# Patient Record
Sex: Female | Born: 1970
Health system: Southern US, Community
[De-identification: ages and names within clinical notes are randomized; demographics above are authoritative.]

## PROBLEM LIST (undated history)

## (undated) DIAGNOSIS — O88219 Thromboembolism in pregnancy, unspecified trimester: Secondary | ICD-10-CM

## (undated) DIAGNOSIS — R112 Nausea with vomiting, unspecified: Secondary | ICD-10-CM

## (undated) DIAGNOSIS — J9819 Other pulmonary collapse: Secondary | ICD-10-CM

## (undated) DIAGNOSIS — Z9889 Other specified postprocedural states: Secondary | ICD-10-CM

## (undated) DIAGNOSIS — I82409 Acute embolism and thrombosis of unspecified deep veins of unspecified lower extremity: Secondary | ICD-10-CM

## (undated) HISTORY — DX: Acute embolism and thrombosis of unspecified deep veins of unspecified lower extremity: I82.409

## (undated) HISTORY — DX: Thromboembolism in pregnancy, unspecified trimester: O88.219

## (undated) HISTORY — PX: RIB RESECTION: SHX5077

---

## 2001-08-14 ENCOUNTER — Encounter: Admission: RE | Admit: 2001-08-14 | Discharge: 2001-09-19 | Payer: Self-pay

## 2001-08-29 ENCOUNTER — Other Ambulatory Visit: Admission: RE | Admit: 2001-08-29 | Discharge: 2001-08-29 | Payer: Self-pay | Admitting: Unknown Physician Specialty

## 2002-01-30 ENCOUNTER — Emergency Department (HOSPITAL_COMMUNITY): Admission: EM | Admit: 2002-01-30 | Discharge: 2002-01-31 | Payer: Self-pay | Admitting: Emergency Medicine

## 2002-12-03 ENCOUNTER — Encounter: Payer: Self-pay | Admitting: Emergency Medicine

## 2002-12-03 ENCOUNTER — Emergency Department (HOSPITAL_COMMUNITY): Admission: EM | Admit: 2002-12-03 | Discharge: 2002-12-03 | Payer: Self-pay | Admitting: Emergency Medicine

## 2004-01-07 ENCOUNTER — Emergency Department (HOSPITAL_COMMUNITY): Admission: EM | Admit: 2004-01-07 | Discharge: 2004-01-07 | Payer: Self-pay | Admitting: Emergency Medicine

## 2004-05-01 ENCOUNTER — Emergency Department (HOSPITAL_COMMUNITY): Admission: EM | Admit: 2004-05-01 | Discharge: 2004-05-01 | Payer: Self-pay | Admitting: Emergency Medicine

## 2004-05-13 ENCOUNTER — Ambulatory Visit (HOSPITAL_COMMUNITY): Admission: RE | Admit: 2004-05-13 | Discharge: 2004-05-13 | Payer: Self-pay | Admitting: Internal Medicine

## 2004-06-16 ENCOUNTER — Ambulatory Visit (HOSPITAL_COMMUNITY): Admission: RE | Admit: 2004-06-16 | Discharge: 2004-06-16 | Payer: Self-pay | Admitting: Chiropractic Medicine

## 2011-03-09 ENCOUNTER — Emergency Department (HOSPITAL_COMMUNITY)
Admission: EM | Admit: 2011-03-09 | Discharge: 2011-03-09 | Disposition: A | Discharge status: Home / Self Care | Payer: Self-pay | Attending: Emergency Medicine | Admitting: Emergency Medicine

## 2011-03-09 DIAGNOSIS — J029 Acute pharyngitis, unspecified: Secondary | ICD-10-CM | POA: Insufficient documentation

## 2011-03-09 DIAGNOSIS — R509 Fever, unspecified: Secondary | ICD-10-CM | POA: Insufficient documentation

## 2011-03-09 LAB — RAPID STREP SCREEN (MED CTR MEBANE ONLY): Streptococcus, Group A Screen (Direct): NEGATIVE

## 2011-12-24 ENCOUNTER — Encounter (HOSPITAL_COMMUNITY): Payer: Self-pay

## 2011-12-24 ENCOUNTER — Emergency Department (HOSPITAL_COMMUNITY)
Admission: EM | Admit: 2011-12-24 | Discharge: 2011-12-24 | Disposition: A | Discharge status: Home / Self Care | Payer: Self-pay | Source: Home / Self Care

## 2011-12-24 ENCOUNTER — Emergency Department (INDEPENDENT_AMBULATORY_CARE_PROVIDER_SITE_OTHER): Payer: Self-pay

## 2011-12-24 DIAGNOSIS — S139XXA Sprain of joints and ligaments of unspecified parts of neck, initial encounter: Secondary | ICD-10-CM

## 2011-12-24 DIAGNOSIS — S161XXA Strain of muscle, fascia and tendon at neck level, initial encounter: Secondary | ICD-10-CM

## 2011-12-24 MED ORDER — PREDNISONE 10 MG PO TABS: ORAL_TABLET | ORAL | Status: DC

## 2011-12-24 MED ORDER — CYCLOBENZAPRINE HCL 10 MG PO TABS
10.0000 mg | ORAL_TABLET | Freq: Two times a day (BID) | ORAL | Status: AC | PRN
Start: 2011-12-24 — End: 2012-01-03

## 2011-12-24 MED ORDER — MELOXICAM 15 MG PO TABS: 15.0000 mg | ORAL_TABLET | Freq: Every day | ORAL | Status: DC

## 2011-12-24 MED ORDER — ACETAMINOPHEN 500 MG PO TABS: 500.0000 mg | ORAL_TABLET | Freq: Four times a day (QID) | ORAL | Status: AC | PRN
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
## 2011-12-24 NOTE — Discharge Instructions (Signed)
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
## 2012-02-22 ENCOUNTER — Emergency Department (HOSPITAL_COMMUNITY)
Admission: EM | Admit: 2012-02-22 | Discharge: 2012-02-22 | Disposition: A | Discharge status: Home / Self Care | Payer: Self-pay | Attending: Emergency Medicine | Admitting: Emergency Medicine

## 2012-02-22 ENCOUNTER — Encounter (HOSPITAL_COMMUNITY): Payer: Self-pay | Admitting: *Deleted

## 2012-02-22 DIAGNOSIS — N390 Urinary tract infection, site not specified: Secondary | ICD-10-CM | POA: Insufficient documentation

## 2012-02-22 LAB — URINALYSIS, ROUTINE W REFLEX MICROSCOPIC
Bilirubin Urine: NEGATIVE
Glucose, UA: NEGATIVE mg/dL
Ketones, ur: NEGATIVE mg/dL
Nitrite: NEGATIVE
Protein, ur: NEGATIVE mg/dL
Specific Gravity, Urine: 1.031 — ABNORMAL HIGH (ref 1.005–1.030)
Urobilinogen, UA: 1 mg/dL (ref 0.0–1.0)
pH: 6 (ref 5.0–8.0)

## 2012-02-22 LAB — URINE MICROSCOPIC-ADD ON

## 2012-02-22 LAB — WET PREP, GENITAL
Trich, Wet Prep: NONE SEEN
Yeast Wet Prep HPF POC: NONE SEEN

## 2012-02-22 MED ORDER — CEPHALEXIN 500 MG PO CAPS: 500.0000 mg | ORAL_CAPSULE | Freq: Four times a day (QID) | ORAL | Status: AC

## 2012-02-22 MED ORDER — CEFTRIAXONE SODIUM 1 G IJ SOLR
1.0000 g | Freq: Once | INTRAMUSCULAR | Status: AC
  Administered 2012-02-22: 1 g via INTRAMUSCULAR
  Filled 2012-02-22: qty 10

## 2012-02-22 MED ORDER — IBUPROFEN 800 MG PO TABS
800.0000 mg | ORAL_TABLET | Freq: Once | ORAL | Status: AC
  Administered 2012-02-22: 800 mg via ORAL
  Filled 2012-02-22: qty 1

## 2012-02-22 NOTE — ED Notes (Signed)
Per Pt:  Pt st's she's having urinary frequency and vaginal itching, also st's she's had some yellow discharge.  St's this has been going on for about 1 week.

## 2012-02-22 NOTE — ED Provider Notes (Signed)
History     CSN: [id number]  Arrival date & time 02/22/12  2021   First MD Initiated Contact with Patient 02/22/12 2103      No chief complaint on file.   (Consider location/radiation/quality/duration/timing/severity/associated sxs/prior treatment) HPI Comments: Patient with a significant past medical history presents emergency department with chief complaint of vaginal itching, dysuria, and urinary frequency.  Patient states her last menstrual period is due but denies being sexually active.  The history is provided by the patient.    History reviewed. No pertinent past medical history.  Past Surgical History  Procedure Date  . Rib resection     No family history on file.  History  Substance Use Topics  . Smoking status: Never Smoker   . Smokeless tobacco: Not on file  . Alcohol Use: Yes    OB History    Grav Para Term Preterm Abortions TAB SAB Ect Mult Living                  Review of Systems  Constitutional: Negative for fever, chills and appetite change.  HENT: Negative for congestion.   Eyes: Negative for visual disturbance.  Respiratory: Negative for shortness of breath.   Cardiovascular: Negative for chest pain and leg swelling.  Gastrointestinal: Negative for nausea, vomiting and abdominal pain.  Genitourinary: Positive for dysuria, urgency and frequency. Negative for vaginal bleeding, vaginal discharge, vaginal pain and dyspareunia.  Neurological: Negative for dizziness, syncope, weakness, light-headedness, numbness and headaches.  Psychiatric/Behavioral: Negative for confusion.  All other systems reviewed and are negative.    Allergies  Review of patient's allergies indicates no known allergies.  Home Medications  No current outpatient prescriptions on file.  There were no vitals taken for this visit.  Physical Exam  Nursing note and vitals reviewed. Constitutional: She is oriented to person, place, and time. She appears well-developed and  well-nourished. No distress.  HENT:  Head: Normocephalic and atraumatic.  Eyes: Conjunctivae and EOM are normal.  Neck: Normal range of motion.  Pulmonary/Chest: Effort normal.  Genitourinary:       Pelvic exam: normal external genitalia, vulva, vagina, cervix, uterus and adnexa.   Musculoskeletal: Normal range of motion.  Neurological: She is alert and oriented to person, place, and time.  Skin: Skin is warm and dry. No rash noted. She is not diaphoretic.  Psychiatric: She has a normal mood and affect. Her behavior is normal.    ED Course  Procedures (including critical care time)  Labs Reviewed  URINALYSIS, ROUTINE W REFLEX MICROSCOPIC - Abnormal; Notable for the following:    APPearance CLOUDY (*)     Specific Gravity, Urine 1.031 (*)     Hgb urine dipstick LARGE (*)     Leukocytes, UA MODERATE (*)     All other components within normal limits  WET PREP, GENITAL - Abnormal; Notable for the following:    Clue Cells Wet Prep HPF POC RARE (*)     WBC, Wet Prep HPF POC TOO NUMEROUS TO COUNT (*)     All other components within normal limits  URINE MICROSCOPIC-ADD ON - Abnormal; Notable for the following:    Squamous Epithelial / LPF FEW (*)     Bacteria, UA FEW (*)     All other components within normal limits  GC/CHLAMYDIA PROBE AMP, GENITAL   No results found.   No diagnosis found.    MDM  UTI  Pt has been diagnosed with a UTI. Pt is afebrile, no CVA tenderness, normotensive,  and denies N/V. Pt to be dc home with antibiotics and instructions to follow up with PCP if symptoms persist.         Jaci Carrel, PA-C 02/22/12 2307

## 2012-02-22 NOTE — ED Notes (Signed)
Pelvic setup in room 

## 2012-02-23 LAB — GC/CHLAMYDIA PROBE AMP, GENITAL
Chlamydia, DNA Probe: NEGATIVE
GC Probe Amp, Genital: NEGATIVE

## 2012-02-23 NOTE — ED Provider Notes (Signed)
Medical screening examination/treatment/procedure(s) were performed by non-physician practitioner and as supervising physician I was immediately available for consultation/collaboration.  Derwood Kaplan, MD 02/23/12 [phone]

## 2012-07-20 ENCOUNTER — Emergency Department (HOSPITAL_COMMUNITY): Payer: Self-pay

## 2012-07-20 ENCOUNTER — Emergency Department (HOSPITAL_COMMUNITY)
Admission: EM | Admit: 2012-07-20 | Discharge: 2012-07-20 | Disposition: A | Discharge status: Home / Self Care | Payer: Self-pay | Attending: Emergency Medicine | Admitting: Emergency Medicine

## 2012-07-20 ENCOUNTER — Encounter (HOSPITAL_COMMUNITY): Payer: Self-pay | Admitting: Emergency Medicine

## 2012-07-20 DIAGNOSIS — J189 Pneumonia, unspecified organism: Secondary | ICD-10-CM | POA: Insufficient documentation

## 2012-07-20 DIAGNOSIS — R079 Chest pain, unspecified: Secondary | ICD-10-CM | POA: Insufficient documentation

## 2012-07-20 LAB — CBC
MCV: 66.5 fL — ABNORMAL LOW (ref 78.0–100.0)
Platelets: 307 10*3/uL (ref 150–400)
RBC: 5.07 MIL/uL (ref 3.87–5.11)
RDW: 17.8 % — ABNORMAL HIGH (ref 11.5–15.5)
WBC: 7.4 10*3/uL (ref 4.0–10.5)

## 2012-07-20 LAB — BASIC METABOLIC PANEL WITH GFR
BUN: 11 mg/dL (ref 6–23)
CO2: 24 meq/L (ref 19–32)
Calcium: 8.9 mg/dL (ref 8.4–10.5)
Chloride: 104 meq/L (ref 96–112)
Creatinine, Ser: 0.87 mg/dL (ref 0.50–1.10)
GFR calc Af Amer: >90 mL/min (ref >90)
GFR calc non Af Amer: 82 mL/min — ABNORMAL LOW (ref >90)
Glucose, Bld: 85 mg/dL (ref 70–99)
Potassium: 3.8 meq/L (ref 3.5–5.1)
Sodium: 136 meq/L (ref 135–145)

## 2012-07-20 MED ORDER — SODIUM CHLORIDE 0.9 % IV BOLUS (SEPSIS)
1000.0000 mL | Freq: Once | INTRAVENOUS | Status: AC
  Administered 2012-07-20: 1000 mL via INTRAVENOUS

## 2012-07-20 MED ORDER — ALBUTEROL SULFATE (5 MG/ML) 0.5% IN NEBU
5.0000 mg | INHALATION_SOLUTION | Freq: Once | RESPIRATORY_TRACT | Status: AC
  Administered 2012-07-20: 5 mg via RESPIRATORY_TRACT
  Filled 2012-07-20: qty 1

## 2012-07-20 MED ORDER — ALBUTEROL SULFATE HFA 108 (90 BASE) MCG/ACT IN AERS
2.0000 | INHALATION_SPRAY | Freq: Once | RESPIRATORY_TRACT | Status: AC
  Administered 2012-07-20: 2 via RESPIRATORY_TRACT
  Filled 2012-07-20: qty 6.7

## 2012-07-20 MED ORDER — DEXTROSE 5 % IV SOLN
500.0000 mg | Freq: Once | INTRAVENOUS | Status: AC
  Administered 2012-07-20: 500 mg via INTRAVENOUS
  Filled 2012-07-20: qty 500

## 2012-07-20 MED ORDER — AZITHROMYCIN 250 MG PO TABS: ORAL_TABLET | ORAL | Status: DC

## 2012-07-20 MED ORDER — ONDANSETRON HCL 4 MG/2ML IJ SOLN
4.0000 mg | Freq: Once | INTRAMUSCULAR | Status: AC
  Administered 2012-07-20: 4 mg via INTRAVENOUS
  Filled 2012-07-20: qty 2

## 2012-07-20 MED ORDER — MORPHINE SULFATE 4 MG/ML IJ SOLN
6.0000 mg | Freq: Once | INTRAMUSCULAR | Status: AC
  Administered 2012-07-20: 6 mg via INTRAVENOUS
  Filled 2012-07-20: qty 2

## 2012-07-20 MED ORDER — PREDNISONE 20 MG PO TABS: ORAL_TABLET | ORAL | Status: DC

## 2012-07-20 MED ORDER — DEXTROSE 5 % IV SOLN
1.0000 g | Freq: Once | INTRAVENOUS | Status: AC
  Administered 2012-07-20: 1 g via INTRAVENOUS
  Filled 2012-07-20: qty 10

## 2012-07-20 MED ORDER — OXYCODONE-ACETAMINOPHEN 5-325 MG PO TABS: ORAL_TABLET | ORAL | Status: DC

## 2012-07-20 MED ORDER — IOHEXOL 350 MG/ML SOLN
100.0000 mL | Freq: Once | INTRAVENOUS | Status: AC | PRN
  Administered 2012-07-20: 100 mL via INTRAVENOUS

## 2012-07-20 NOTE — ED Provider Notes (Signed)
History     CSN: [id number]  Arrival date & time 07/20/12  1606   First MD Initiated Contact with Patient 07/20/12 1706      Chief Complaint  Patient presents with  . Shortness of Breath    (Consider location/radiation/quality/duration/timing/severity/associated sxs/prior treatment) HPI  Donna [name] is a 42 y.o. female complaining of shortness of breath and right anterior chest pain onset yesterday morning. Patient denies cough, fever, abdominal pain, nausea vomiting, change in bowel or bladder habits. Patient denies taking any exogenous estrogen, recent immobilization. Pain is rated at 7/10 described as sharp and exacerbated by deep breathing and movement.   History reviewed. No pertinent past medical history.  Past Surgical History  Procedure Date  . Rib resection     History reviewed. No pertinent family history.  History  Substance Use Topics  . Smoking status: Never Smoker   . Smokeless tobacco: Not on file  . Alcohol Use: Yes    OB History    Grav Para Term Preterm Abortions TAB SAB Ect Mult Living                  Review of Systems  Constitutional: Negative for fever.  Respiratory: Positive for shortness of breath.   Cardiovascular: Positive for chest pain.  Gastrointestinal: Negative for nausea, vomiting, abdominal pain and diarrhea.  All other systems reviewed and are negative.    Allergies  Review of patient's allergies indicates no known allergies.  Home Medications   Current Outpatient Rx  Name  Route  Sig  Dispense  Refill  . IBUPROFEN 200 MG PO TABS   Oral   Take 200 mg by mouth every 6 (six) hours as needed. Pain         . PSEUDOEPHEDRINE-ACETAMINOPHEN 30-500 MG PO TABS   Oral   Take 1 tablet by mouth every 4 (four) hours as needed. Congestion           BP 132/74  Pulse 89  Temp 98.4 F (36.9 C) (Oral)  Resp 20  Ht 5\' 10"  (1.778 m)  Wt 266 lb 6.4 oz (120.838 kg)  BMI 38.22 kg/m2  SpO2 100%  LMP 06/24/2012  Physical  Exam  Nursing note and vitals reviewed. Constitutional: She is oriented to person, place, and time. She appears well-developed and well-nourished. No distress.       Obese   HENT:  Head: Normocephalic.  Eyes: Conjunctivae normal and EOM are normal.  Neck: Normal range of motion.  Cardiovascular:       Tachy to the 120's  Pulmonary/Chest: Effort normal and breath sounds normal. No stridor. No respiratory distress. She has no wheezes. She has no rales. She exhibits no tenderness.  Abdominal: Soft. There is no tenderness.  Musculoskeletal: Normal range of motion.  Neurological: She is alert and oriented to person, place, and time.  Psychiatric: She has a normal mood and affect.    ED Course  Procedures (including critical care time)  Labs Reviewed  BASIC METABOLIC PANEL - Abnormal; Notable for the following:    GFR calc non Af Amer 82 (*)     All other components within normal limits  CBC - Abnormal; Notable for the following:    Hemoglobin 10.5 (*)     HCT 33.7 (*)     MCV 66.5 (*)     MCH 20.7 (*)     RDW 17.8 (*)     All other components within normal limits  D-DIMER, QUANTITATIVE - Abnormal; Notable for  the following:    D-Dimer, Quant 1.39 (*)     All other components within normal limits   Dg Chest 2 View (if Patient Has Fever And/or Copd)  07/20/2012  *RADIOLOGY REPORT*  Clinical Data: Left-sided chest pain.  Shortness of breath.  Prior history of left first rib resection and spontaneous pneumothorax approximate 10 years ago.  CHEST - 2 VIEW  Comparison: Two-view chest x-ray 01/07/2004.  Findings: Cardiomediastinal silhouette unremarkable, unchanged. Streaky opacities in the lower lobes and right middle lobe.  Lungs otherwise clear.  Surgically absent first rib.  Visualized bony thorax otherwise intact.  IMPRESSION: Streaky atelectasis versus bronchopneumonia involving the lower lobes and right middle lobe.   Original Report Authenticated By: Hulan Saas, M.D.    Ct  Angio Chest Pe W/cm &/or Wo Cm  07/20/2012  *RADIOLOGY REPORT*  Clinical Data: Short of breath with right chest pain.  CT ANGIOGRAPHY CHEST  Technique:  Multidetector CT imaging of the chest using the standard protocol during bolus administration of intravenous contrast. Multiplanar reconstructed images including MIPs were obtained and reviewed to evaluate the vascular anatomy.  Contrast: OMNIPAQUE IOHEXOL 350 MG/ML SOLN  Comparison: Chest x-ray 08/09/2012  Findings: Right lower lobe infiltrate, likely pneumonia.  Small right pleural effusion.  The left lung is clear.  Negative for pulmonary embolism.  There is gas in the main pulmonary artery from recent IV start. The aorta is not well opacified to evaluate for dissection.  No aneurysm is present.  Heart size is normal and there is no pericardial effusion.  IMPRESSION: Right lower lobe infiltrate and effusion consistent with pneumonia.  Negative for pulmonary embolism.   Original Report Authenticated By: Janeece Riggers, M.D.      1. Community acquired pneumonia       MDM  Patient with acute onset of chest pain and shortness of breath. She denies fever, cough or other signs of infection. D-dimer is ordered to rule out PE  Chest x-ray shows streaky atelectasis versus bronchopneumonia. D-dimer is elevated. CTA is ordered.  His CTA shows no pulmonary embolism I will treat her for pneumonia with IV Rocephin and azithromycin.    Pt verbalized understanding and agrees with care plan. Outpatient follow-up and return precautions given.    New Prescriptions   AZITHROMYCIN (ZITHROMAX Z-PAK) 250 MG TABLET    2 po day one, then 1 daily x 4 days   OXYCODONE-ACETAMINOPHEN (PERCOCET/ROXICET) 5-325 MG PER TABLET    1 to 2 tabs PO q6hrs  PRN for pain   PREDNISONE (DELTASONE) 20 MG TABLET    2 tabs po daily x 4 days           Wynetta Emery, PA-C 07/22/12 2140

## 2012-07-20 NOTE — ED Notes (Signed)
Patient c/o shortness of breath since yesterday morning.  Patient c/o right sided rib cage and shortness of breath.  Patient denies fevers or cough.

## 2012-07-20 NOTE — ED Notes (Signed)
[id number]> Expected date:<BR> Expected time:<BR> Means of arrival:<BR> Comments:<BR> EMS - [place] pt just discharged

## 2012-07-23 NOTE — ED Provider Notes (Signed)
Medical screening examination/treatment/procedure(s) were performed by non-physician practitioner and as supervising physician I was immediately available for consultation/collaboration.    Celene Kras, MD 07/23/12 [phone]

## 2012-12-04 ENCOUNTER — Encounter (HOSPITAL_COMMUNITY): Payer: Self-pay | Admitting: *Deleted

## 2012-12-04 ENCOUNTER — Emergency Department (HOSPITAL_COMMUNITY): Payer: Self-pay

## 2012-12-04 ENCOUNTER — Inpatient Hospital Stay (HOSPITAL_COMMUNITY)
Admission: EM | Admit: 2012-12-04 | Discharge: 2012-12-11 | DRG: 176 | Disposition: A | Discharge status: Home / Self Care | Payer: MEDICAID | Attending: Internal Medicine | Admitting: Internal Medicine

## 2012-12-04 DIAGNOSIS — R894 Abnormal immunological findings in specimens from other organs, systems and tissues: Secondary | ICD-10-CM | POA: Diagnosis present

## 2012-12-04 DIAGNOSIS — I82401 Acute embolism and thrombosis of unspecified deep veins of right lower extremity: Secondary | ICD-10-CM

## 2012-12-04 DIAGNOSIS — D649 Anemia, unspecified: Secondary | ICD-10-CM | POA: Diagnosis present

## 2012-12-04 DIAGNOSIS — D509 Iron deficiency anemia, unspecified: Secondary | ICD-10-CM | POA: Diagnosis present

## 2012-12-04 DIAGNOSIS — S93401A Sprain of unspecified ligament of right ankle, initial encounter: Secondary | ICD-10-CM

## 2012-12-04 DIAGNOSIS — M249 Joint derangement, unspecified: Secondary | ICD-10-CM | POA: Diagnosis present

## 2012-12-04 DIAGNOSIS — I82409 Acute embolism and thrombosis of unspecified deep veins of unspecified lower extremity: Secondary | ICD-10-CM | POA: Diagnosis present

## 2012-12-04 DIAGNOSIS — Z86711 Personal history of pulmonary embolism: Secondary | ICD-10-CM | POA: Diagnosis present

## 2012-12-04 DIAGNOSIS — S93401D Sprain of unspecified ligament of right ankle, subsequent encounter: Secondary | ICD-10-CM

## 2012-12-04 DIAGNOSIS — M24176 Other articular cartilage disorders, unspecified foot: Secondary | ICD-10-CM | POA: Diagnosis present

## 2012-12-04 DIAGNOSIS — I2699 Other pulmonary embolism without acute cor pulmonale: Principal | ICD-10-CM | POA: Diagnosis present

## 2012-12-04 HISTORY — DX: Other pulmonary collapse: J98.19

## 2012-12-04 LAB — POCT I-STAT TROPONIN I: Troponin i, poc: 0 ng/mL (ref 0.00–0.08)

## 2012-12-04 NOTE — ED Notes (Signed)
Pt c/o shortness of breath x 2 days; worse yesterday; pain with breathing; feels like can't breathe when she is laying down; denies cough; pain to left axilla area when breathing; no fever; pt fell Saturday night hurting right ankle; swelling; pain with ambulation

## 2012-12-04 NOTE — ED Provider Notes (Signed)
History     CSN: [id number]  Arrival date & time 12/04/12  2050   First MD Initiated Contact with Patient 12/04/12 2336      Chief Complaint  Patient presents with  . Shortness of Breath    (Consider location/radiation/quality/duration/timing/severity/associated sxs/prior treatment) HPI Comments: Patient comes to the ER for evaluation of left-sided chest pain and shortness of breath. Symptoms began 2 days ago. Patient reports that she has a sharp pain in the left ribs that hurts when she moves, bends, twists as well as takes a deep breath. This is causing her to feel short of breath. She says it feels like when she had pneumonia in the past. She has not had any fever and there is minimal cough.  She does report a fall 4 nights ago. She injured her right ankle when she fell. She reports swelling and pain with walking. She does not think there was any other injury, including the chest.  Patient is a 42 y.o. female presenting with shortness of breath.  Shortness of Breath Associated symptoms: chest pain     Past Medical History  Diagnosis Date  . Lung collapse     Past Surgical History  Procedure Laterality Date  . Rib resection      No family history on file.  History  Substance Use Topics  . Smoking status: Never Smoker   . Smokeless tobacco: Not on file  . Alcohol Use: Yes    OB History   Grav Para Term Preterm Abortions TAB SAB Ect Mult Living                  Review of Systems  Respiratory: Positive for shortness of breath.   Cardiovascular: Positive for chest pain.  Musculoskeletal: Positive for arthralgias.  All other systems reviewed and are negative.    Allergies  Review of patient's allergies indicates no known allergies.  Home Medications   Current Outpatient Rx  Name  Route  Sig  Dispense  Refill  . azithromycin (ZITHROMAX Z-PAK) 250 MG tablet      2 po day one, then 1 daily x 4 days   5 tablet   0   . ibuprofen (ADVIL,MOTRIN) 200 MG  tablet   Oral   Take 200 mg by mouth every 6 (six) hours as needed. Pain         . oxyCODONE-acetaminophen (PERCOCET/ROXICET) 5-325 MG per tablet      1 to 2 tabs PO q6hrs  PRN for pain   15 tablet   0   . predniSONE (DELTASONE) 20 MG tablet      2 tabs po daily x 4 days   8 tablet   0   . pseudoephedrine-acetaminophen (TYLENOL SINUS) 30-500 MG TABS   Oral   Take 1 tablet by mouth every 4 (four) hours as needed. Congestion           BP 111/62  Pulse 107  Temp(Src) 99.2 F (37.3 C)  Resp 24  SpO2 100%  LMP 11/14/2012  Physical Exam  Constitutional: She is oriented to person, place, and time. She appears well-developed and well-nourished. No distress.  HENT:  Head: Normocephalic and atraumatic.  Right Ear: Hearing normal.  Left Ear: Hearing normal.  Nose: Nose normal.  Mouth/Throat: Oropharynx is clear and moist and mucous membranes are normal.  Eyes: Conjunctivae and EOM are normal. Pupils are equal, round, and reactive to light.  Neck: Normal range of motion. Neck supple.  Cardiovascular: Regular rhythm, S1  normal and S2 normal.  Exam reveals no gallop and no friction rub.   No murmur heard. Pulmonary/Chest: Effort normal and breath sounds normal. No respiratory distress. She exhibits tenderness. She exhibits no crepitus.    Abdominal: Soft. Normal appearance and bowel sounds are normal. There is no hepatosplenomegaly. There is no tenderness. There is no rebound, no guarding, no tenderness at McBurney's point and negative Murphy's sign. No hernia.  Musculoskeletal: Normal range of motion.       Right ankle: She exhibits swelling. She exhibits no deformity. Tenderness. Lateral malleolus tenderness found.  Neurological: She is alert and oriented to person, place, and time. She has normal strength. No cranial nerve deficit or sensory deficit. Coordination normal. GCS eye subscore is 4. GCS verbal subscore is 5. GCS motor subscore is 6.  Skin: Skin is warm, dry and  intact. No rash noted. No cyanosis.  Psychiatric: She has a normal mood and affect. Her speech is normal and behavior is normal. Thought content normal.    ED Course  Procedures (including critical care time)  Labs Reviewed  POCT I-STAT TROPONIN I   Dg Chest 2 View  12/04/2012   *RADIOLOGY REPORT*  Clinical Data: Left-sided chest pain and shortness of breath.  CHEST - 2 VIEW  Comparison: 07/20/2012.  Findings: The cardiac silhouette, mediastinal and hilar contours are normal and stable.  Mild bronchitic lung changes and left greater than right basilar atelectasis.  No focal airspace consolidation or pleural effusion.  IMPRESSION: Bronchitic type lung changes and bibasilar atelectasis, left greater than right.   Original Report Authenticated By: Rudie Meyer, M.D.   Dg Ankle Complete Right  12/04/2012   *RADIOLOGY REPORT*  Clinical Data: Injured right ankle.  RIGHT ANKLE - COMPLETE 3+ VIEW  Comparison: None  Findings: The ankle mortise is maintained.  No acute ankle fracture.  No osteochondral lesion.  No joint effusion.  The mid and hind foot bony structures are intact.  IMPRESSION: No acute bony findings.   Original Report Authenticated By: Rudie Meyer, M.D.   Ct Angio Chest Pe W/cm &/or Wo Cm  12/05/2012   *RADIOLOGY REPORT*  Clinical Data: Chest pain and shortness of breath.  CT ANGIOGRAPHY CHEST  Technique:  Multidetector CT imaging of the chest using the standard protocol during bolus administration of intravenous contrast. Multiplanar reconstructed images including MIPs were obtained and reviewed to evaluate the vascular anatomy.  Contrast: OMNIPAQUE IOHEXOL 350 MG/ML SOLN  Comparison: CT chest 07/20/2012.  Findings: The chest wall is unremarkable and stable.  No breast masses, supraclavicular or axillary adenopathy.  Small scattered benign appearing fatty lymph nodes are noted.  The bony thorax is intact.  The heart is normal in size.  No pericardial effusion.  No mediastinal or hilar  lymphadenopathy.  Small scattered lymph nodes are noted.  The esophagus is grossly normal. A small hiatal hernia is noted.  The aorta is normal in caliber. No dissection.  The pulmonary arterial tree is fairly well opacified.  There are bilateral pulmonary emboli.  Examination of the lung parenchyma demonstrates patchy subsegmental bibasilar atelectasis.  No worrisome mass lesions or bronchiectasis.  The upper abdomen is unremarkable.  IMPRESSION:  1.  Small bilateral pulmonary emboli. 2.  Normal thoracic aorta. 3.  Patchy bibasilar subsegmental atelectasis.   Original Report Authenticated By: Rudie Meyer, M.D.     Diagnosis: 1. Bilateral PE 2. Ankle sprain    MDM  Patient comes to the ER for evaluation of 2 separate problems. Patient reports  that she has been experiencing shortness of breath for the last 2 days. She reports the pain is on the left side under the breast and hurts more when she takes a deep breath. He feels worse when she lies down. She has not had any cough. Patient denies fever. Workup is consistent with bilateral pulmonary emboli causing the symptoms.  Patient also complaining of right ankle injury from a fall. She fell 4 nights ago. Patient twisted her right ankle when she fell. She still has some swelling outside portion of the ankle. X-ray was negative.  Patient be admitted to the hospital for initial anticoagulation treatment for bilateral PE.        Gilda Crease, MD 12/05/12 [phone]

## 2012-12-05 ENCOUNTER — Encounter (HOSPITAL_COMMUNITY): Payer: Self-pay

## 2012-12-05 ENCOUNTER — Emergency Department (HOSPITAL_COMMUNITY): Payer: Self-pay

## 2012-12-05 DIAGNOSIS — Z86711 Personal history of pulmonary embolism: Secondary | ICD-10-CM | POA: Diagnosis present

## 2012-12-05 DIAGNOSIS — I2699 Other pulmonary embolism without acute cor pulmonale: Secondary | ICD-10-CM

## 2012-12-05 DIAGNOSIS — S93401A Sprain of unspecified ligament of right ankle, initial encounter: Secondary | ICD-10-CM

## 2012-12-05 DIAGNOSIS — S93409A Sprain of unspecified ligament of unspecified ankle, initial encounter: Secondary | ICD-10-CM

## 2012-12-05 DIAGNOSIS — D649 Anemia, unspecified: Secondary | ICD-10-CM | POA: Diagnosis present

## 2012-12-05 LAB — POCT I-STAT, CHEM 8
BUN: 16 mg/dL (ref 6–23)
Calcium, Ion: 1.15 mmol/L (ref 1.12–1.23)
HCT: 32 % — ABNORMAL LOW (ref 36.0–46.0)
Hemoglobin: 10.9 g/dL — ABNORMAL LOW (ref 12.0–15.0)
Sodium: 136 mEq/L (ref 135–145)
TCO2: 24 mmol/L (ref 0–100)

## 2012-12-05 LAB — PROTIME-INR
INR: 1.07 (ref 0.00–1.49)
Prothrombin Time: 13.8 seconds (ref 11.6–15.2)

## 2012-12-05 LAB — COMPREHENSIVE METABOLIC PANEL
AST: 8 U/L (ref 0–37)
Albumin: 2.4 g/dL — ABNORMAL LOW (ref 3.5–5.2)
BUN: 10 mg/dL (ref 6–23)
Calcium: 8.2 mg/dL — ABNORMAL LOW (ref 8.4–10.5)
Creatinine, Ser: 0.87 mg/dL (ref 0.50–1.10)
GFR calc non Af Amer: 82 mL/min — ABNORMAL LOW (ref >90)

## 2012-12-05 LAB — RETICULOCYTES: Retic Ct Pct: 1.7 % (ref 0.4–3.1)

## 2012-12-05 LAB — HEPARIN LEVEL (UNFRACTIONATED)
Heparin Unfractionated: 0.2 IU/mL — ABNORMAL LOW (ref 0.30–0.70)
Heparin Unfractionated: 0.26 IU/mL — ABNORMAL LOW (ref 0.30–0.70)

## 2012-12-05 LAB — ANTITHROMBIN III: AntiThromb III Func: 77 % (ref 75–120)

## 2012-12-05 LAB — HOMOCYSTEINE: Homocysteine: 5.1 umol/L (ref 4.0–15.4)

## 2012-12-05 LAB — CBC
Hemoglobin: 8.3 g/dL — ABNORMAL LOW (ref 12.0–15.0)
MCH: 19.3 pg — ABNORMAL LOW (ref 26.0–34.0)
MCV: 63.5 fL — ABNORMAL LOW (ref 78.0–100.0)
RBC: 4.3 MIL/uL (ref 3.87–5.11)
WBC: 8.8 10*3/uL (ref 4.0–10.5)

## 2012-12-05 LAB — POCT I-STAT TROPONIN I: Troponin i, poc: 0.01 ng/mL (ref 0.00–0.08)

## 2012-12-05 LAB — APTT: aPTT: 32 seconds (ref 24–37)

## 2012-12-05 LAB — PRO B NATRIURETIC PEPTIDE: Pro B Natriuretic peptide (BNP): 38.4 pg/mL (ref 0–125)

## 2012-12-05 LAB — FERRITIN: Ferritin: 10 ng/mL (ref 10–291)

## 2012-12-05 MED ORDER — SODIUM CHLORIDE 0.9 % IV SOLN
INTRAVENOUS | Status: DC
  Administered 2012-12-05: 07:00:00 via INTRAVENOUS

## 2012-12-05 MED ORDER — COUMADIN BOOK
Freq: Once | Status: AC
  Administered 2012-12-05: 16:00:00
  Filled 2012-12-05 (×2): qty 1

## 2012-12-05 MED ORDER — HEPARIN (PORCINE) IN NACL 100-0.45 UNIT/ML-% IJ SOLN
1400.0000 [IU]/h | INTRAMUSCULAR | Status: DC
  Administered 2012-12-05: 1400 [IU]/h via INTRAVENOUS
  Filled 2012-12-05 (×2): qty 250

## 2012-12-05 MED ORDER — HEPARIN BOLUS VIA INFUSION
5000.0000 [IU] | Freq: Once | INTRAVENOUS | Status: AC
  Administered 2012-12-05: 5000 [IU] via INTRAVENOUS
  Filled 2012-12-05: qty 5000

## 2012-12-05 MED ORDER — HYDROCODONE-ACETAMINOPHEN 5-325 MG PO TABS
1.0000 | ORAL_TABLET | ORAL | Status: DC | PRN
  Administered 2012-12-05: 1 via ORAL
  Administered 2012-12-05 – 2012-12-06 (×4): 2 via ORAL
  Filled 2012-12-05 (×4): qty 2
  Filled 2012-12-05: qty 1
  Filled 2012-12-05: qty 2

## 2012-12-05 MED ORDER — SODIUM CHLORIDE 0.9 % IV SOLN
INTRAVENOUS | Status: DC
  Administered 2012-12-05: 05:00:00 via INTRAVENOUS

## 2012-12-05 MED ORDER — HEPARIN (PORCINE) IN NACL 100-0.45 UNIT/ML-% IJ SOLN
1650.0000 [IU]/h | INTRAMUSCULAR | Status: AC
  Administered 2012-12-05 (×2): 1650 [IU]/h via INTRAVENOUS
  Filled 2012-12-05 (×2): qty 250

## 2012-12-05 MED ORDER — WARFARIN VIDEO
Freq: Once | Status: AC
  Administered 2012-12-05: 1

## 2012-12-05 MED ORDER — SODIUM CHLORIDE 0.9 % IJ SOLN
3.0000 mL | Freq: Two times a day (BID) | INTRAMUSCULAR | Status: DC
  Administered 2012-12-05 – 2012-12-11 (×12): 3 mL via INTRAVENOUS

## 2012-12-05 MED ORDER — ACETAMINOPHEN 325 MG PO TABS: 650.0000 mg | ORAL_TABLET | Freq: Four times a day (QID) | ORAL | Status: DC | PRN

## 2012-12-05 MED ORDER — ALBUTEROL SULFATE (5 MG/ML) 0.5% IN NEBU: 2.5000 mg | INHALATION_SOLUTION | RESPIRATORY_TRACT | Status: DC | PRN

## 2012-12-05 MED ORDER — HEPARIN (PORCINE) IN NACL 100-0.45 UNIT/ML-% IJ SOLN
1900.0000 [IU]/h | INTRAMUSCULAR | Status: AC
  Administered 2012-12-06: 1900 [IU]/h via INTRAVENOUS
  Filled 2012-12-05: qty 250

## 2012-12-05 MED ORDER — ONDANSETRON HCL 4 MG PO TABS: 4.0000 mg | ORAL_TABLET | Freq: Four times a day (QID) | ORAL | Status: DC | PRN

## 2012-12-05 MED ORDER — ACETAMINOPHEN 650 MG RE SUPP: 650.0000 mg | Freq: Four times a day (QID) | RECTAL | Status: DC | PRN

## 2012-12-05 MED ORDER — ONDANSETRON HCL 4 MG/2ML IJ SOLN
4.0000 mg | Freq: Four times a day (QID) | INTRAMUSCULAR | Status: DC | PRN
  Filled 2012-12-05: qty 2

## 2012-12-05 MED ORDER — WARFARIN - PHARMACIST DOSING INPATIENT: Freq: Every day | Status: DC

## 2012-12-05 MED ORDER — HEPARIN BOLUS VIA INFUSION
1500.0000 [IU] | Freq: Once | INTRAVENOUS | Status: AC
  Administered 2012-12-05: 1500 [IU] via INTRAVENOUS
  Filled 2012-12-05: qty 1500

## 2012-12-05 MED ORDER — WARFARIN SODIUM 10 MG PO TABS
10.0000 mg | ORAL_TABLET | Freq: Once | ORAL | Status: AC
  Administered 2012-12-05: 10 mg via ORAL
  Filled 2012-12-05: qty 1

## 2012-12-05 MED ORDER — KETOROLAC TROMETHAMINE 30 MG/ML IJ SOLN
30.0000 mg | Freq: Once | INTRAMUSCULAR | Status: AC
  Administered 2012-12-05: 30 mg via INTRAVENOUS
  Filled 2012-12-05: qty 1

## 2012-12-05 MED ORDER — IOHEXOL 350 MG/ML SOLN
100.0000 mL | Freq: Once | INTRAVENOUS | Status: AC | PRN
  Administered 2012-12-05: 100 mL via INTRAVENOUS

## 2012-12-05 NOTE — Progress Notes (Signed)
Doppler results texted to Dr. Brien Few.

## 2012-12-05 NOTE — Progress Notes (Signed)
  Echocardiogram 2D Echocardiogram has been performed.  Donna [name] 12/05/2012, 2:24 PM

## 2012-12-05 NOTE — H&P (Signed)
[facility] History and Physical  Donna [name] WUJ:[id number] DOB: 24-Jul-1970 DOA: 12/04/2012  Referring physician: ER physician PCP: No primary provider on file.   Chief Complaint: shortness of breath  HPI:  42 year old female with no significant past medical history who presented to [facility] ED with sudden onset chest pain, left sided and radiating to back, 10/10 in intensity, started few days prior to this admission. Patient reported pain was aggravated with breathing and exertion. No fevers or chills. She does have intermittent cough. No palpitations. No abdominal pain, no nausea or vomiting. No lightheadedness or loss of consciousness. In ED, vital are stable and O2 saturation is 95%. CXR showed bronchitic changes and CT angio chest revealed small bilateral pulmonary emboli. Patient started on heparin in ED.  Assessment and Plan:  Principal Problem:   Pulmonary embolism - unclear etiology - started heparin and coumadin per pharmacy - hypercoagulable work up may be done later not at this time due to acute pulmonary emboli  Manson Passey [facility] [phone]  Review of Systems:  Constitutional: Negative for fever, chills and malaise/fatigue. Negative for diaphoresis.  HENT: Negative for hearing loss, ear pain, nosebleeds, congestion, sore throat, neck pain, tinnitus and ear discharge.   Eyes: Negative for blurred vision, double vision, photophobia, pain, discharge and redness.  Respiratory: positive for cough, shortness of breath, no wheezing and stridor.   Cardiovascular: positive for chest pain, no palpitations, orthopnea, claudication and leg swelling.  Gastrointestinal: Negative for nausea, vomiting and abdominal pain. Negative for heartburn, constipation, blood in stool and melena.  Genitourinary: Negative for dysuria, urgency, frequency, hematuria and flank pain.  Musculoskeletal: Negative for myalgias, back pain, joint pain and falls.  Skin: Negative for itching and rash.   Neurological: Negative for dizziness and weakness. Negative for tingling, tremors, sensory change, speech change, focal weakness, loss of consciousness and headaches.  Endo/Heme/Allergies: Negative for environmental allergies and polydipsia. Does not bruise/bleed easily.  Psychiatric/Behavioral: Negative for suicidal ideas. The patient is not nervous/anxious.      Past Medical History  Diagnosis Date  . Lung collapse    Past Surgical History  Procedure Laterality Date  . Rib resection     Social History:  reports that she has never smoked. She does not have any smokeless tobacco history on file. She reports that  drinks alcohol. She reports that she does not use illicit drugs.  No Known Allergies  Family History: Family medical history significant for HTN, HLD   Prior to Admission medications   Medication Sig Start Date End Date Taking? Authorizing Provider  ibuprofen (ADVIL,MOTRIN) 200 MG tablet Take 200-400 mg by mouth every 6 (six) hours as needed for pain or headache. Pain   Yes Historical Provider, MD  naproxen sodium (ANAPROX) 220 MG tablet Take 220 mg by mouth 2 (two) times daily as needed (pain).   Yes Historical Provider, MD  simethicone (MYLICON) 80 MG chewable tablet Chew 80 mg by mouth every 6 (six) hours as needed for flatulence.   Yes Historical Provider, MD   Physical Exam: Filed Vitals:   12/04/12 2103 12/04/12 2106 12/05/12 0252  BP:  111/62 112/69  Pulse: 107  82  Temp: 99.2 F (37.3 C)  99 F (37.2 C)  TempSrc:   Oral  Resp: 24  18  SpO2: 100%  95%    Physical Exam  Constitutional: Appears well-developed and well-nourished. No distress.  HENT: Normocephalic. External right and left ear normal. Oropharynx is clear and moist.  Eyes: Conjunctivae and  EOM are normal. PERRLA, no scleral icterus.  Neck: Normal ROM. Neck supple. No JVD. No tracheal deviation. No thyromegaly.  CVS: RRR, S1/S2 +, no murmurs, no gallops, no carotid bruit.  Pulmonary: Effort and  breath sounds normal, no stridor, rhonchi, wheezes, rales.  Abdominal: Soft. BS +,  no distension, tenderness, rebound or guarding.  Musculoskeletal: Normal range of motion. No edema and no tenderness.  Lymphadenopathy: No lymphadenopathy noted, cervical, inguinal. Neuro: Alert. Normal reflexes, muscle tone coordination. No cranial nerve deficit. Skin: Skin is warm and dry. No rash noted. Not diaphoretic. No erythema. No pallor.  Psychiatric: Normal mood and affect. Behavior, judgment, thought content normal.   Labs on Admission:  Basic Metabolic Panel:  Recent Labs Lab 12/05/12 0047  NA 136  K 5.0  CL 106  GLUCOSE 90  BUN 16  CREATININE 0.90   Liver Function Tests: No results found for this basename: AST, ALT, ALKPHOS, BILITOT, PROT, ALBUMIN,  in the last 168 hours No results found for this basename: LIPASE, AMYLASE,  in the last 168 hours No results found for this basename: AMMONIA,  in the last 168 hours CBC:  Recent Labs Lab 12/05/12 0047  HGB 10.9*  HCT 32.0*   Cardiac Enzymes: No results found for this basename: CKTOTAL, CKMB, CKMBINDEX, TROPONINI,  in the last 168 hours BNP: No components found with this basename: POCBNP,  CBG: No results found for this basename: GLUCAP,  in the last 168 hours  Radiological Exams on Admission: Dg Chest 2 View  12/04/2012   *RADIOLOGY REPORT*  Clinical Data: Left-sided chest pain and shortness of breath.  CHEST - 2 VIEW  Comparison: 07/20/2012.  Findings: The cardiac silhouette, mediastinal and hilar contours are normal and stable.  Mild bronchitic lung changes and left greater than right basilar atelectasis.  No focal airspace consolidation or pleural effusion.  IMPRESSION: Bronchitic type lung changes and bibasilar atelectasis, left greater than right.   Original Report Authenticated By: Rudie Meyer, M.D.   Dg Ankle Complete Right  12/04/2012   *RADIOLOGY REPORT*  Clinical Data: Injured right ankle.  RIGHT ANKLE - COMPLETE 3+  VIEW  Comparison: None  Findings: The ankle mortise is maintained.  No acute ankle fracture.  No osteochondral lesion.  No joint effusion.  The mid and hind foot bony structures are intact.  IMPRESSION: No acute bony findings.   Original Report Authenticated By: Rudie Meyer, M.D.   Ct Angio Chest Pe W/cm &/or Wo Cm  12/05/2012   *RADIOLOGY REPORT*  Clinical Data: Chest pain and shortness of breath.  CT ANGIOGRAPHY CHEST  Technique:  Multidetector CT imaging of the chest using the standard protocol during bolus administration of intravenous contrast. Multiplanar reconstructed images including MIPs were obtained and reviewed to evaluate the vascular anatomy.  Contrast: OMNIPAQUE IOHEXOL 350 MG/ML SOLN  Comparison: CT chest 07/20/2012.  Findings: The chest wall is unremarkable and stable.  No breast masses, supraclavicular or axillary adenopathy.  Small scattered benign appearing fatty lymph nodes are noted.  The bony thorax is intact.  The heart is normal in size.  No pericardial effusion.  No mediastinal or hilar lymphadenopathy.  Small scattered lymph nodes are noted.  The esophagus is grossly normal. A small hiatal hernia is noted.  The aorta is normal in caliber. No dissection.  The pulmonary arterial tree is fairly well opacified.  There are bilateral pulmonary emboli.  Examination of the lung parenchyma demonstrates patchy subsegmental bibasilar atelectasis.  No worrisome mass lesions or bronchiectasis.  The upper abdomen is unremarkable.  IMPRESSION:  1.  Small bilateral pulmonary emboli. 2.  Normal thoracic aorta. 3.  Patchy bibasilar subsegmental atelectasis.   Original Report Authenticated By: Rudie Meyer, M.D.    EKG: Normal sinus rhythm, no ST/T wave changes  Code Status: Full Family Communication: Pt at bedside Disposition Plan: Admit for further evaluation  Manson Passey, MD  [facility] Pager [phone]  If 7PM-7AM, please contact night-coverage [email or web address] Password [facility] 12/05/2012,  4:36 AM

## 2012-12-05 NOTE — Progress Notes (Signed)
[facility] PROGRESS NOTE  Donna [name] AOZ:[id number] DOB: [birth year] DOA: 12/04/2012 PCP: No primary provider on file.  Assessment/Plan: Principal Problem:   Pulmonary embolism    1. Pulmonary embolism: Patient presented with sudden onset pleuritic-type chest pain, and CTA confirmed small bilateral pulmonary emboli. Patient is not on contraceptive medication, and has no other obvious predispositions, although she works from home, and does tend to be quite sedentary. In addition, she sprained her right ankle on 11/30/12, and this may have diminished her mobility further. Managing with ivi Heparin infusion and concomitant Coumadin. Will send off hypercoagulable panel, to evaluate for hereditary etiologies. LE venous doppler has been ordered. As stable, will transition to [facility] ([facility]Rh) Lovenox from 12/06/12.  2. Right Ankle sprain: CXR revealed to bony injuries. Will managing with analgesics. May need ankle support.  3. Anemia: HB was 10.3 on admission, and has dropped to 8.3 today. Patient has no clinical evidence of overt bleed, and I suspect iv fluid infusion may have caused some hemodilution. Will check anemia panel, stool guaiac, and follow CBC. IV fluids have been discontinued.   Code Status: Full Code Family Communication:  Disposition Plan: To be determined.   Brief narrative: 42 year old female with no significant past medical history who presented to [facility] ED with sudden onset chest pain, left sided and radiating to back, 10/10 in intensity, started few days prior to this admission. Patient reported pain was aggravated with breathing and exertion. No fevers or chills. She does have intermittent cough. No palpitations. No abdominal pain, no nausea or vomiting. No lightheadedness or loss of consciousness. In ED, vital were stable and O2 saturation was 95%. CXR showed bronchitic changes and CT angio chest revealed small bilateral pulmonary emboli. Patient started on heparin in  ED.   Consultants:  N/A.   Procedures:  CXR.  Right ankle X-Ray.  CTA.   Antibiotics:  N/A.   HPI/Subjective: Chest pain is less. No SOB. Right ankle pain is under control.   Objective: Vital signs in last 24 hours: Temp:  [97.9 F (36.6 C)-99.2 F (37.3 C)] 97.9 F (36.6 C) (05/22 0548) Pulse Rate:  [82-107] 91 (05/22 0548) Resp:  [18-24] 18 (05/22 0548) BP: (103-112)/(62-69) 112/69 mmHg (05/22 0548) SpO2:  [95 %-100 %] 100 % (05/22 0548) Weight:  [119.8 kg (264 lb 1.8 oz)] 119.8 kg (264 lb 1.8 oz) (05/22 0534) Weight change:  Last BM Date: 12/04/12  Intake/Output from previous day:       Physical Exam: General: Comfortable, alert, communicative, fully oriented, not short of breath at rest.  HEENT:  Moderate clinical pallor, no jaundice, no conjunctival injection or discharge. NECK:  Supple, JVP not seen, no carotid bruits, no palpable lymphadenopathy, no palpable goiter. CHEST:  Clinically clear to auscultation, no wheezes, no crackles. HEART:  Sounds 1 and 2 heard, normal, regular, no murmurs. ABDOMEN:  Moderately obese, soft, non-tender, no palpable organomegaly, no palpable masses, normal bowel sounds. GENITALIA:  Not examined. LOWER EXTREMITIES:  No pitting edema, palpable peripheral pulses. MUSCULOSKELETAL SYSTEM:  Unremarkable. CENTRAL NERVOUS SYSTEM:  No focal neurologic deficit on gross examination.  Lab Results:  Recent Labs  12/05/12 0047 12/05/12 0640  WBC  --  8.8  HGB 10.9* 8.3*  HCT 32.0* 27.3*  PLT  --  240    Recent Labs  12/05/12 0047 12/05/12 0640  NA 136 132*  K 5.0 3.6  CL 106 102  CO2  --  22  GLUCOSE 90 93  BUN 16 10  CREATININE 0.90 0.87  CALCIUM  --  8.2*   No results found for this or any previous visit (from the past 240 hour(s)).   Studies/Results: Dg Chest 2 View  12/04/2012   *RADIOLOGY REPORT*  Clinical Data: Left-sided chest pain and shortness of breath.  CHEST - 2 VIEW  Comparison: 07/20/2012.   Findings: The cardiac silhouette, mediastinal and hilar contours are normal and stable.  Mild bronchitic lung changes and left greater than right basilar atelectasis.  No focal airspace consolidation or pleural effusion.  IMPRESSION: Bronchitic type lung changes and bibasilar atelectasis, left greater than right.   Original Report Authenticated By: Rudie Meyer, M.D.   Dg Ankle Complete Right  12/04/2012   *RADIOLOGY REPORT*  Clinical Data: Injured right ankle.  RIGHT ANKLE - COMPLETE 3+ VIEW  Comparison: None  Findings: The ankle mortise is maintained.  No acute ankle fracture.  No osteochondral lesion.  No joint effusion.  The mid and hind foot bony structures are intact.  IMPRESSION: No acute bony findings.   Original Report Authenticated By: Rudie Meyer, M.D.   Ct Angio Chest Pe W/cm &/or Wo Cm  12/05/2012   *RADIOLOGY REPORT*  Clinical Data: Chest pain and shortness of breath.  CT ANGIOGRAPHY CHEST  Technique:  Multidetector CT imaging of the chest using the standard protocol during bolus administration of intravenous contrast. Multiplanar reconstructed images including MIPs were obtained and reviewed to evaluate the vascular anatomy.  Contrast: OMNIPAQUE IOHEXOL 350 MG/ML SOLN  Comparison: CT chest 07/20/2012.  Findings: The chest wall is unremarkable and stable.  No breast masses, supraclavicular or axillary adenopathy.  Small scattered benign appearing fatty lymph nodes are noted.  The bony thorax is intact.  The heart is normal in size.  No pericardial effusion.  No mediastinal or hilar lymphadenopathy.  Small scattered lymph nodes are noted.  The esophagus is grossly normal. A small hiatal hernia is noted.  The aorta is normal in caliber. No dissection.  The pulmonary arterial tree is fairly well opacified.  There are bilateral pulmonary emboli.  Examination of the lung parenchyma demonstrates patchy subsegmental bibasilar atelectasis.  No worrisome mass lesions or bronchiectasis.  The upper  abdomen is unremarkable.  IMPRESSION:  1.  Small bilateral pulmonary emboli. 2.  Normal thoracic aorta. 3.  Patchy bibasilar subsegmental atelectasis.   Original Report Authenticated By: Rudie Meyer, M.D.    Medications: Scheduled Meds: . sodium chloride   Intravenous STAT  . sodium chloride  3 mL Intravenous Q12H  . Warfarin - Pharmacist Dosing Inpatient   Does not apply q1800   Continuous Infusions: . sodium chloride 50 mL/hr at 12/05/12 0640  . heparin 1,400 Units/hr (12/05/12 0640)   PRN Meds:.acetaminophen, acetaminophen, albuterol, HYDROcodone-acetaminophen, ondansetron (ZOFRAN) IV, ondansetron    LOS: 1 day   Caitlan Chauca,CHRISTOPHER  [facility] Pager [phone]. If 8PM-8AM, please contact night-coverage at [email or web address], password [facility] 12/05/2012, 7:18 AM  LOS: 1 day

## 2012-12-05 NOTE — Progress Notes (Signed)
VASCULAR LAB PRELIMINARY  PRELIMINARY  PRELIMINARY  PRELIMINARY  Bilateral lower extremity venous duplex  completed.    Preliminary report:  Right:  DVT noted in the PTV and peroneal veins.  No evidence of superficial thrombosis.  No Baker's cyst.  Left:  No evidence of DVT, superficial thrombosis, or Baker's cyst.  Eldor Conaway, RVT 12/05/2012, 11:04 AM

## 2012-12-05 NOTE — Progress Notes (Signed)
  Pharmacy Anticoag Note - Brief IV Heparin for B/L PE, RLE DVT  42 yo F on IV heparin (see full anticoag note from previous pharmacist today). Heparin level is subtherapeutic (0.26). Spoke with RN - no bleeding events or problems/inturruptions with the heparin infusion. Will increase heparin from 1650 units/hr to 1900 units/hr(19 ml/hr) and recheck a heparin level in 6 hours. Plan to change to SQ Lovenox tomorrow morning noted.  Darrol Angel, PharmD Pager: [phone] 12/05/2012 9:54 PM

## 2012-12-05 NOTE — Progress Notes (Signed)
ANTICOAGULATION CONSULT NOTE - Follow Up Consult  Pharmacy Consult for Warfarin/Heparin, transition to Lovenox 5/23 AM Indication: Bilateral PE, RLE DVT  No Known Allergies  Patient Measurements: Height: 5\' 10"  (177.8 cm) Weight: 264 lb 1.8 oz (119.8 kg) IBW/kg (Calculated) : 68.5 Heparin Dosing Weight: 84 kg  Vital Signs: Temp: 98 F (36.7 C) (05/22 1417) Temp src: Oral (05/22 1417) BP: 126/73 mmHg (05/22 1417) Pulse Rate: 85 (05/22 1417)  Labs:  Recent Labs  12/05/12 0047 12/05/12 0510 12/05/12 0640 12/05/12 1303  HGB 10.9*  --  8.3*  --   HCT 32.0*  --  27.3*  --   PLT  --   --  240  --   APTT  --  32  --   --   LABPROT  --  13.8  --   --   INR  --  1.07  --   --   HEPARINUNFRC  --   --   --  0.20*  CREATININE 0.90  --  0.87  --     Estimated Creatinine Clearance: 119.6 ml/min (by C-G formula based on Cr of 0.87).   Assessment: 18 yof presented 5/21 with c/o SOB, chest pain. CT angio with bilateral PE. Coumadin and IV heparin ordered. Coumadin score = 9. Also found with new RLE DVT 5/22.   Today is D#1 Coumadin/IV heparin overlap. Heparin currently running at 1400 units/hr and heparin level low at 0.2.  Patient received a dose of Coumadin 10mg  x 1 this AM.     Dr. Brien Few ordered for Lovenox to start 5/23. Pharmacy clarified with MD and he wants Lovenox alone, not bridging to Coumadin.   Goal of Therapy:  Heparin level 0.3-0.7 units/ml Monitor platelets by anticoagulation protocol: Yes   Plan:   IV heparin 1500 units x 1 as bolus then increase IV heparin to 1650 units/hr  Heparin level in 6 hours   Pharmacy will f/u with changing to Lovenox alone tomorrow morning per MD order  D/c daily  PT/INR since pt will no longer be on Coumadin  Geoffry Paradise, PharmD, BCPS Pager: [phone] 3:01 PM Pharmacy #: [id number]

## 2012-12-05 NOTE — Progress Notes (Signed)
ANTICOAGULATION CONSULT NOTE - Initial Consult  Pharmacy Consult for IV Heparin/Warfarin Indication: pulmonary embolus  No Known Allergies  Patient Measurements: Height: 5\' 10"  (177.8 cm) Weight: 264 lb 1.8 oz (119.8 kg) IBW/kg (Calculated) : 68.5 Heparin Dosing Weight: 84 kg  Vital Signs: Temp: 98.8 F (37.1 C) (05/22 0456) Temp src: Oral (05/22 0456) BP: 103/66 mmHg (05/22 0456) Pulse Rate: 94 (05/22 0456)  Labs:  Recent Labs  12/05/12 0047 12/05/12 0510  HGB 10.9*  --   HCT 32.0*  --   APTT  --  32  LABPROT  --  13.8  INR  --  1.07  CREATININE 0.90  --     Estimated Creatinine Clearance: 115.6 ml/min (by C-G formula based on Cr of 0.9).   Medical History: Past Medical History  Diagnosis Date  . Lung collapse     Medications:  Scheduled:  . sodium chloride   Intravenous STAT  . heparin  5,000 Units Intravenous Once  . sodium chloride  3 mL Intravenous Q12H  . warfarin  10 mg Oral Once  . Warfarin - Pharmacist Dosing Inpatient   Does not apply q1800   Infusions:  . sodium chloride    . heparin      Assessment:   Goal of Therapy:  Heparin level 0.3-0.7 units/ml Monitor platelets by anticoagulation protocol: Yes   Plan:   Baseline coags stat  Heparin 5000 units bolus IV x1  Start drip @ 1400 units/hr  Daily HL/CBC  Check 1st HL in 6 hours  Warfarin 10mg  x1 this am. (9 pts)  Daily PT/INR  Education  Susanne Greenhouse R 12/05/2012,5:49 AM

## 2012-12-05 NOTE — Care Management Note (Addendum)
    Page 1 of 2   12/11/2012     4:21:41 PM   CARE MANAGEMENT NOTE 12/11/2012  Patient:  Donna [name], Donna [name]   Account Number:  [id number]  Date Initiated:  12/05/2012  Documentation initiated by:  [facility]  Subjective/Objective Assessment:   ADMITTED W/SOB.PE.     Action/Plan:   FROM HOME ALONE.NO PCP.   Anticipated DC Date:  12/11/2012   Anticipated DC Plan:  HOME/SELF CARE  In-house referral  PCP / [facility]      DC Planning Services  CM consult  MATCH Program      Choice offered to / List presented to:             Status of service:  Completed, signed off Medicare Important Message given?   (If response is "NO", the following Medicare IM given date fields will be blank) Date Medicare IM given:   Date Additional Medicare IM given:    Discharge Disposition:  HOME/SELF CARE  Per UR Regulation:  Reviewed for med. necessity/level of care/duration of stay  If discussed at Long Length of Stay Meetings, dates discussed:    Comments:  12/11/12 Donna Vrooman RN,BSN NCM [phone] MATCH PROGRAM USED FOR LOVENOX-OVER RIDE,COUMADIN.SCRIPTS FAXED TO [facility] W/CONFIRMATION.PCP APPT SET FOR ADULT CLINIC IN F/U SECTION.  12/10/12 Donna Vignola RN,BSN NCM [phone] INR-1.32.LOVENOX/COUMADIN.IF HOME W/LOVENOX,CAN ARRANGE MATCH PROGRAM-PATIENT AWARE OF POLICY AS STATED BELOW.PATIENT HAS RECEIVED CALL FROM ADULT CLINIC-THEY WILL MAKE APPT FOR F/U INR,& PCP.  12/07/12 Donna Goodbar RN,BSN NCM WEEKEND [phone] SPOKE TO PATIENT ABOUT D/C ?XARELTO/LOVENOX.MD NOTED TRANSITIONED TO LOVENOX.MATCH PROGRAM CAN BE USED FOR HER MEDS.WILL NEED HARD PRESCRIPTION.PATIENT STATES SHE HAS THE $3 CO PAY FOR EACH SCRIPT IF MATCH PROGRAM USED,ALSO INFORMED HER OF 1X USE POLICY WITH 12 MONTHS/NO REFILLS/NO NARCOTICS.SHE VOICED UNDERSTANDING.SHE HAS NOT RECEIVED A CALL BACK FROM ADULT CLINIC SINCE YESTERDAY.PAP APPT ALREADY ON F/U SECTION OF D/C,ADULT CLINIC WILL FOLLOW HER FOR HER PT/INR  ALSO.  12/06/12 Donna Florendo RN,BSN NCM [phone] PER MD PATIENT MAY D/C ON XARELTO,IF QUALIFIES FOR MATCH PROGRAM WILL NEED SCRIPT FOR ENTIRE AMOUNT.PATIENT INFORMED OF MATCH PROGRAM POLICY:1X USE/12 MONTHS/$3 CO PAY(SAYS SHE CAN AFFORD)/NO NARCOTICS/SELECT OTPT PHARMACIES.PER NSG LOVENOX INSTRUCTION DONE,& PATIENT ABLE TO SELF INJECT.WILL NEED SCRIPT.CHECKED W/[facility] WILL HAVE XARELTO FOR MATCH PROGRAM AVAILABLE.TC ADULT CLINIC THEY CAN CHECK PT/INR.[facility]COU[facility] NOT ABLE TO UNLESS PATIENT HAS BEEN SEEN BY ONE OF THEIR DOCTORS IN THE HOSPITAL.MD UPDATED. HEPARIN GTT.PROVIDED W/PCP LISTING,ENCOURAGED TO MAKE APPT WHILE IN HOSPITAL,PROVIDED W/COMMUNITY RESOURCES-[facility] WEBSITE,HEALTH INSURANCE WEBSITE,& $4 [facility] MED LIST.

## 2012-12-06 DIAGNOSIS — I82409 Acute embolism and thrombosis of unspecified deep veins of unspecified lower extremity: Secondary | ICD-10-CM

## 2012-12-06 HISTORY — DX: Acute embolism and thrombosis of unspecified deep veins of unspecified lower extremity: I82.409

## 2012-12-06 LAB — BETA-2-GLYCOPROTEIN I ABS, IGG/M/A
Beta-2 Glyco I IgG: 0 G Units (ref <20)
Beta-2-Glycoprotein I IgA: 2 A Units (ref <20)
Beta-2-Glycoprotein I IgM: 7 M Units (ref <20)

## 2012-12-06 LAB — PROTEIN S, TOTAL: Protein S Ag, Total: 93 % (ref 60–150)

## 2012-12-06 LAB — PROTEIN C ACTIVITY: Protein C Activity: 89 % (ref 75–133)

## 2012-12-06 LAB — BASIC METABOLIC PANEL
BUN: 10 mg/dL (ref 6–23)
CO2: 25 mEq/L (ref 19–32)
Calcium: 8.5 mg/dL (ref 8.4–10.5)
GFR calc non Af Amer: 81 mL/min — ABNORMAL LOW (ref >90)
Glucose, Bld: 89 mg/dL (ref 70–99)
Sodium: 135 mEq/L (ref 135–145)

## 2012-12-06 LAB — LUPUS ANTICOAGULANT PANEL
DRVVT: 38.5 secs (ref <42.9)
Lupus Anticoagulant: DETECTED — AB
PTT Lupus Anticoagulant: 89.1 secs — ABNORMAL HIGH (ref 28.0–43.0)

## 2012-12-06 LAB — FACTOR 5 LEIDEN

## 2012-12-06 LAB — CBC
HCT: 28.3 % — ABNORMAL LOW (ref 36.0–46.0)
Hemoglobin: 8.8 g/dL — ABNORMAL LOW (ref 12.0–15.0)
MCH: 19.8 pg — ABNORMAL LOW (ref 26.0–34.0)
MCHC: 31.1 g/dL (ref 30.0–36.0)
RBC: 4.44 MIL/uL (ref 3.87–5.11)

## 2012-12-06 LAB — IRON AND TIBC
Iron: <10 ug/dL — ABNORMAL LOW (ref 42–135)
UIBC: 330 ug/dL (ref 125–400)

## 2012-12-06 LAB — PROTEIN S ACTIVITY: Protein S Activity: 108 % (ref 69–129)

## 2012-12-06 MED ORDER — OXYCODONE HCL 5 MG PO TABS
5.0000 mg | ORAL_TABLET | Freq: Three times a day (TID) | ORAL | Status: DC
  Administered 2012-12-06 – 2012-12-11 (×16): 5 mg via ORAL
  Filled 2012-12-06 (×16): qty 1

## 2012-12-06 MED ORDER — ENOXAPARIN SODIUM 120 MG/0.8ML ~~LOC~~ SOLN
120.0000 mg | Freq: Two times a day (BID) | SUBCUTANEOUS | Status: DC
  Administered 2012-12-06 – 2012-12-11 (×11): 120 mg via SUBCUTANEOUS
  Filled 2012-12-06 (×12): qty 0.8

## 2012-12-06 MED ORDER — HYDROCODONE-ACETAMINOPHEN 5-325 MG PO TABS
1.0000 | ORAL_TABLET | ORAL | Status: DC | PRN
  Administered 2012-12-07 – 2012-12-11 (×5): 1 via ORAL
  Filled 2012-12-06 (×6): qty 1

## 2012-12-06 MED ORDER — FERROUS SULFATE 325 (65 FE) MG PO TABS
325.0000 mg | ORAL_TABLET | Freq: Three times a day (TID) | ORAL | Status: DC
  Administered 2012-12-06 – 2012-12-11 (×16): 325 mg via ORAL
  Filled 2012-12-06 (×19): qty 1

## 2012-12-06 NOTE — Progress Notes (Signed)
[facility] PROGRESS NOTE  Donna [name] ION:[id number] DOB: Oct 07, 1970 DOA: 12/04/2012 PCP: No primary provider on file.  Assessment/Plan: Principal Problem:   Pulmonary embolism Active Problems:   Anemia   Right ankle sprain    1. Pulmonary embolism/DVT: Patient presented with sudden onset pleuritic-type chest pain, and CTA confirmed small bilateral pulmonary emboli. Patient is not on contraceptive medication, and has no other obvious predispositions, although she works from home, and does tend to be quite sedentary. In addition, she sprained her right ankle on 11/30/12, and this may have diminished her mobility further. LE venous doppler on 12/05/12, revealed DVT in the right PTV and peroneal veins. Managed initially with ivi Heparin infusion and concomitant Coumadin. As of 12/06/12, has been transitioned to [facility] Lovenox. Hypercoagulable panel report is pending.  2. Right Ankle sprain: CXR revealed to bony injuries. Managing with analgesics and ankle support. For PT/OT.  3. Anemia: HB was 10.3 on admission, and has dropped to 8.3 on 12/05/12, possible due to hemodilution from iv fluids, which were discontinued on that date. Today, HB is 8.8. Anemia is microcytic, and anemia panel revealed iron deficiency. On detailed questioning, patient admits to increasingly heavy menses, which appears to be the likely culprit. Have commenced iron supplements.   Code Status: Full Code Family Communication:  Disposition Plan: To be determined.   Brief narrative: 42 year old female with no significant past medical history who presented to [facility] ED with sudden onset chest pain, left sided and radiating to back, 10/10 in intensity, started few days prior to this admission. Patient reported pain was aggravated with breathing and exertion. No fevers or chills. She does have intermittent cough. No palpitations. No abdominal pain, no nausea or vomiting. No lightheadedness or loss of consciousness. In ED, vital  were stable and O2 saturation was 95%. CXR showed bronchitic changes and CT angio chest revealed small bilateral pulmonary emboli. Patient started on heparin in ED.   Consultants:  N/A.   Procedures:  CXR.  Right ankle X-Ray.  CTA.   Antibiotics:  N/A.   HPI/Subjective: No new issues.    Objective: Vital signs in last 24 hours: Temp:  [98 F (36.7 C)] 98 F (36.7 C) (05/23 0559) Pulse Rate:  [80-85] 83 (05/23 0559) Resp:  [16-18] 16 (05/23 0559) BP: (112-126)/(57-73) 119/67 mmHg (05/23 0559) SpO2:  [100 %] 100 % (05/23 0559) Weight:  [120.2 kg (264 lb 15.9 oz)] 120.2 kg (264 lb 15.9 oz) (05/23 0559) Weight change: 0.4 kg (14.1 oz) Last BM Date: 12/04/12  Intake/Output from previous day: 05/22 0701 - 05/23 0700 In: 1199.8 [P.O.:390; I.V.:809.8] Out: 800 [Urine:800] Total I/O In: -  Out: 400 [Urine:400]   Physical Exam: General: Comfortable, alert, communicative, fully oriented, not short of breath at rest.  HEENT:  Moderate clinical pallor, no jaundice, no conjunctival injection or discharge. NECK:  Supple, JVP not seen, no carotid bruits, no palpable lymphadenopathy, no palpable goiter. CHEST:  Clinically clear to auscultation, no wheezes, no crackles. HEART:  Sounds 1 and 2 heard, normal, regular, no murmurs. ABDOMEN:  Moderately obese, soft, non-tender, no palpable organomegaly, no palpable masses, normal bowel sounds. GENITALIA:  Not examined. LOWER EXTREMITIES:  No pitting edema, palpable peripheral pulses. MUSCULOSKELETAL SYSTEM:  Has right ankle support.  CENTRAL NERVOUS SYSTEM:  No focal neurologic deficit on gross examination.  Lab Results:  Recent Labs  12/05/12 0640 12/06/12 0525  WBC 8.8 7.7  HGB 8.3* 8.8*  HCT 27.3* 28.3*  PLT 240 288  Recent Labs  12/05/12 0640 12/06/12 0525  NA 132* 135  K 3.6 3.9  CL 102 104  CO2 22 25  GLUCOSE 93 89  BUN 10 10  CREATININE 0.87 0.88  CALCIUM 8.2* 8.5   No results found for this or any  previous visit (from the past 240 hour(s)).   Studies/Results: Dg Chest 2 View  12/04/2012   *RADIOLOGY REPORT*  Clinical Data: Left-sided chest pain and shortness of breath.  CHEST - 2 VIEW  Comparison: 07/20/2012.  Findings: The cardiac silhouette, mediastinal and hilar contours are normal and stable.  Mild bronchitic lung changes and left greater than right basilar atelectasis.  No focal airspace consolidation or pleural effusion.  IMPRESSION: Bronchitic type lung changes and bibasilar atelectasis, left greater than right.   Original Report Authenticated By: Rudie Meyer, M.D.   Dg Ankle Complete Right  12/04/2012   *RADIOLOGY REPORT*  Clinical Data: Injured right ankle.  RIGHT ANKLE - COMPLETE 3+ VIEW  Comparison: None  Findings: The ankle mortise is maintained.  No acute ankle fracture.  No osteochondral lesion.  No joint effusion.  The mid and hind foot bony structures are intact.  IMPRESSION: No acute bony findings.   Original Report Authenticated By: Rudie Meyer, M.D.   Ct Angio Chest Pe W/cm &/or Wo Cm  12/05/2012   *RADIOLOGY REPORT*  Clinical Data: Chest pain and shortness of breath.  CT ANGIOGRAPHY CHEST  Technique:  Multidetector CT imaging of the chest using the standard protocol during bolus administration of intravenous contrast. Multiplanar reconstructed images including MIPs were obtained and reviewed to evaluate the vascular anatomy.  Contrast: OMNIPAQUE IOHEXOL 350 MG/ML SOLN  Comparison: CT chest 07/20/2012.  Findings: The chest wall is unremarkable and stable.  No breast masses, supraclavicular or axillary adenopathy.  Small scattered benign appearing fatty lymph nodes are noted.  The bony thorax is intact.  The heart is normal in size.  No pericardial effusion.  No mediastinal or hilar lymphadenopathy.  Small scattered lymph nodes are noted.  The esophagus is grossly normal. A small hiatal hernia is noted.  The aorta is normal in caliber. No dissection.  The pulmonary arterial  tree is fairly well opacified.  There are bilateral pulmonary emboli.  Examination of the lung parenchyma demonstrates patchy subsegmental bibasilar atelectasis.  No worrisome mass lesions or bronchiectasis.  The upper abdomen is unremarkable.  IMPRESSION:  1.  Small bilateral pulmonary emboli. 2.  Normal thoracic aorta. 3.  Patchy bibasilar subsegmental atelectasis.   Original Report Authenticated By: Rudie Meyer, M.D.    Medications: Scheduled Meds: . enoxaparin (LOVENOX) injection  120 mg Subcutaneous Q12H  . sodium chloride  3 mL Intravenous Q12H   Continuous Infusions:   PRN Meds:.acetaminophen, acetaminophen, albuterol, HYDROcodone-acetaminophen, ondansetron (ZOFRAN) IV, ondansetron    LOS: 2 days   Cowan Pilar,CHRISTOPHER  [facility] Pager [phone]. If 8PM-8AM, please contact night-coverage at [email or web address], password [facility] 12/06/2012, 10:07 AM  LOS: 2 days

## 2012-12-06 NOTE — Progress Notes (Addendum)
ANTICOAGULATION CONSULT NOTE - Initial Consult  Pharmacy Consult for Lovenox Indication: new bilateral PE and RLE DVT  No Known Allergies  Patient Measurements: Height: 5\' 10"  (177.8 cm) Weight: 264 lb 15.9 oz (120.2 kg) IBW/kg (Calculated) : 68.5  Vital Signs: Temp: 98 F (36.7 C) (05/23 0559) Temp src: Oral (05/23 0559) BP: 119/67 mmHg (05/23 0559) Pulse Rate: 83 (05/23 0559)  Labs:  Recent Labs  12/05/12 0047 12/05/12 0510 12/05/12 0640 12/05/12 1303 12/05/12 2057 12/06/12 0525  HGB 10.9*  --  8.3*  --   --  8.8*  HCT 32.0*  --  27.3*  --   --  28.3*  PLT  --   --  240  --   --  288  APTT  --  32  --   --   --   --   LABPROT  --  13.8  --   --   --   --   INR  --  1.07  --   --   --   --   HEPARINUNFRC  --   --   --  0.20* 0.26* 0.46  CREATININE 0.90  --  0.87  --   --  0.88    Estimated Creatinine Clearance: 118.5 ml/min (by C-G formula based on Cr of 0.88).   Medical History: Past Medical History  Diagnosis Date  . Lung collapse     Assessment: 41 yof presented 5/21 with c/o SOB, chest pain. CT angio with new bilateral PE. Coumadin and IV heparin ordered. LE dopplers on 5/22 also positive for RLE DVT.  MD consulted pharmacy to start Lovenox.  Pharmacy clarified with Dr. Brien Few, and the intention is patient will be on Lovenox alone, not bridging to Coumadin  Heparin level therapeutic this AM. INR not check, assumed low given only 1 dose of Coumadin 5/22 AM. Scr wnl for CrCl > 100 ml/min, Hgb 8.8 (low but stable), plts wnl - no bleeding documented. Pt weighs 120 kg  Goal of Therapy:  Anti-Xa level 0.6-1.2 units/ml 4hrs after LMWH dose given Monitor platelets by anticoagulation protocol: Yes   Plan:   At 9:00 AM today, stop IV heparin gtt.    At 10:00 AM today, start Lovenox 120 mg sq q12h  RN Okey Regal) notified of plans  CBC q72 hours while on Lovenox  D/C all heparin levels and related labs   Geoffry Paradise, PharmD, BCPS Pager: [phone] 8:23  AM Pharmacy #: [phone]

## 2012-12-06 NOTE — Progress Notes (Signed)
Pharmacy Anticoagulation Note-Brief IV Heparin for B/L PE, RLE DVT  Assessement:  HL= 0.46 after increasing heparin to 1900 unit/hr  No problems reported from RN.  Plan:  Continue heparin drip @ 1900 units/hr  Will not reorder additional HL for today- switching to Lovenox today per MD.  Lorenza Evangelist 12/06/2012 6:33 AM

## 2012-12-07 LAB — CBC
Hemoglobin: 8.3 g/dL — ABNORMAL LOW (ref 12.0–15.0)
MCH: 18.9 pg — ABNORMAL LOW (ref 26.0–34.0)
MCHC: 29.6 g/dL — ABNORMAL LOW (ref 30.0–36.0)
Platelets: 284 10*3/uL (ref 150–400)
RDW: 17.8 % — ABNORMAL HIGH (ref 11.5–15.5)

## 2012-12-07 LAB — BASIC METABOLIC PANEL
Calcium: 8.6 mg/dL (ref 8.4–10.5)
GFR calc non Af Amer: 66 mL/min — ABNORMAL LOW (ref >90)
Glucose, Bld: 91 mg/dL (ref 70–99)
Sodium: 135 mEq/L (ref 135–145)

## 2012-12-07 LAB — GLUCOSE, CAPILLARY: Glucose-Capillary: 85 mg/dL (ref 70–99)

## 2012-12-07 MED ORDER — WARFARIN - PHARMACIST DOSING INPATIENT
Freq: Every day | Status: DC
  Administered 2012-12-07: 18:00:00

## 2012-12-07 MED ORDER — PATIENT'S GUIDE TO USING COUMADIN BOOK
Freq: Once | Status: AC
  Administered 2012-12-07: 15:00:00
  Filled 2012-12-07: qty 1

## 2012-12-07 MED ORDER — WARFARIN VIDEO
Freq: Once | Status: AC
  Administered 2012-12-07: 15:00:00

## 2012-12-07 MED ORDER — WARFARIN SODIUM 10 MG PO TABS
10.0000 mg | ORAL_TABLET | Freq: Once | ORAL | Status: AC
  Administered 2012-12-07: 10 mg via ORAL
  Filled 2012-12-07: qty 1

## 2012-12-07 NOTE — Evaluation (Signed)
Occupational Therapy Evaluation Patient Details Name: Donna [name] MRN: [id number] DOB: 11/21/1970 Today's Date: 12/07/2012 Time: 4098-1191 OT Time Calculation (min): 20 min  OT Assessment / Plan / Recommendation Clinical Impression  This 42 year old female was admitted for R DVT and small PE.  She recently sprained R foot and is wearing a brace. Pt lives alone but can have assistance prn from family and friends.  She will not need any further OT at this time.      OT Assessment  Patient does not need any further OT services    Follow Up Recommendations  No OT follow up    Barriers to Discharge      Equipment Recommendations   (recommended pt get clamp on grab bar & someone at home when she showers for safety )    Recommendations for Other Services    Frequency       Precautions / Restrictions Precautions Precautions: Fall Required Braces or Orthoses: Other Brace/Splint Other Brace/Splint: has ankle brace to be worn when OOB Restrictions Weight Bearing Restrictions: No Other Position/Activity Restrictions: WBAT   Pertinent Vitals/Pain     ADL  Toilet Transfer: Min guard Toilet Transfer Method: Sit to Barista:  (bed to recliner) Transfers/Ambulation Related to ADLs: min guard ambulating with RW.  Pt very unsteady without this.   ADL Comments: Pt is able to perform adls with set up for UB and min guard for LB. She has a standard commode at home and was able to get up using the door handle. She doesn't feel she needs anything on top. She will have help prn.  Pt has been able to step into tub.  Discussed bar for safety and having someone with her, if she doesn't want to get a chair    OT Diagnosis:    OT Problem List:   OT Treatment Interventions:     OT Goals    Visit Information  Last OT Received On: 12/07/12 Assistance Needed: +1    Subjective Data      Prior Functioning     Home Living Lives With: Alone; family and friends can  help as needed Type of Home: House Home Access: Stairs to enter Secretary/administrator of Steps: 3 Entrance Stairs-Rails: Left Home Layout: Two level (doesn't need to go upstairs) Bathroom Shower/Tub: Health visitor: Standard Home Adaptive Equipment: None Prior Function Level of Independence: Independent Able to Take Stairs?: Yes Driving: No Communication Communication: No difficulties         Vision/Perception     Cognition  Cognition Arousal/Alertness: Awake/alert Behavior During Therapy: WFL for tasks assessed/performed Overall Cognitive Status: Within Functional Limits for tasks assessed    Extremity/Trunk Assessment Right Lower Extremity Assessment RLE ROM/Strength/Tone: Deficits RLE ROM/Strength/Tone Deficits: Pt with some mild strength issues, more so with DF and inversion/eversion, however WFL per tasks assessed and used ankle brace for increased support.  RLE Sensation: WFL - Light Touch Left Lower Extremity Assessment LLE ROM/Strength/Tone: WFL for tasks assessed LLE Sensation: WFL - Light Touch     Mobility Bed Mobility Bed Mobility: Supine to Sit Supine to Sit: 6: Modified independent (Device/Increase time) Details for Bed Mobility Assistance: increased time Transfers Sit to Stand: 4: Min guard;From elevated surface;From bed Stand to Sit: 4: Min guard;With upper extremity assist;With armrests;To chair/3-in-1 Details for Transfer Assistance: min guard for safety     Exercise    Balance     End of Session OT - End of Session Activity  Tolerance: Patient tolerated treatment well Patient left: in chair;with call bell/phone within reach;with family/visitor present  GO     Maelynn Moroney 12/07/2012, 12:50 PM Marica Otter, OTR/L [phone] 12/07/2012

## 2012-12-07 NOTE — Progress Notes (Signed)
[facility] PROGRESS NOTE  Donna [name] MWU:[id number] DOB: 02/10/1971 DOA: 12/04/2012 PCP: No primary provider on file.  Assessment/Plan: Principal Problem:   Pulmonary embolism Active Problems:   Anemia   Right ankle sprain   DVT (deep venous thrombosis)    1. Pulmonary embolism/DVT: Patient presented with sudden onset pleuritic-type chest pain, and CTA confirmed small bilateral pulmonary emboli. Patient is not on contraceptive medication, and has no other obvious predispositions, although she works from home, and does tend to be quite sedentary. In addition, she sprained her right ankle on 11/30/12, and this may have diminished her mobility further. LE venous doppler on 12/05/12, revealed DVT in the right PTV and peroneal veins. Managed initially with ivi Heparin infusion. As of 12/06/12, has been transitioned to [facility] Lovenox/Concomittant. Hypercoagulable panel report is pending.  2. Right Ankle sprain: CXR revealed to bony injuries. Managing with analgesics, ankle support and PT/OT.  3. Anemia: HB was 10.3 on admission, and dropped to 8.3 on 12/05/12, possibly due to hemodilution from iv fluids, which were discontinued on that date. On 12/06/12, HB was 8.8. Anemia is microcytic, and anemia panel revealed iron deficiency. On detailed questioning, patient admits to increasingly heavy menses, which appears to be the likely culprit. Now on iron supplements. Have discussed with Dr Scheryl Darter, OB/GYN at [facility] hospital today, and he has kindly agreed to have patient follow up with him at [facility], on discharge.   Code Status: Full Code Family Communication:  Disposition Plan: To be determined.   Brief narrative: 42 year old female with no significant past medical history who presented to [facility] ED with sudden onset chest pain, left sided and radiating to back, 10/10 in intensity, started few days prior to this admission. Patient reported pain was aggravated with  breathing and exertion. No fevers or chills. She does have intermittent cough. No palpitations. No abdominal pain, no nausea or vomiting. No lightheadedness or loss of consciousness. In ED, vital were stable and O2 saturation was 95%. CXR showed bronchitic changes and CT angio chest revealed small bilateral pulmonary emboli. Patient started on heparin in ED.   Consultants:  N/A.   Procedures:  CXR.  Right ankle X-Ray.  CTA.   Antibiotics:  N/A.   HPI/Subjective: Comfortable. Right ankle pain is much less.    Objective: Vital signs in last 24 hours: Temp:  [97.9 F (36.6 C)-98.3 F (36.8 C)] 98.2 F (36.8 C) (05/24 0556) Pulse Rate:  [78-85] 78 (05/24 0556) Resp:  [16-18] 16 (05/24 0556) BP: (105-117)/(56-71) 117/64 mmHg (05/24 0556) SpO2:  [100 %] 100 % (05/24 0556) Weight:  [119.1 kg (262 lb 9.1 oz)] 119.1 kg (262 lb 9.1 oz) (05/24 0542) Weight change: -1.1 kg (-2 lb 6.8 oz) Last BM Date: 12/04/12  Intake/Output from previous day: 05/23 0701 - 05/24 0700 In: 540.5 [P.O.:500; I.V.:40.5] Out: 700 [Urine:700] Total I/O In: -  Out: 200 [Urine:200]   Physical Exam: General: Comfortable, alert, communicative, fully oriented, not short of breath at rest.  HEENT:  Moderate clinical pallor, no jaundice, no conjunctival injection or discharge. NECK:  Supple, JVP not seen, no carotid bruits, no palpable lymphadenopathy, no palpable goiter. CHEST:  Clinically clear to auscultation, no wheezes, no crackles. HEART:  Sounds 1 and 2 heard, normal, regular, no murmurs. ABDOMEN:  Moderately obese, soft, non-tender, no palpable organomegaly, no palpable masses, normal bowel sounds. GENITALIA:  Not examined. LOWER EXTREMITIES:  No pitting edema, palpable peripheral pulses. MUSCULOSKELETAL SYSTEM:  Has right ankle support.  CENTRAL NERVOUS SYSTEM:  No focal neurologic deficit on gross examination.  Lab Results:  Recent Labs  12/06/12 0525 12/07/12 0520  WBC 7.7 8.2  HGB  8.8* 8.3*  HCT 28.3* 28.0*  PLT 288 284    Recent Labs  12/06/12 0525 12/07/12 0520  NA 135 135  K 3.9 4.0  CL 104 103  CO2 25 24  GLUCOSE 89 91  BUN 10 15  CREATININE 0.88 1.04  CALCIUM 8.5 8.6   No results found for this or any previous visit (from the past 240 hour(s)).   Studies/Results: No results found.  Medications: Scheduled Meds: . enoxaparin (LOVENOX) injection  120 mg Subcutaneous Q12H  . ferrous sulfate  325 mg Oral TID WC  . oxyCODONE  5 mg Oral TID  . sodium chloride  3 mL Intravenous Q12H   Continuous Infusions:   PRN Meds:.acetaminophen, acetaminophen, albuterol, HYDROcodone-acetaminophen, ondansetron (ZOFRAN) IV, ondansetron    LOS: 3 days   Donna [name],CHRISTOPHER  [facility] Pager [phone]. If 8PM-8AM, please contact night-coverage at [email or web address], password [facility] 12/07/2012, 12:21 PM  LOS: 3 days

## 2012-12-07 NOTE — Progress Notes (Signed)
ANTICOAGULATION CONSULT NOTE - Follow up Consult  Pharmacy Consult for Lovenox and Coumadin Indication: new bilateral PE and RLE DVT  No Known Allergies  Patient Measurements: Height: 5\' 10"  (177.8 cm) Weight: 262 lb 9.1 oz (119.1 kg) IBW/kg (Calculated) : 68.5  Vital Signs: Temp: 98.2 F (36.8 C) (05/24 0556) Temp src: Oral (05/24 0556) BP: 117/64 mmHg (05/24 0556) Pulse Rate: 78 (05/24 0556)  Labs:  Recent Labs  12/05/12 0510 12/05/12 0640 12/05/12 1303 12/05/12 2057 12/06/12 0525 12/07/12 0520  HGB  --  8.3*  --   --  8.8* 8.3*  HCT  --  27.3*  --   --  28.3* 28.0*  PLT  --  240  --   --  288 284  APTT 32  --   --   --   --   --   LABPROT 13.8  --   --   --   --   --   INR 1.07  --   --   --   --   --   HEPARINUNFRC  --   --  0.20* 0.26* 0.46  --   CREATININE  --  0.87  --   --  0.88 1.04    Estimated Creatinine Clearance: 99.7 ml/min (by C-G formula based on Cr of 1.04).   Medical History: Past Medical History  Diagnosis Date  . Lung collapse     Assessment: 41 yof presented 5/21 with c/o SOB, chest pain. CT angio with new bilateral PE and dopplers positive for RLE DVT. Coumadin and IV heparin originally ordered then switched to Lovenox alone and considering Xarelto but on 5/24 resumed Coumadin.   INR did not rise after Coumadin 10mg  x 1 given on 5/22. No Coumadin was given on 5/23. CHEST guidelines recommend 5 day overlap of Coumadin and heparin/Lovenox. Should technically call today overlap day #1.  CBC is stable. No bleeding reported/documented.  Goal of Therapy:  INR 2-3 Anti-Xa level 0.6-1.2 units/ml 4hrs after LMWH dose given Monitor platelets by anticoagulation protocol: Yes   Plan:   Cont Lovenox 120 mg sq q12h.  Give Coumadin 10 mg x 1 today.  Give Coumadin booklet and have patient view the video.  INR daily.  CBC q72 hours while on Lovenox.  Charolotte Eke, PharmD, pager [phone]. 12/07/2012,12:30 PM.

## 2012-12-07 NOTE — Evaluation (Addendum)
Physical Therapy Evaluation Patient Details Name: Donna [name] MRN: [id number] DOB: [birth year] Today's Date: 12/07/2012 Time: 4098-1191 PT Time Calculation (min): 30 min  PT Assessment / Plan / Recommendation Clinical Impression  Pt presents with acute R LE DVT and small B PE with recent R ankle sprain (5/17).  Tolerated OOB and ambulation in hallway very well with RW at min/guard assist for safety.  Note that she has ankle brace to wear when OOB walking.  Does well with knowing how to don/doff brace.  Educated pt on ankle exercises to perform as well as to ice and elevate when not walking.  Pt will benefit from skilled PT in acute venue to continue with balance deficits and gait training, however feel she will not require any further follow up at home, as she will have intermittent assist.      PT Assessment  Patient needs continued PT services    Follow Up Recommendations  No PT follow up;Supervision - Intermittent    Does the patient have the potential to tolerate intense rehabilitation      Barriers to Discharge None      Equipment Recommendations  Rolling walker with 5" wheels (would benefit from wider RW (not bari))    Recommendations for Other Services     Frequency Min 3X/week    Precautions / Restrictions Precautions Precautions: Fall Required Braces or Orthoses: Other Brace/Splint Other Brace/Splint: has ankle brace to be worn when OOB Restrictions Weight Bearing Restrictions: No Other Position/Activity Restrictions: WBAT   Pertinent Vitals/Pain 3-4/10, ice packs applied, elevated RLE      Mobility  Bed Mobility Bed Mobility: Supine to Sit Supine to Sit: 6: Modified independent (Device/Increase time) Details for Bed Mobility Assistance: increased time Transfers Transfers: Sit to Stand;Stand to Sit Sit to Stand: 4: Min guard;From elevated surface;From bed Stand to Sit: 4: Min guard;With upper extremity assist;With armrests;To chair/3-in-1 Details for  Transfer Assistance: Min/guard for safety with cues for hand placement/safety.  Ambulation/Gait Ambulation/Gait Assistance: 4: Min assist;4: Min Government social research officer (Feet): 300 Feet Assistive device: None;Rolling walker Ambulation/Gait Assistance Details: Had pt initially use no AD to ambulate from bed to door, however noted that she is very unsteady/unbalanced and reaches for objects to steady.  Provided pt with RW with marked improvement in balance and stability.  Gait Pattern: Step-to pattern;Step-through pattern;Decreased stride length;Antalgic Stairs: No Wheelchair Mobility Wheelchair Mobility: No    Exercises Other Exercises Other Exercises: resisted ankle DF, PF, inversion and eversion x 10 reps Other Exercises: ankle alphabet   PT Diagnosis: Difficulty walking;Acute pain;Generalized weakness  PT Problem List: Decreased range of motion;Decreased balance;Decreased mobility;Decreased knowledge of use of DME;Pain PT Treatment Interventions: DME instruction;Gait training;Functional mobility training;Therapeutic activities;Therapeutic exercise;Balance training;Patient/family education   PT Goals Acute Rehab PT Goals PT Goal Formulation: With patient Time For Goal Achievement: 12/14/12 Potential to Achieve Goals: Good Pt will Ambulate: >150 feet;with modified independence;with least restrictive assistive device PT Goal: Ambulate - Progress: Goal set today Pt will Go Up / Down Stairs: 3-5 stairs;with supervision;with rail(s) PT Goal: Up/Down Stairs - Progress: Goal set today Pt will Perform Home Exercise Program: Independently PT Goal: Perform Home Exercise Program - Progress: Goal set today  Visit Information  Last PT Received On: 12/07/12 Assistance Needed: +1    Subjective Data  Subjective: I want to get up Patient Stated Goal: to return home.    Prior Functioning  Home Living Lives With: Alone Type of Home: House Home Access: Stairs to enter [employer]  of Steps: 3 Entrance Stairs-Rails: Left Home Layout: Two level (doesn't need to go upstairs) Bathroom Shower/Tub: Health visitor: Standard Home Adaptive Equipment: None Prior Function Level of Independence: Independent Able to Take Stairs?: Yes Driving: No Communication Communication: No difficulties    Cognition  Cognition Arousal/Alertness: Awake/alert Behavior During Therapy: WFL for tasks assessed/performed Overall Cognitive Status: Within Functional Limits for tasks assessed    Extremity/Trunk Assessment Right Lower Extremity Assessment RLE ROM/Strength/Tone: Deficits RLE ROM/Strength/Tone Deficits: Pt with some mild strength issues, more so with DF and inversion/eversion, however WFL per tasks assessed and used ankle brace for increased support.  RLE Sensation: WFL - Light Touch Left Lower Extremity Assessment LLE ROM/Strength/Tone: WFL for tasks assessed LLE Sensation: WFL - Light Touch   Balance    End of Session PT - End of Session Activity Tolerance: Patient tolerated treatment well Patient left: in chair;with call bell/phone within reach;with family/visitor present  GP     Vista Deck 12/07/2012, 10:54 AM

## 2012-12-08 DIAGNOSIS — Z5189 Encounter for other specified aftercare: Secondary | ICD-10-CM

## 2012-12-08 LAB — CBC
HCT: 29.1 % — ABNORMAL LOW (ref 36.0–46.0)
Hemoglobin: 8.8 g/dL — ABNORMAL LOW (ref 12.0–15.0)
MCHC: 30.2 g/dL (ref 30.0–36.0)

## 2012-12-08 LAB — PROTIME-INR: INR: 1.07 (ref 0.00–1.49)

## 2012-12-08 MED ORDER — WARFARIN SODIUM 10 MG PO TABS
10.0000 mg | ORAL_TABLET | Freq: Once | ORAL | Status: AC
  Administered 2012-12-08: 10 mg via ORAL
  Filled 2012-12-08: qty 1

## 2012-12-08 NOTE — Progress Notes (Signed)
ANTICOAGULATION CONSULT NOTE - Follow up Consult  Pharmacy Consult for Lovenox and Coumadin Indication: new bilateral PE and RLE DVT  No Known Allergies  Patient Measurements: Height: 5\' 10"  (177.8 cm) Weight: 261 lb 11 oz (118.7 kg) IBW/kg (Calculated) : 68.5  Labs:  Recent Labs  12/05/12 1303 12/05/12 2057  12/06/12 0525 12/07/12 0520 12/07/12 1240 12/08/12 0550  HGB  --   --   < > 8.8* 8.3*  --  8.8*  HCT  --   --   --  28.3* 28.0*  --  29.1*  PLT  --   --   --  288 284  --  341  LABPROT  --   --   --   --   --  13.5 13.8  INR  --   --   --   --   --  1.04 1.07  HEPARINUNFRC 0.20* 0.26*  --  0.46  --   --   --   CREATININE  --   --   --  0.88 1.04  --   --   < > = values in this interval not displayed.  Estimated Creatinine Clearance: 99.6 ml/min (by C-G formula based on Cr of 1.04).   Anticoag or Interacting Meds: Lovenox 120mg  SQ q12h Warfarin - 10mg , none, 10mg   Assessment: 42 yo F presented 5/21 with c/o SOB, chest pain. CT angio with new bilateral PE and dopplers positive for RLE DVT. Coumadin and IV heparin originally ordered then switched to Lovenox alone and considering Xarelto but on 5/24 resumed Coumadin.   Today is Day #2 of 5 day minimum overlap with warfarin/Lovenox  INR today is subtherapeutic as expected  CBC is stable. No bleeding reported/documented in chart notes.  Goal of Therapy:  INR 2-3 Anti-Xa level 0.6-1.2 units/ml 4hrs after LMWH dose given Monitor platelets by anticoagulation protocol: Yes   Plan:   Cont Lovenox 120 mg sq q12h through 5/28 AND until INR > 2 for 2 consecutive days.  Repeat Coumadin 10 mg x 1 today.  INR daily.  CBC q72 hours while on Lovenox.  Warfarin education prior to discharge  Darrol Angel, PharmD Pager: [phone] 12/08/2012 8:40 AM

## 2012-12-08 NOTE — Progress Notes (Signed)
[facility] PROGRESS NOTE  Donna [name] VHQ:[id number] DOB: [birth year] DOA: 12/04/2012 PCP: No primary provider on file.  Assessment/Plan: Principal Problem:   Pulmonary embolism Active Problems:   Anemia   Right ankle sprain   DVT (deep venous thrombosis)    1. Pulmonary embolism/DVT: Patient presented with sudden onset pleuritic-type chest pain, and CTA confirmed small bilateral pulmonary emboli. Patient is not on contraceptive medication, and has no other obvious predispositions, although she works from home, and does tend to be quite sedentary. In addition, she sprained her right ankle on 11/30/12, and this may have diminished her mobility further. LE venous doppler on 12/05/12, revealed DVT in the right PTV and peroneal veins. Managed initially with ivi Heparin infusion. As of 12/06/12, has been transitioned to [facility] Lovenox/Concomittant [place]. Hypercoagulable panel report revealed positive Lupus anticoagulant, but was otherwise, unremarkable. Of note, patient was offered a choice of anticoagulation with Xarelto, vs Coumadin, after pro and cons were discussed in detail. She opted for Coumadin. Following INR. Asymptomatic.  2. Right Ankle sprain: CXR revealed to bony injuries. Managing with analgesics, ankle support and PT/OT. Improving steadily.  3. Anemia: HB was 10.3 on admission, and dropped to 8.3 on 12/05/12, possibly due to hemodilution from iv fluids, which were discontinued on that date. On 12/06/12, HB was 8.8. Anemia is microcytic, and anemia panel revealed iron deficiency. On detailed questioning, patient admits to increasingly heavy menses, which appears to be the likely culprit. Now on iron supplements. Have discussed with Dr Scheryl Darter, OB/GYN at [facility] hospital today, and he has kindly agreed to have patient follow up with him at [facility], [facility], on discharge.   Code Status: Full Code Family Communication:  Disposition Plan: To be determined.   Brief  narrative: 42 year old female with no significant past medical history who presented to [facility] ED with sudden onset chest pain, left sided and radiating to back, 10/10 in intensity, started few days prior to this admission. Patient reported pain was aggravated with breathing and exertion. No fevers or chills. She does have intermittent cough. No palpitations. No abdominal pain, no nausea or vomiting. No lightheadedness or loss of consciousness. In ED, vital were stable and O2 saturation was 95%. CXR showed bronchitic changes and CT angio chest revealed small bilateral pulmonary emboli. Patient started on heparin in ED.   Consultants:  N/A.   Procedures:  CXR.  Right ankle X-Ray.  CTA.   Antibiotics:  N/A.   HPI/Subjective: No new issues.    Objective: Vital signs in last 24 hours: Temp:  [98.4 F (36.9 C)] 98.4 F (36.9 C) (05/25 0500) Pulse Rate:  [87-89] 87 (05/25 0500) Resp:  [16] 16 (05/25 0500) BP: (98-116)/(53-74) 116/74 mmHg (05/25 0500) SpO2:  [100 %] 100 % (05/25 0500) Weight:  [118.7 kg (261 lb 11 oz)] 118.7 kg (261 lb 11 oz) (05/25 0500) Weight change: -0.4 kg (-14.1 oz) Last BM Date: 12/04/12  Intake/Output from previous day: 05/24 0701 - 05/25 0700 In: -  Out: 800 [Urine:800] Total I/O In: 240 [P.O.:240] Out: -    Physical Exam: General: Comfortable, alert, communicative, fully oriented, not short of breath at rest.  HEENT:  Moderate clinical pallor, no jaundice, no conjunctival injection or discharge. NECK:  Supple, JVP not seen, no carotid bruits, no palpable lymphadenopathy, no palpable goiter. CHEST:  Clinically clear to auscultation, no wheezes, no crackles. HEART:  Sounds 1 and 2 heard, normal, regular, no murmurs. ABDOMEN:  Moderately obese, soft, non-tender, no palpable  organomegaly, no palpable masses, normal bowel sounds. GENITALIA:  Not examined. LOWER EXTREMITIES:  No pitting edema, palpable peripheral pulses. MUSCULOSKELETAL SYSTEM:  Has  right ankle support.  CENTRAL NERVOUS SYSTEM:  No focal neurologic deficit on gross examination.  Lab Results:  Recent Labs  12/07/12 0520 12/08/12 0550  WBC 8.2 7.6  HGB 8.3* 8.8*  HCT 28.0* 29.1*  PLT 284 341    Recent Labs  12/06/12 0525 12/07/12 0520  NA 135 135  K 3.9 4.0  CL 104 103  CO2 25 24  GLUCOSE 89 91  BUN 10 15  CREATININE 0.88 1.04  CALCIUM 8.5 8.6   No results found for this or any previous visit (from the past 240 hour(s)).   Studies/Results: No results found.  Medications: Scheduled Meds: . enoxaparin (LOVENOX) injection  120 mg Subcutaneous Q12H  . ferrous sulfate  325 mg Oral TID WC  . oxyCODONE  5 mg Oral TID  . sodium chloride  3 mL Intravenous Q12H  . warfarin  10 mg Oral ONCE-1800  . Warfarin - Pharmacist Dosing Inpatient   Does not apply q1800   Continuous Infusions:   PRN Meds:.acetaminophen, acetaminophen, albuterol, HYDROcodone-acetaminophen, ondansetron (ZOFRAN) IV, ondansetron    LOS: 4 days   Renise Gillies,CHRISTOPHER  [facility] Pager [phone]. If 8PM-8AM, please contact night-coverage at [email or web address], password [email or web address] 12/08/2012, 12:55 PM  LOS: 4 days

## 2012-12-09 LAB — CBC
HCT: 30.7 % — ABNORMAL LOW (ref 36.0–46.0)
MCV: 64.5 fL — ABNORMAL LOW (ref 78.0–100.0)
RBC: 4.76 MIL/uL (ref 3.87–5.11)
RDW: 17.9 % — ABNORMAL HIGH (ref 11.5–15.5)
WBC: 8.4 10*3/uL (ref 4.0–10.5)

## 2012-12-09 LAB — GLUCOSE, CAPILLARY: Glucose-Capillary: 96 mg/dL (ref 70–99)

## 2012-12-09 MED ORDER — WARFARIN SODIUM 7.5 MG PO TABS
15.0000 mg | ORAL_TABLET | Freq: Once | ORAL | Status: AC
  Administered 2012-12-09: 15 mg via ORAL
  Filled 2012-12-09: qty 2

## 2012-12-09 NOTE — Progress Notes (Signed)
ANTICOAGULATION CONSULT NOTE - Follow up Consult  Pharmacy Consult for Lovenox and Coumadin Indication: new bilateral PE and RLE DVT  No Known Allergies  Patient Measurements: Height: 5\' 10"  (177.8 cm) Weight: 262 lb 4.8 oz (118.978 kg) IBW/kg (Calculated) : 68.5  Labs:  Recent Labs  12/07/12 0520 12/07/12 1240 12/08/12 0550 12/09/12 0500  HGB 8.3*  --  8.8* 8.9*  HCT 28.0*  --  29.1* 30.7*  PLT 284  --  341 296  LABPROT  --  13.5 13.8 13.8  INR  --  1.04 1.07 1.07  CREATININE 1.04  --   --   --     Estimated Creatinine Clearance: 99.7 ml/min (by C-G formula based on Cr of 1.04).   Anticoag or Interacting Meds: Lovenox 120mg  SQ q12h Warfarin - 10mg , none, 10mg , 10mg   Assessment: 42 yo F presented 5/21 with c/o SOB, chest pain. CT angio with new bilateral PE and dopplers positive for RLE DVT. Coumadin and IV heparin originally ordered then switched to Lovenox alone and considering Xarelto but on 5/24 resumed Coumadin. Hypercoagulable panel report revealed positive Lupus anticoagulant.  Coumadin score = 9   Today is Day #3 of 5 day minimum overlap with warfarin/Lovenox  INR today is subtherapeutic despite high Coumadin doses. Hgb 8.9 (low but stable), no bleeding reported/documented.    Goal of Therapy:  INR 2-3 Anti-Xa level 0.6-1.2 units/ml 4hrs after LMWH dose given Monitor platelets by anticoagulation protocol: Yes   Plan:   Cont Lovenox 120 mg sq q12h through 5/28 AND until INR > 2 for 2 consecutive days.  Coumadin 52m po x 1 tonight as boosted dose  INR daily.  CBC q72 hours while on Lovenox.  Warfarin education prior to discharge  Geoffry Paradise, PharmD, BCPS Pager: [phone] 8:26 AM Pharmacy #: [phone]

## 2012-12-09 NOTE — Progress Notes (Signed)
Physical Therapy Treatment Patient Details Name: Donna [name] MRN: [id number] DOB: 1971-04-22 Today's Date: 12/09/2012 Time: 1478-2956 PT Time Calculation (min): 10 min  PT Assessment / Plan / Recommendation Comments on Treatment Session  Pt ambulating at independent level and has been compliant with ankle exercises.  No longer requires RW or therapy services.  PT to sign off at this time.     Follow Up Recommendations  No PT follow up     Does the patient have the potential to tolerate intense rehabilitation     Barriers to Discharge        Equipment Recommendations  None recommended by PT    Recommendations for Other Services    Frequency Min 3X/week   Plan Discharge plan remains appropriate    Precautions / Restrictions Precautions Precautions: None Restrictions Weight Bearing Restrictions: No Other Position/Activity Restrictions: WBAT   Pertinent Vitals/Pain     Mobility  Bed Mobility Bed Mobility: Supine to Sit Supine to Sit: 7: Independent Transfers Transfers: Sit to Stand;Stand to Sit Sit to Stand: 7: Independent Stand to Sit: 7: Independent Ambulation/Gait Ambulation/Gait Assistance: 7: Independent Ambulation Distance (Feet): 300 Feet Assistive device: None Stairs: Yes Stairs Assistance: 6: Modified independent (Device/Increase time) Stair Management Technique: One rail Left;Alternating pattern;Forwards Number of Stairs: 10    Exercises     PT Diagnosis:    PT Problem List:   PT Treatment Interventions:     PT Goals Acute Rehab PT Goals PT Goal Formulation: With patient Time For Goal Achievement: 12/14/12 Potential to Achieve Goals: Good Pt will Ambulate: >150 feet;with modified independence;with least restrictive assistive device PT Goal: Ambulate - Progress: Met Pt will Go Up / Down Stairs: 3-5 stairs;with supervision;with rail(s) PT Goal: Up/Down Stairs - Progress: Met Pt will Perform Home Exercise Program: Independently PT Goal:  Perform Home Exercise Program - Progress: Met  Visit Information  Last PT Received On: 12/09/12 Assistance Needed: +1    Subjective Data  Subjective: I'm doing so much better.  Patient Stated Goal: to return home.    Cognition  Cognition Arousal/Alertness: Awake/alert Behavior During Therapy: WFL for tasks assessed/performed Overall Cognitive Status: Within Functional Limits for tasks assessed    Balance     End of Session     GP     Vista Deck 12/09/2012, 3:14 PM

## 2012-12-09 NOTE — Progress Notes (Signed)
[facility] PROGRESS NOTE  Donna [name] ZOX:[id number] DOB: [birth year] DOA: 12/04/2012 PCP: No primary provider on file.  Assessment/Plan: Principal Problem:   Pulmonary embolism Active Problems:   Anemia   Right ankle sprain   DVT (deep venous thrombosis)    1. Pulmonary embolism/DVT: Patient presented with sudden onset pleuritic-type chest pain, and CTA confirmed small bilateral pulmonary emboli. Patient is not on contraceptive medication, and has no other obvious predispositions, although she works from home, and does tend to be quite sedentary. In addition, she sprained her right ankle on 11/30/12, and this may have diminished her mobility further. LE venous doppler on 12/05/12, revealed DVT in the right PTV and peroneal veins. Managed initially with ivi Heparin infusion. As of 12/06/12, has been transitioned to [facility] Lovenox/Concomittant Coumadin. Hypercoagulable panel report revealed positive Lupus anticoagulant, but was otherwise, unremarkable. Of note, patient was offered a choice of anticoagulation with Xarelto, vs Coumadin, after pro and cons were discussed in detail. She opted for Coumadin. Following INR. Asymptomatic.  2. Right Ankle sprain: CXR revealed to bony injuries. Managed with analgesics, ankle support briefly and PT/OT. Improved. Now ambulating without difficulty.  3. Anemia: HB was 10.3 on admission, and dropped to 8.3 on 12/05/12, possibly due to hemodilution from iv fluids, which were discontinued on that date. On 12/06/12, HB was 8.8. Anemia is microcytic, and anemia panel revealed iron deficiency. On detailed questioning, patient admits to increasingly heavy menses, which appears to be the likely culprit. Now on iron supplements. Have discussed with Dr Scheryl Darter, OB/GYN at [facility] hospital today, and he has kindly agreed to have patient follow up with him at [facility], on discharge.   Code Status: Full Code Family Communication:  Disposition  Plan: To be determined.   Brief narrative: 42 year old female with no significant past medical history who presented to [facility] ED with sudden onset chest pain, left sided and radiating to back, 10/10 in intensity, started few days prior to this admission. Patient reported pain was aggravated with breathing and exertion. No fevers or chills. She does have intermittent cough. No palpitations. No abdominal pain, no nausea or vomiting. No lightheadedness or loss of consciousness. In ED, vital were stable and O2 saturation was 95%. CXR showed bronchitic changes and CT angio chest revealed small bilateral pulmonary emboli. Patient started on heparin in ED.   Consultants:  N/A.   Procedures:  CXR.  Right ankle X-Ray.  CTA.   Antibiotics:  N/A.   HPI/Subjective: Asymptomatic.    Objective: Vital signs in last 24 hours: Temp:  [97.8 F (36.6 C)-98.1 F (36.7 C)] 98.1 F (36.7 C) (05/26 1300) Pulse Rate:  [76-92] 76 (05/26 1300) Resp:  [18] 18 (05/26 0530) BP: (102-112)/(63-69) 112/69 mmHg (05/26 1300) SpO2:  [100 %] 100 % (05/26 1300) Weight:  [118.978 kg (262 lb 4.8 oz)] 118.978 kg (262 lb 4.8 oz) (05/26 0530) Weight change: 0.278 kg (9.8 oz) Last BM Date: 12/09/12  Intake/Output from previous day: 05/25 0701 - 05/26 0700 In: 363 [P.O.:360; I.V.:3] Out: -      Physical Exam: General: Comfortable, alert, communicative, fully oriented, not short of breath at rest.  HEENT:  Moderate clinical pallor, no jaundice, no conjunctival injection or discharge. NECK:  Supple, JVP not seen, no carotid bruits, no palpable lymphadenopathy, no palpable goiter. CHEST:  Clinically clear to auscultation, no wheezes, no crackles. HEART:  Sounds 1 and 2 heard, normal, regular, no murmurs. ABDOMEN:  Moderately obese, soft, non-tender, no palpable  organomegaly, no palpable masses, normal bowel sounds. GENITALIA:  Not examined. LOWER EXTREMITIES:  No pitting edema, palpable peripheral  pulses. MUSCULOSKELETAL SYSTEM:  Unremarkable.  CENTRAL NERVOUS SYSTEM:  No focal neurologic deficit on gross examination.  Lab Results:  Recent Labs  12/08/12 0550 12/09/12 0500  WBC 7.6 8.4  HGB 8.8* 8.9*  HCT 29.1* 30.7*  PLT 341 296    Recent Labs  12/07/12 0520  NA 135  K 4.0  CL 103  CO2 24  GLUCOSE 91  BUN 15  CREATININE 1.04  CALCIUM 8.6   No results found for this or any previous visit (from the past 240 hour(s)).   Studies/Results: No results found.  Medications: Scheduled Meds: . enoxaparin (LOVENOX) injection  120 mg Subcutaneous Q12H  . ferrous sulfate  325 mg Oral TID WC  . oxyCODONE  5 mg Oral TID  . sodium chloride  3 mL Intravenous Q12H  . Warfarin - Pharmacist Dosing Inpatient   Does not apply q1800   Continuous Infusions:   PRN Meds:.acetaminophen, acetaminophen, albuterol, HYDROcodone-acetaminophen, ondansetron (ZOFRAN) IV, ondansetron    LOS: 5 days   Vedanshi Massaro,CHRISTOPHER  [facility] Pager [phone]. If 8PM-8AM, please contact night-coverage at [email or web address], password [facility] 12/09/2012, 6:29 PM  LOS: 5 days

## 2012-12-10 LAB — PROTIME-INR
INR: 1.32 (ref 0.00–1.49)
Prothrombin Time: 16.1 seconds — ABNORMAL HIGH (ref 11.6–15.2)

## 2012-12-10 LAB — GLUCOSE, CAPILLARY: Glucose-Capillary: 95 mg/dL (ref 70–99)

## 2012-12-10 MED ORDER — WARFARIN SODIUM 7.5 MG PO TABS
15.0000 mg | ORAL_TABLET | Freq: Once | ORAL | Status: AC
  Administered 2012-12-10: 15 mg via ORAL
  Filled 2012-12-10: qty 2

## 2012-12-10 NOTE — Progress Notes (Signed)
ANTICOAGULATION CONSULT NOTE - Follow up Consult  Pharmacy Consult for Lovenox and Coumadin Indication: new bilateral PE and RLE DVT  No Known Allergies  Patient Measurements: Height: 5\' 10"  (177.8 cm) Weight: 260 lb 5.8 oz (118.1 kg) IBW/kg (Calculated) : 68.5  Labs:  Recent Labs  12/08/12 0550 12/09/12 0500 12/10/12 0428  HGB 8.8* 8.9*  --   HCT 29.1* 30.7*  --   PLT 341 296  --   LABPROT 13.8 13.8 16.1*  INR 1.07 1.07 1.32    Estimated Creatinine Clearance: 99.2 ml/min (by C-G formula based on Cr of 1.04).   Anticoag or Interacting Meds: Lovenox 120mg  SQ q12h Warfarin - 10mg , none, 10mg , 10mg , 15mg   Assessment: 42 yo F presented 5/21 with c/o SOB, chest pain. CT angio with new bilateral PE and dopplers positive for RLE DVT. Coumadin and IV heparin originally ordered then switched to Lovenox alone and considering Xarelto but on 5/24 resumed Coumadin. Hypercoagulable panel report revealed positive Lupus anticoagulant.    Today is Day #4 of 5 day minimum overlap with Warfarin/Lovenox  INR only slightly resonding (1.32) despite high Coumadin doses.   Hgb 8.9 (low but stable), no bleeding reported/documented.    Goal of Therapy:  INR 2-3 Anti-Xa level 0.6-1.2 units/ml 4hrs after LMWH dose given Monitor platelets by anticoagulation protocol: Yes   Plan:   Cont Lovenox 120 mg sq q12h until INR > 2 for 2 consecutive days.  Warfarin 15mg  at 12 noon today  INR daily  CBC q72 hours while on Lovenox.  Warfarin education prior to discharge  Loralee Pacas, PharmD, BCPS Pager: [phone] 12/10/2012 9:58 AM

## 2012-12-10 NOTE — Progress Notes (Signed)
[facility] PROGRESS NOTE  Donna [name] ZOX:[id number] DOB: 10/04/1970 DOA: 12/04/2012 PCP: No primary provider on file.  Assessment/Plan: Principal Problem:   Pulmonary embolism Active Problems:   Anemia   Right ankle sprain   DVT (deep venous thrombosis)    1. Pulmonary embolism/DVT: Patient presented with sudden onset pleuritic-type chest pain, and CTA confirmed small bilateral pulmonary emboli. Patient is not on contraceptive medication, and has no other obvious predispositions, although she works from home, and does tend to be quite sedentary. In addition, she sprained her right ankle on 11/30/12, and this may have diminished her mobility further. LE venous doppler on 12/05/12, revealed DVT in the right PTV and peroneal veins. Managed initially with ivi Heparin infusion. As of 12/06/12, has been transitioned to [facility] Lovenox/Concomittant Coumadin. Hypercoagulable panel report revealed positive Lupus anticoagulant, but was otherwise, unremarkable. Of note, patient was offered a choice of anticoagulation with Xarelto, vs Coumadin, after pro and cons were discussed in detail. She opted for Coumadin. Following INR. Asymptomatic. Continue current management.  2. Right Ankle sprain: CXR revealed to bony injuries. Managed with analgesics, ankle support briefly and PT/OT. Improved. Now ambulating without difficulty.  3. Anemia: HB was 10.3 on admission, and dropped to 8.3 on 12/05/12, possibly due to hemodilution from iv fluids, which were discontinued on that date. On 12/06/12, HB was 8.8. Anemia is microcytic, and anemia panel revealed iron deficiency. On detailed questioning, patient admits to increasingly heavy menses, which appears to be the likely culprit. Now on iron supplements. Have discussed with Dr Scheryl Darter, OB/GYN at [facility] hospital today, and he has kindly agreed to have patient follow up with him at [facility], [facility], on discharge.   Code Status: Full Code Family  Communication:  Disposition Plan: To be determined. Aiming discharge soon.    Brief narrative: 42 year old female with no significant past medical history who presented to [facility] ED with sudden onset chest pain, left sided and radiating to back, 10/10 in intensity, started few days prior to this admission. Patient reported pain was aggravated with breathing and exertion. No fevers or chills. She does have intermittent cough. No palpitations. No abdominal pain, no nausea or vomiting. No lightheadedness or loss of consciousness. In ED, vital were stable and O2 saturation was 95%. CXR showed bronchitic changes and CT angio chest revealed small bilateral pulmonary emboli. Patient started on heparin in ED.   Consultants:  N/A.   Procedures:  CXR.  Right ankle X-Ray.  CTA.   Antibiotics:  N/A.   HPI/Subjective: No new issues. Period started today.   Objective: Vital signs in last 24 hours: Temp:  [98 F (36.7 C)-98.6 F (37 C)] 98 F (36.7 C) (05/27 0546) Pulse Rate:  [89-90] 90 (05/27 0546) Resp:  [16] 16 (05/27 0546) BP: (107-112)/(64-66) 112/66 mmHg (05/27 0546) SpO2:  [99 %] 99 % (05/27 0546) Weight:  [118.1 kg (260 lb 5.8 oz)] 118.1 kg (260 lb 5.8 oz) (05/27 0546) Weight change: -0.879 kg (-1 lb 15 oz) Last BM Date: 12/09/12  Intake/Output from previous day: 05/26 0701 - 05/27 0700 In: 483 [P.O.:480; I.V.:3] Out: -  Total I/O In: 240 [P.O.:240] Out: -    Physical Exam: General: Comfortable, alert, communicative, fully oriented, not short of breath at rest.  HEENT:  Moderate clinical pallor, no jaundice, no conjunctival injection or discharge. NECK:  Supple, JVP not seen, no carotid bruits, no palpable lymphadenopathy, no palpable goiter. CHEST:  Clinically clear to auscultation, no wheezes, no crackles.  HEART:  Sounds 1 and 2 heard, normal, regular, no murmurs. ABDOMEN:  Moderately obese, soft, non-tender, no palpable organomegaly, no palpable masses, normal bowel  sounds. GENITALIA:  Not examined. LOWER EXTREMITIES:  No pitting edema, palpable peripheral pulses. MUSCULOSKELETAL SYSTEM:  Unremarkable.  CENTRAL NERVOUS SYSTEM:  No focal neurologic deficit on gross examination.  Lab Results:  Recent Labs  12/08/12 0550 12/09/12 0500  WBC 7.6 8.4  HGB 8.8* 8.9*  HCT 29.1* 30.7*  PLT 341 296   No results found for this basename: NA, K, CL, CO2, GLUCOSE, BUN, CREATININE, CALCIUM,  in the last 72 hours No results found for this or any previous visit (from the past 240 hour(s)).   Studies/Results: No results found.  Medications: Scheduled Meds: . enoxaparin (LOVENOX) injection  120 mg Subcutaneous Q12H  . ferrous sulfate  325 mg Oral TID WC  . oxyCODONE  5 mg Oral TID  . sodium chloride  3 mL Intravenous Q12H  . Warfarin - Pharmacist Dosing Inpatient   Does not apply q1800   Continuous Infusions:   PRN Meds:.acetaminophen, acetaminophen, albuterol, HYDROcodone-acetaminophen, ondansetron (ZOFRAN) IV, ondansetron    LOS: 6 days   Taige Housman,CHRISTOPHER  [facility] Pager [phone]. If 8PM-8AM, please contact night-coverage at [email or web address], password [facility] 12/10/2012, 1:58 PM  LOS: 6 days

## 2012-12-11 LAB — PROTIME-INR: Prothrombin Time: 20.6 seconds — ABNORMAL HIGH (ref 11.6–15.2)

## 2012-12-11 MED ORDER — FERROUS SULFATE 325 (65 FE) MG PO TABS: 325.0000 mg | ORAL_TABLET | Freq: Three times a day (TID) | ORAL | Status: DC

## 2012-12-11 MED ORDER — ENOXAPARIN SODIUM 120 MG/0.8ML ~~LOC~~ SOLN: 120.0000 mg | Freq: Two times a day (BID) | SUBCUTANEOUS | Status: DC

## 2012-12-11 MED ORDER — WARFARIN SODIUM 7.5 MG PO TABS: 15.0000 mg | ORAL_TABLET | Freq: Once | ORAL | Status: DC

## 2012-12-11 MED ORDER — WARFARIN SODIUM 7.5 MG PO TABS
15.0000 mg | ORAL_TABLET | Freq: Once | ORAL | Status: DC
  Filled 2012-12-11: qty 2

## 2012-12-11 MED ORDER — HYDROCODONE-ACETAMINOPHEN 5-325 MG PO TABS: 1.0000 | ORAL_TABLET | ORAL | Status: DC | PRN

## 2012-12-11 NOTE — Progress Notes (Signed)
Spoke with Dr. Benjamine Mola regarding d/c instructions for Coumadin. MD clarified pt is to take 15mg  po daily until follow up appointment on Friday. Pt was informed and d/c AVS and prescription were both updated.

## 2012-12-11 NOTE — Progress Notes (Addendum)
ANTICOAGULATION CONSULT NOTE - Follow up Consult  Pharmacy Consult for Lovenox and Coumadin Indication: New Bilateral PE and RLE DVT  No Known Allergies  Patient Measurements: Height: 5\' 10"  (177.8 cm) Weight: 258 lb 2.5 oz (117.1 kg) IBW/kg (Calculated) : 68.5  Labs:  Recent Labs  12/09/12 0500 12/10/12 0428 12/11/12 0511  HGB 8.9*  --   --   HCT 30.7*  --   --   PLT 296  --   --   LABPROT 13.8 16.1* 20.6*  INR 1.07 1.32 1.84*    Estimated Creatinine Clearance: 98.8 ml/min (by C-G formula based on Cr of 1.04).   Anticoag or Interacting Meds: Lovenox 120mg  SQ q12h Warfarin - 10mg , none, 10mg , 10mg , 15mg , 15mg   Assessment: 42 yo F presented 5/21 with c/o SOB, chest pain. CT angio with new bilateral PE and dopplers positive for RLE DVT. Coumadin and IV heparin originally ordered then switched to Lovenox alone and considered Xarelto but on 5/24 resumed Coumadin. Hypercoagulable panel report revealed positive Lupus anticoagulant.    Today is Day #5 of 5 day minimum overlap with Warfarin/Lovenox  INR only starting to respond to high Coumadin doses, but still subtherapeutic  Hgb 8.9 (low but stable), no bleeding reported/documented.    Goal of Therapy:  INR 2-3 Anti-Xa level 0.6-1.2 units/ml 4hrs after LMWH dose given Monitor platelets by anticoagulation protocol: Yes   Plan:   Cont Lovenox 120 mg sq q12h until INR > 2 for 2 consecutive days  If patient going home today, suggest INR check this Friday 5/30 and writing prescription for enough Lovenox to get her to that appointment  Repeat Warfarin 15mg  today  Suggest sending patient home on warfarin 15mg  daily until INR check on Friday as outpatient.  Darrol Angel, PharmD Pager: [phone] 12/11/2012 10:11 AM  ADDENDUM: Standard warfarin education completed with patient. Patient mentioned she'd had a brief discussion with MD about coumadin and had read some of the coumadin book already. All questions answered,  no further questions at this time.  Darrol Angel, PharmD Pager: [phone] 12/11/2012 10:46 AM

## 2012-12-11 NOTE — Discharge Summary (Signed)
Physician Discharge Summary  Donna [name] ZOX:[id number] DOB: 1971-06-06 DOA: 12/04/2012  PCP: No primary provider on file.  Admit date: 12/04/2012 Discharge date: 12/11/2012  Time spent: 35 minutes  Recommendations for Outpatient Follow-up:  1. PT/INR on Friday- sent on coumadin/lovenox  Discharge Diagnoses:  Principal Problem:   Pulmonary embolism Active Problems:   Anemia   Right ankle sprain   DVT (deep venous thrombosis)   Discharge Condition: improved  Diet recommendation: regular  Filed Weights   12/09/12 0530 12/10/12 0546 12/11/12 0600  Weight: 118.978 kg (262 lb 4.8 oz) 118.1 kg (260 lb 5.8 oz) 117.1 kg (258 lb 2.5 oz)    History of present illness:  42 year old female with no significant past medical history who presented to [facility] ED with sudden onset chest pain, left sided and radiating to back, 10/10 in intensity, started few days prior to this admission. Patient reported pain was aggravated with breathing and exertion. No fevers or chills. She does have intermittent cough. No palpitations. No abdominal pain, no nausea or vomiting. No lightheadedness or loss of consciousness.  In ED, vital are stable and O2 saturation is 95%. CXR showed bronchitic changes and CT angio chest revealed small bilateral pulmonary emboli. Patient started on heparin in ED.      Hospital Course:  1. Pulmonary embolism/DVT: Patient presented with sudden onset pleuritic-type chest pain, and CTA confirmed small bilateral pulmonary emboli. Patient is not on contraceptive medication, and has no other obvious predispositions, although she works from home, and does tend to be quite sedentary. In addition, she sprained her right ankle on 11/30/12, and this may have diminished her mobility further. LE venous doppler on 12/05/12, revealed DVT in the right PTV and peroneal veins. Managed initially with ivi Heparin infusion. As of 12/06/12, has been transitioned to [facility] Lovenox/Concomittant Coumadin.  Hypercoagulable panel report revealed positive Lupus anticoagulant, but was otherwise, unremarkable. Of note, patient was offered a choice of anticoagulation with Xarelto, vs Coumadin, after pro and cons were discussed in detail. She opted for Coumadin. Following INR -has been able to give self lovenox so will d/c with close outpt follow up   2. Right Ankle sprain: XRay revealed no bony injuries. Managed with analgesics, ankle support briefly and PT/OT. Improved. Now ambulating without difficulty.   3. Anemia: HB was 10.3 on admission, and dropped to 8.3 on 12/05/12, possibly due to hemodilution from iv fluids, which were discontinued on that date. On 12/06/12, HB was 8.8. Anemia is microcytic, and anemia panel revealed iron deficiency. On detailed questioning, patient admits to increasingly heavy menses, which appears to be the likely culprit. Now on iron supplements. discussed with Dr Scheryl Darter, OB/GYN at [facility] hospital, and he has kindly agreed to have patient follow up with him at [facility], on discharge.     Discharge Exam: Filed Vitals:   12/10/12 1415 12/10/12 2119 12/11/12 0442 12/11/12 0600  BP: 110/73 115/73 113/69   Pulse: 81 82 87   Temp: 98.4 F (36.9 C) 98 F (36.7 C) 97.9 F (36.6 C)   TempSrc: Oral Oral Oral   Resp: 18 16 16    Height:      Weight:    117.1 kg (258 lb 2.5 oz)  SpO2: 100% 100% 100%     General: A+Ox3, NAD Cardiovascular: rrr Respiratory: clear anterior  Discharge Instructions      Discharge Orders   Future Orders Complete By Expires     Diet general  As directed  Discharge instructions  As directed     Comments:      PT/INR on Friday- walk into clinic CBC 1 week RE: Hgb Continue lovenox until d/c'd at clinic on Friday- they will adjust coumadin/lovenox    Increase activity slowly  As directed         Medication List    STOP taking these medications       ibuprofen 200 MG tablet  Commonly known as:  ADVIL,MOTRIN      naproxen sodium 220 MG tablet  Commonly known as:  ANAPROX      TAKE these medications       enoxaparin 120 MG/0.8ML injection  Commonly known as:  LOVENOX  Inject 0.8 mLs (120 mg total) into the skin every 12 (twelve) hours.     ferrous sulfate 325 (65 FE) MG tablet  Take 1 tablet (325 mg total) by mouth 3 (three) times daily with meals.     HYDROcodone-acetaminophen 5-325 MG per tablet  Commonly known as:  NORCO/VICODIN  Take 1 tablet by mouth every 4 (four) hours as needed.     simethicone 80 MG chewable tablet  Commonly known as:  MYLICON  Chew 80 mg by mouth every 6 (six) hours as needed for flatulence.     warfarin 7.5 MG tablet  Commonly known as:  COUMADIN  Take 2 tablets (15 mg total) by mouth one time only at 6 PM.       No Known Allergies Follow-up Information   Follow up with Jeoffrey Massed, MD. (pt/inr. PCP.can walk in;M-F 10-6p)    Contact information:   [address] [place] [address] [phone]       Call Scheryl Darter, MD.   Contact information:   [address] [place] [address] [phone]        The results of significant diagnostics from this hospitalization (including imaging, microbiology, ancillary and laboratory) are listed below for reference.    Significant Diagnostic Studies: Dg Chest 2 View  12/04/2012   *RADIOLOGY REPORT*  Clinical Data: Left-sided chest pain and shortness of breath.  CHEST - 2 VIEW  Comparison: 07/20/2012.  Findings: The cardiac silhouette, mediastinal and hilar contours are normal and stable.  Mild bronchitic lung changes and left greater than right basilar atelectasis.  No focal airspace consolidation or pleural effusion.  IMPRESSION: Bronchitic type lung changes and bibasilar atelectasis, left greater than right.   Original Report Authenticated By: Rudie Meyer, M.D.   Dg Ankle Complete Right  12/04/2012   *RADIOLOGY REPORT*  Clinical Data: Injured right ankle.  RIGHT ANKLE - COMPLETE 3+  VIEW  Comparison: None  Findings: The ankle mortise is maintained.  No acute ankle fracture.  No osteochondral lesion.  No joint effusion.  The mid and hind foot bony structures are intact.  IMPRESSION: No acute bony findings.   Original Report Authenticated By: Rudie Meyer, M.D.   Ct Angio Chest Pe W/cm &/or Wo Cm  12/05/2012   *RADIOLOGY REPORT*  Clinical Data: Chest pain and shortness of breath.  CT ANGIOGRAPHY CHEST  Technique:  Multidetector CT imaging of the chest using the standard protocol during bolus administration of intravenous contrast. Multiplanar reconstructed images including MIPs were obtained and reviewed to evaluate the vascular anatomy.  Contrast: OMNIPAQUE IOHEXOL 350 MG/ML SOLN  Comparison: CT chest 07/20/2012.  Findings: The chest wall is unremarkable and stable.  No breast masses, supraclavicular or axillary adenopathy.  Small scattered benign appearing fatty lymph nodes are noted.  The bony  thorax is intact.  The heart is normal in size.  No pericardial effusion.  No mediastinal or hilar lymphadenopathy.  Small scattered lymph nodes are noted.  The esophagus is grossly normal. A small hiatal hernia is noted.  The aorta is normal in caliber. No dissection.  The pulmonary arterial tree is fairly well opacified.  There are bilateral pulmonary emboli.  Examination of the lung parenchyma demonstrates patchy subsegmental bibasilar atelectasis.  No worrisome mass lesions or bronchiectasis.  The upper abdomen is unremarkable.  IMPRESSION:  1.  Small bilateral pulmonary emboli. 2.  Normal thoracic aorta. 3.  Patchy bibasilar subsegmental atelectasis.   Original Report Authenticated By: Rudie Meyer, M.D.    Microbiology: No results found for this or any previous visit (from the past 240 hour(s)).   Labs: Basic Metabolic Panel:  Recent Labs Lab 12/05/12 0047 12/05/12 0640 12/06/12 0525 12/07/12 0520  NA 136 132* 135 135  K 5.0 3.6 3.9 4.0  CL 106 102 104 103  CO2  --  22 25  24   GLUCOSE 90 93 89 91  BUN 16 10 10 15   CREATININE 0.90 0.87 0.88 1.04  CALCIUM  --  8.2* 8.5 8.6   Liver Function Tests:  Recent Labs Lab 12/05/12 0640  AST 8  ALT 8  ALKPHOS 59  BILITOT 0.3  PROT 6.1  ALBUMIN 2.4*   No results found for this basename: LIPASE, AMYLASE,  in the last 168 hours No results found for this basename: AMMONIA,  in the last 168 hours CBC:  Recent Labs Lab 12/05/12 0640 12/06/12 0525 12/07/12 0520 12/08/12 0550 12/09/12 0500  WBC 8.8 7.7 8.2 7.6 8.4  HGB 8.3* 8.8* 8.3* 8.8* 8.9*  HCT 27.3* 28.3* 28.0* 29.1* 30.7*  MCV 63.5* 63.7* 63.6* 63.8* 64.5*  PLT 240 288 284 341 296   Cardiac Enzymes: No results found for this basename: CKTOTAL, CKMB, CKMBINDEX, TROPONINI,  in the last 168 hours BNP: BNP (last 3 results)  Recent Labs  12/05/12 0510  PROBNP 38.4   CBG:  Recent Labs Lab 12/06/12 0733 12/07/12 0750 12/08/12 0727 12/09/12 0729 12/10/12 0731  GLUCAP 92 85 87 96 95       Signed:  Addam Goeller  [facility] 12/11/2012, 10:58 AM

## 2012-12-13 ENCOUNTER — Ambulatory Visit: Payer: Medicaid Other | Attending: Family Medicine | Admitting: Family Medicine

## 2012-12-13 ENCOUNTER — Other Ambulatory Visit: Payer: Self-pay

## 2012-12-13 VITALS — BP 102/72 | HR 80 | Temp 98.2°F | Resp 18 | Ht 70.0 in | Wt 263.0 lb

## 2012-12-13 DIAGNOSIS — R894 Abnormal immunological findings in specimens from other organs, systems and tissues: Secondary | ICD-10-CM

## 2012-12-13 DIAGNOSIS — D649 Anemia, unspecified: Secondary | ICD-10-CM

## 2012-12-13 DIAGNOSIS — E669 Obesity, unspecified: Secondary | ICD-10-CM

## 2012-12-13 DIAGNOSIS — R760 Raised antibody titer: Secondary | ICD-10-CM

## 2012-12-13 DIAGNOSIS — I2699 Other pulmonary embolism without acute cor pulmonale: Secondary | ICD-10-CM

## 2012-12-13 NOTE — Progress Notes (Signed)
Patient states that she had enough Lovenox to get through to today, but needs a PT INR and someone to manage anticoagulation therapy.

## 2012-12-13 NOTE — Patient Instructions (Signed)
Warfarin tablets What is this medicine? WARFARIN (WAR far in) is an anticoagulant. It is used to treat or prevent clots in the veins, arteries, lungs, or heart. This medicine may be used for other purposes; ask your health care provider or pharmacist if you have questions. What should I tell my health care provider before I take this medicine? They need to know if you have any of these conditions: -alcoholism -anemia -bleeding disorders -cancer -diabetes -heart disease -high blood pressure -history of bleeding in the gastrointestinal tract -history of stroke or other brain injury or disease -kidney or liver disease -protein C deficiency -protein S deficiency -psychosis or dementia -recent injury, recent or planned surgery or procedure -an unusual or allergic reaction to warfarin, other medicines, foods, dyes, or preservatives -pregnant or trying to get pregnant -breast-feeding How should I use this medicine? Take this medicine by mouth with a glass of water. Follow the directions on the prescription label. You can take this medicine with or without food. Take your medicine at the same time each day. Do not take it more often than directed. Do not stop taking except on the advice of your doctor or health care professional. A special MedGuide will be given to you by the pharmacist with each prescription and refill. Be sure to read this information carefully each time. Talk to your pediatrician regarding the use of this medicine in children. Special care may be needed. Overdosage: If you think you have taken too much of this medicine contact a poison control center or emergency room at once. NOTE: This medicine is only for you. Do not share this medicine with others. What if I miss a dose? It is important not to miss a dose. If you miss a dose, call your healthcare provider. Take the dose as soon as possible on the same day. If it is almost time for your next dose, take only that dose. Do  not take double or extra doses to make up for a missed dose. What may interact with this medicine? Do not take this medicine with any of the following medications: -agents that prevent or dissolve blood clots -aspirin or other salicylates -danshen -dextrothyroxine -mifepristone -St. John's Wort -red yeast rice This medicine may also interact with the following medications: -acetaminophen -agents that lower cholesterol -alcohol -allopurinol -amiodarone -antibiotics or medicines for treating bacterial, fungal or viral infections -azathioprine -barbiturate medicines for inducing sleep or treating seizures -certain medicines for diabetes -certain medicines for heart rhythm problems -certain medicines for high blood pressure -chloral hydrate -cisapride -disulfiram -female hormones, including contraceptive or birth control pills -general anesthetics -herbal or dietary products like cranberry, garlic, ginkgo, ginseng, green tea, or kava kava -influenza virus vaccine -female hormones -medicines for mental depression or psychosis -medicines for some types of cancer -medicines for stomach problems -methylphenidate -NSAIDs, medicines for pain and inflammation, like ibuprofen or naproxen -propoxyphene -quinidine, quinine -raloxifene -seizure or epilepsy medicine like carbamazepine, phenytoin, and valproic acid -steroids like cortisone and prednisone -tamoxifen -thyroid medicine -tramadol -vitamin c, vitamin e, and vitamin K -zafirlukast -zileuton This list may not describe all possible interactions. Give your health care provider a list of all the medicines, herbs, non-prescription drugs, or dietary supplements you use. Also tell them if you smoke, drink alcohol, or use illegal drugs. Some items may interact with your medicine. What should I watch for while using this medicine? Visit your doctor or health care professional for regular checks on your progress. You will need to have  your blood   checked regularly to make sure you are getting the right dose of this medicine. When you first start taking this medicine, these tests are done often. Once the correct dose is determined and you take your medicine properly, these tests can be done less often. While you are taking this medicine, carry an identification card with your name, the name and dose of medicine(s) being used, and the name and phone number of your doctor or health care professional or person to contact in an emergency. Do not start taking or stop taking any medicines or over-the-counter medicines except on the advice of your doctor or health care professional. You should discuss your diet with your doctor or health care professional. Do not make major changes in your diet. Many foods contain high amounts of vitamin K, which can interfere with the effect of this medicine. Your doctor or health care professional may want you to limit your intake of foods that contain vitamin K. Some foods that have moderate to high amounts of vitamin K are green leafy vegetables like beet greens, collard greens, endive, kale, mustard greens, spinach, turnip greens, watercress, and certain lettuces like green leaf or romaine. Some other foods that have high to moderate amounts of vitamin K are asparagus, black eye peas, broccoli, brussels sprouts, cabbage, cucumber with peel, okra, peas, parsley, and green onions. This medicine can cause birth defects or bleeding in an unborn child. Women of childbearing age should use effective birth control while taking this medicine. If a woman becomes pregnant while taking this medicine, she should discuss the potential risks and her options with her health care professional. Avoid sports and activities that might cause injury while you are using this medicine. Severe falls or injuries can cause unseen bleeding. Be careful when using sharp tools or knives. Consider using an electric razor. Take special care  brushing or flossing your teeth. Report any injuries, bruising, or red spots on the skin to your doctor or health care professional. If you have an illness that causes vomiting, diarrhea, or fever for more than a few days, contact your doctor. Also check with your doctor if you are unable to eat for several days. These problems can change the effect of this medicine. Even after you stop taking this medicine, it takes several days before your body recovers its normal ability to clot blood. Ask your doctor or health care professional how long you need to be careful. If you are going to have surgery or dental work, tell your doctor or health care professional that you have been taking this medicine. What side effects may I notice from receiving this medicine? Side effects that you should report to your doctor or health care professional as soon as possible: -back or stomach pain -chest pain or fast or irregular heartbeat -difficulty breathing or talking, wheezing -dizziness -fever or chills -headaches -heavy menstrual bleeding or vaginal bleeding -nausea, vomiting -painful, blue, or purple toes -painful, prolonged erection -signs and symptoms of bleeding such as bloody or black, tarry stools, red or dark-brown urine, spitting up blood or brown material that looks like coffee grounds, red spots on the skin, unusual bruising or bleeding from the eye, gums, or nose -skin rash, itching or skin damage -unusual swelling or sudden weight gain -unusually weak or tired -yellowing of skin or eyes Side effects that usually do not require medical attention (report to your doctor or health care professional if they continue or are bothersome): -diarrhea -unusual hair loss This list may not   describe all possible side effects. Call your doctor for medical advice about side effects. You may report side effects to FDA at 1-800-FDA-1088. Where should I keep my medicine? Keep out of the reach of children. Store  at room temperature between 15 and 30 degrees C (59 and 86 degrees F). Protect from light. Throw away any unused medicine after the expiration date. NOTE: This sheet is a summary. It may not cover all possible information. If you have questions about this medicine, talk to your doctor, pharmacist, or health care provider.  2013, [employer]. (06/01/2011 12:08:27 PM)  

## 2012-12-13 NOTE — Progress Notes (Signed)
Subjective:     Patient ID: Donna [name], female   DOB: [birth year], 42 y.o.   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 She is taking 15mg  coumadin per day, having no bleeding problems, and is feeling well. She was positive for lupus anticoagulant. INR yesterday was 1.8. She has one day left of lovenox.   She also was found to be anemic and has a h/o heavy menses. She is following up with gyn for this.    Review of Systems per hpi     Objective:   Physical Exam  Nursing note and vitals reviewed. Constitutional: She appears well-developed and well-nourished.  Cardiovascular: Normal rate, regular rhythm and normal heart sounds.   Pulmonary/Chest: Effort normal and breath sounds normal.  Psychiatric: She has a normal mood and affect.       Assessment:     Pulmonary embolus - Plan: Protime-INR  Pulmonary embolism  Anemia       Plan:     PE - repeat INR Monday. Today is day 7, if not above 2 by Monday will need to increase dose.   Anemia - f/u with gyn, cont iron  Lupus anticoag - cont coumadin, f/u with hematology  obestiy - dietary and lifestyle modifications  Will see her back in 2 mos, earlier if needed. Lab visits for coumadin monitoring until then.

## 2012-12-16 ENCOUNTER — Ambulatory Visit: Payer: Medicaid Other | Attending: Family Medicine | Admitting: Family Medicine

## 2012-12-16 ENCOUNTER — Telehealth: Payer: Self-pay | Admitting: *Deleted

## 2012-12-16 VITALS — BP 98/66 | HR 81 | Temp 97.7°F | Resp 16

## 2012-12-16 DIAGNOSIS — Z7901 Long term (current) use of anticoagulants: Secondary | ICD-10-CM

## 2012-12-16 DIAGNOSIS — I2699 Other pulmonary embolism without acute cor pulmonale: Secondary | ICD-10-CM

## 2012-12-16 DIAGNOSIS — D6862 Lupus anticoagulant syndrome: Secondary | ICD-10-CM | POA: Insufficient documentation

## 2012-12-16 DIAGNOSIS — N920 Excessive and frequent menstruation with regular cycle: Secondary | ICD-10-CM | POA: Insufficient documentation

## 2012-12-16 DIAGNOSIS — R51 Headache: Secondary | ICD-10-CM | POA: Insufficient documentation

## 2012-12-16 DIAGNOSIS — D6859 Other primary thrombophilia: Secondary | ICD-10-CM

## 2012-12-16 NOTE — Progress Notes (Signed)
Patient states that she needs refill on coumadin and PT INR; states she has bad headache that started Friday night.

## 2012-12-16 NOTE — Progress Notes (Signed)
12/16/12 Patient made aware of INR 3.0  Therapeutic per Dr. Laural Benes  Continue current dose of  Warfarin will recheck PT/INR  In 2 days Wednesday appointment  12/18/12 P.Luiz Ochoa

## 2012-12-16 NOTE — Progress Notes (Signed)
Quick Note:  Please call patient and inform her that her INR is therapeutic now at 3.0. Continue current dose of warfarin but need to recheck the PT/INR in 2 days. Please make her lab appointment to have PT checked in 2 days.   Rodney Langton, MD, CDE, FAAFP [facility] [place], [place]   ______

## 2012-12-16 NOTE — Progress Notes (Signed)
Patient ID: Donna [name], female   DOB: 24-Aug-1970, 42 y.o.   MRN: [id number]  CC:  Follow up   HPI: Pt. Says that she has had 3 days of headache on left side, similar to migraine that she has had in the past.  Pt says that she is used to taking BC powders but reports that she has not been able to take because of being on blood thinners.   Pt says that otherwise she has been well.  No shortness of breath or chest pain.  No fever or chills reported.  Pt says that she has been taking her warfarin daily with no problems.  She was able to get her prescriptions filled with no problems.  Pt has completed her course of lovenox.    No Known Allergies Past Medical History  Diagnosis Date  . Lung collapse   . Pulmonary embolism, blood-clot, obstetric    Current Outpatient Prescriptions on File Prior to Visit  Medication Sig Dispense Refill  . HYDROcodone-acetaminophen (NORCO/VICODIN) 5-325 MG per tablet Take 1 tablet by mouth every 4 (four) hours as needed.  30 tablet  0  . warfarin (COUMADIN) 7.5 MG tablet Take 2 tablets (15 mg total) by mouth one time only at 6 PM.  15 tablet  0  . ferrous sulfate 325 (65 FE) MG tablet Take 1 tablet (325 mg total) by mouth 3 (three) times daily with meals.  90 tablet  3   No current facility-administered medications on file prior to visit.   Family History  Problem Relation Age of Onset  . COPD Father    History   Social History  . Marital Status: Single    Spouse Name: N/A    Number of Children: N/A  . Years of Education: N/A   Occupational History  . Not on file.   Social History Main Topics  . Smoking status: Never Smoker   . Smokeless tobacco: Not on file  . Alcohol Use: Yes  . Drug Use: No  . Sexually Active: Not on file   Other Topics Concern  . Not on file   Social History Narrative  . No narrative on file    Review of Systems  Constitutional: Negative for fever, chills, diaphoresis, activity change, appetite change and fatigue.   HENT: Negative for ear pain, nosebleeds, congestion, facial swelling, rhinorrhea, neck pain, neck stiffness and ear discharge.   Eyes: Negative for pain, discharge, redness, itching and visual disturbance.  Respiratory: Negative for cough, choking, chest tightness, shortness of breath, wheezing and stridor.   Cardiovascular: Negative for chest pain, palpitations and leg swelling.  Gastrointestinal: Negative for abdominal distention.  Genitourinary: Negative for dysuria, urgency, frequency, hematuria, flank pain, decreased urine volume, difficulty urinating and dyspareunia.  Musculoskeletal: Negative for back pain, joint swelling, arthralgias and gait problem.  Neurological: Negative for dizziness, tremors, seizures, syncope, facial asymmetry, speech difficulty, weakness, light-headedness, numbness and headaches.  Hematological: Negative for adenopathy. Does not bruise/bleed easily.  Psychiatric/Behavioral: Negative for hallucinations, behavioral problems, confusion, dysphoric mood, decreased concentration and agitation.    Objective:   Filed Vitals:   12/16/12 0958  BP: 98/66  Pulse: 81  Temp: 97.7 F (36.5 C)  Resp: 16    Physical Exam  Constitutional: Appears well-developed and well-nourished. No distress.  HENT: Normocephalic. External right and left ear normal. Oropharynx is clear and moist.  Eyes: Conjunctivae and EOM are normal. PERRLA, no scleral icterus.  Neck: Normal ROM. Neck supple. No JVD. No tracheal deviation. No  thyromegaly.  CVS: RRR, S1/S2 +, no murmurs, no gallops, no carotid bruit.  Pulmonary: Effort and breath sounds normal, no stridor, rhonchi, wheezes, rales.  Abdominal: Soft. BS +,  no distension, tenderness, rebound or guarding.  Musculoskeletal: Normal range of motion. No edema and no tenderness.  Lymphadenopathy: No lymphadenopathy noted, cervical, inguinal. Neuro: Alert. Normal reflexes, muscle tone coordination. No cranial nerve deficit. Skin: Skin is  warm and dry. No rash noted. Not diaphoretic. No erythema. No pallor.  Psychiatric: Normal mood and affect. Behavior, judgment, thought content normal.   Lab Results  Component Value Date   WBC 8.4 12/09/2012   HGB 8.9* 12/09/2012   HCT 30.7* 12/09/2012   MCV 64.5* 12/09/2012   PLT 296 12/09/2012   Lab Results  Component Value Date   CREATININE 1.04 12/07/2012   BUN 15 12/07/2012   NA 135 12/07/2012   K 4.0 12/07/2012   CL 103 12/07/2012   CO2 24 12/07/2012    No results found for this basename: HGBA1C   Lipid Panel  No results found for this basename: chol, trig, hdl, cholhdl, vldl, ldlcalc      Assessment and plan:   Patient Active Problem List   Diagnosis Date Noted  . DVT (deep venous thrombosis) 12/06/2012  . Pulmonary embolism 12/05/2012  . Anemia 12/05/2012  . Right ankle sprain 12/05/2012   Recheck PT/INR today and check stat Continue current dose of warfarin pending results of today's test Continue 15 mg warfarin daily for now.  Continue close monitoring of PT/INR Acetaminophen for headaches recommended   Follow up after PT/INR results known  Addendum:  INR 3.0 - continue current dose and follow up for repeat PT in 2 days - instructions given  Rodney Langton, MD, CDE, FAAFP [facility] [place], [place]

## 2012-12-16 NOTE — Patient Instructions (Addendum)
Anemia, Frequently Asked Questions WHAT ARE THE SYMPTOMS OF ANEMIA?  Headache.  Difficulty thinking.  Fatigue.  Shortness of breath.  Weakness.  Rapid heartbeat. AT WHAT POINT ARE PEOPLE CONSIDERED ANEMIC?  This varies with gender and age.   Both hemoglobin (Hgb) and hematocrit values are used to define anemia. These lab values are obtained from a complete blood count (CBC) test. This is performed at a caregiver's office.  The normal range of hemoglobin values for adult men is 14.0 g/dL to 17.4 g/dL. For nonpregnant women, values are 12.3 g/dL to 15.3 g/dL.  The World Health Organization defines anemia as less than 12 g/dL for nonpregnant women and less than 13 g/dL for men.  For adult males, the average normal hematocrit is 46%, and the range is 40% to 52%.  For adult females, the average normal hematocrit is 41%, and the range is 35% to 47%.  Values that fall below the lower limits can be a sign of anemia and should have further checking (evaluation). GROUPS OF PEOPLE WHO ARE AT RISK FOR DEVELOPING ANEMIA INCLUDE:   Infants who are breastfed or taking a formula that is not fortified with iron.  Children going through a rapid growth spurt. The iron available can not keep up with the needs for a red cell mass which must grow with the child.  Women in childbearing years. They need iron because of blood loss during menstruation.  Pregnant women. The growing fetus creates a high demand for iron.  People with ongoing gastrointestinal blood loss are at risk of developing iron deficiency.  Individuals with leukemia or cancer who must receive chemotherapy or radiation to treat their disease. The drugs or radiation used to treat these diseases often decreases the bone marrow's ability to make cells of all classes. This includes red blood cells, white blood cells, and platelets.  Individuals with chronic inflammatory conditions such as rheumatoid arthritis or chronic  infections.  The elderly. ARE SOME TYPES OF ANEMIA INHERITED?   Yes, some types of anemia are due to inherited or genetic defects.  Sickle cell anemia. This occurs most often in people of African, African American, and Mediterranean descent.  Thalassemia (or Cooley's anemia). This type is found in people of Mediterranean and Southeast Asian descent. These types of anemia are common.  [place]. This is rare. CAN CERTAIN MEDICATIONS CAUSE A PERSON TO BECOME ANEMIC?  Yes. For example, drugs to fight cancer (chemotherapeutic agents) often cause anemia. These drugs can slow the bone marrow's ability to make red blood cells. If there are not enough red blood cells, the body does not get enough oxygen. WHAT HEMATOCRIT LEVEL IS REQUIRED TO DONATE BLOOD?  The lower limit of an acceptable hematocrit for blood donors is 38%. If you have a low hematocrit value, you should schedule an appointment with your caregiver. ARE BLOOD TRANSFUSIONS COMMONLY USED TO CORRECT ANEMIA, AND ARE THEY DANGEROUS?  They are used to treat anemia as a last resort. Your caregiver will find the cause of the anemia and correct it if possible. Most blood transfusions are given because of excessive bleeding at the time of surgery, with trauma, or because of bone marrow suppression in patients with cancer or leukemia on chemotherapy. Blood transfusions are safer than ever before. We also know that blood transfusions affect the immune system and may increase certain risks. There is also a concern for human error. In 1/16,000 transfusions, a patient receives a transfusion of blood that is not matched with his or her   blood type.  WHAT IS IRON DEFICIENCY ANEMIA AND CAN I CORRECT IT BY CHANGING MY DIET?  Iron is an essential part of hemoglobin. Without enough hemoglobin, anemia develops and the body does not get the right amount of oxygen. Iron deficiency anemia develops after the body has had a low level of iron for a long time. This is  either caused by blood loss, not taking in or absorbing enough iron, or increased demands for iron (like pregnancy or rapid growth).  Foods from animal origin such as beef, chicken, and pork, are good sources of iron. Be sure to have one of these foods at each meal. Vitamin C helps your body absorb iron. Foods rich in Vitamin C include citrus, bell pepper, strawberries, spinach and cantaloupe. In some cases, iron supplements may be needed in order to correct the iron deficiency. In the case of poor absorption, extra iron may have to be given directly into the vein through a needle (intravenously). I HAVE BEEN DIAGNOSED WITH IRON DEFICIENCY ANEMIA AND MY CAREGIVER PRESCRIBED IRON SUPPLEMENTS. HOW LONG WILL IT TAKE FOR MY BLOOD TO BECOME NORMAL?  It depends on the degree of anemia at the beginning of treatment. Most people with mild to moderate iron deficiency, anemia will correct the anemia over a period of 2 to 3 months. But after the anemia is corrected, the iron stored by the body is still low. Caregivers often suggest an additional 6 months of oral iron therapy once the anemia has been reversed. This will help prevent the iron deficiency anemia from quickly happening again. Non-anemic adult males should take iron supplements only under the direction of a doctor, too much iron can cause liver damage.  MY HEMOGLOBIN IS 9 G/DL AND I AM SCHEDULED FOR SURGERY. SHOULD I POSTPONE THE SURGERY?  If you have Hgb of 9, you should discuss this with your caregiver right away. Many patients with similar hemoglobin levels have had surgery without problems. If minimal blood loss is expected for a minor procedure, no treatment may be necessary.  If a greater blood loss is expected for more extensive procedures, you should ask your caregiver about being treated with erythropoietin and iron. This is to accelerate the recovery of your hemoglobin to a normal level before surgery. An anemic patient who undergoes high-blood-loss  surgery has a greater risk of surgical complications and need for a blood transfusion, which also carries some risk.  I HAVE BEEN TOLD THAT HEAVY MENSTRUAL PERIODS CAUSE ANEMIA. IS THERE ANYTHING I CAN DO TO PREVENT THE ANEMIA?  Anemia that results from heavy periods is usually due to iron deficiency. You can try to meet the increased demands for iron caused by the heavy monthly blood loss by increasing the intake of iron-rich foods. Iron supplements may be required. Discuss your concerns with your caregiver. WHAT CAUSES ANEMIA DURING PREGNANCY?  Pregnancy places major demands on the body. The mother must meet the needs of both her body and her growing baby. The body needs enough iron and folate to make the right amount of red blood cells. To prevent anemia while pregnant, the mother should stay in close contact with her caregiver.  Be sure to eat a diet that has foods rich in iron and folate like liver and dark green leafy vegetables. Folate plays an important role in the normal development of a baby's spinal cord. Folate can help prevent serious disorders like spina bifida. If your diet does not provide adequate nutrients, you may want to talk   with your caregiver about nutritional supplements.  WHAT IS THE RELATIONSHIP BETWEEN FIBROID TUMORS AND ANEMIA IN WOMEN?  The relationship is usually caused by the increased menstrual blood loss caused by fibroids. Good iron intake may be required to prevent iron deficiency anemia from developing.  Document Released: 02/09/2004 Document Revised: 09/25/2011 Document Reviewed: 07/26/2010 [facility] Patient Information 2014 [place], [place]. Anticoagulation, Generic Anticoagulants are medications used to prevent clots from developing in your veins. These medications are also known as blood thinners. If blood clots are untreated, they could travel to your lungs. This is called a pulmonary embolus. A blood clot in your lungs can be fatal.  Caregivers often use  anticoagulants to prevent clots following surgery. Anticoagulants are also used along with aspirin when the heart is not getting enough blood. Another anticoagulant called warfarin is started 2 to 3 days after a rapid-acting injectable anticoagulant is started. The rapid-acting anticoagulants are usually continued until warfarin has begun to work. Your caregiver will judge this length of time by blood tests known as the prothrombin time (PT) and International Normalization Ratio (INR). This means that your blood is at the necessary and best level to prevent clots. RISKS AND COMPLICATIONS  If you have received recent epidural anesthesia, spinal anesthesia, or a spinal tap while receiving anticoagulants, you are at risk for developing a blood clot in or around the spine. This condition could result in long-term or permanent paralysis.  Because anticoagulants thin your blood, severe bleeding may occur from any tissue or organ. Symptoms of the blood being too thin may include:  Bleeding from the nose or gums that does not stop quickly.  Unusual bruising or bruising easily.  Swelling or pain at an injection site.  A cut that does not stop bleeding within 10 minutes.  Continual nausea for more than 1 day or vomiting blood.  Coughing up blood.  Blood in the urine which may appear as pink, red, or brown urine.  Blood in bowel movements which may appear as red, dark or black stools.  Sudden weakness or numbness of the face, arm, or leg, especially on one side of the body.  Sudden confusion.  Trouble speaking (aphasia) or understanding.  Sudden trouble seeing in one or both eyes.  Sudden trouble walking.  Dizziness.  Loss of balance or coordination.  Severe pain, such as a headache, joint pain, or back pain.  Fever.  Too little anticoagulation continues to allow the risk for blood clots. HOME CARE INSTRUCTIONS   Due to the complications of anticoagulants, it is very important that  you take your anticoagulant as directed by your caregiver. Anticoagulants need to be taken exactly as instructed. Be sure you understand all your anticoagulant instructions.  Warfarin. Your caregiver will advise you on the length of treatment (usually 3 6 months, sometimes lifelong).  Take warfarin exactly as directed by your caregiver. It is recommended that you take your warfarin dose at the same time of the day. It is preferred that you take warfarin in the late afternoon. If you have been told to stop taking warfarin, do not resume taking warfarin until directed to do so by your caregiver. Follow your caregiver's instructions if you accidentally take an extra dose or miss a dose of warfarin. It is very important to take warfarin as directed since bleeding or blood clots could result in chronic or permanent injury, pain, or disability.  Too much and too little warfarin are both dangerous. Too much warfarin increases the risk of bleeding.  Too little warfarin continues to allow the risk for blood clots. While taking warfarin, you will need to have regular blood tests to measure your blood clotting time. These blood tests usually include both the PT and INR tests. The PT and INR results allow your caregiver to adjust your dose of warfarin. The dose can change for many reasons. It is critically important that you take warfarin exactly as prescribed, and that you have your PT and INR levels drawn exactly as directed. Follow up with your laboratory test appointments as directed. It is very important to keep your lab appointments. Not keeping lab appointments could result in a chronic or permanent injury, pain, or disability.  Many foods, especially foods high in vitamin K can interfere with warfarin and affect the PT and INR results. Foods high in vitamin K include spinach, kale, broccoli, cabbage, collard and turnip greens, brussels sprouts, peas, cauliflower, seaweed, and parsley as well as beef and pork  liver, green tea, and soybean oil. You should eat a consistent amount of foods high in vitamin K. Avoid major changes in your diet, or notify your caregiver before changing your diet. Arrange a visit with a dietitian to answer your questions.  Many medicines can interfere with warfarin and affect the PT and INR results. You must tell your caregiver about any and all medicines you take, this includes all vitamins and supplements. Ask your caregiver before taking these. Prescription and over-the-counter medicine consistency is critical to warfarin management. It is important that potential interactions are checked before you start a new medicine. Be especially cautious with aspirin and anti-inflammatory medicines. Ask your caregiver before taking these. Medicines such as antibiotics and acid-reducing medicine can interact with warfarin and can cause an increased warfarin effect. Warfarin can also interfere with the effectiveness of medicines you are taking. Do not take or discontinue any prescribed or over-the-counter medicine except on the advice of your caregiver or pharmacist.  Some vitamins, supplements, and herbal products interfere with the effectiveness of warfarin. Vitamin E may increase the anticoagulant effects of warfarin. Vitamin K may can cause warfarin to be less effective. Do not take or discontinue any vitamin, supplement, or herbal product except on the advice of your caregiver or pharmacist.  Alcohol can change the body's ability to handle warfarin. It is best to avoid alcoholic drinks or consume only very small amounts while taking warfarin. Notify your caregiver if you change your alcohol intake. A sudden increase in alcohol use can increase your risk of bleeding. Chronic alcohol use can cause warfarin to be less effective.  If you have a loss of appetite or get the stomach flu (viral gastroenteritis), talk to your caregiver as soon as possible. A decrease in your normal vitamin K intake  can make you more sensitive to your usual dose of warfarin.  Some medical conditions may increase your risk for bleeding while you are taking warfarin. A fever, diarrhea lasting more than a day, worsening heart failure, or worsening liver function are some medical conditions that could affect warfarin. Contact your caregiver if you have any of these medical conditions.  Warfarin can have side effects, such as excessive bruising or bleeding. You will need to hold pressure over cuts for longer than usual.  Be careful not to cut yourself when using sharp objects.  Notify your dentist or other caregivers before procedures.  Limit physical activities or sports that could result in a fall or cause injury. Avoid contact sports.  Wear a medical alert  bracelet or carry a medical alert card. SEEK MEDICAL CARE IF:   You develop any rashes.  You have any worsening of the condition for which you are receiving anticoagulation therapy. SEEK IMMEDIATE MEDICAL CARE IF:   Bleeding from the nose or gums does not stop quickly.  You have unusual bruising or are bruising easily.  Swelling or pain occurs at an injection site.  A cut does not stop bleeding within 10 minutes.  You have continual nausea for more than 1 day or are vomiting blood.  You are coughing up blood.  You have blood in the urine.  You have dark or black stools.  You have sudden weakness or numbness of the face, arm, or leg, especially on one side of the body.  You have sudden confusion.  You have trouble speaking (aphasia) or understanding.  You have sudden trouble seeing in one or both eyes.  You have sudden trouble walking.  You have dizziness.  You have a loss of balance or coordination.  You have severe pain, such as a headache, joint pain, or back pain.  You have a serious fall or head injury, even if you are not bleeding.  You have an oral temperature above 102 F (38.9 C), not controlled by medicine. ANY OF  THESE SYMPTOMS MAY REPRESENT A SERIOUS PROBLEM THAT IS AN EMERGENCY. Do not wait to see if the symptoms will go away. Get medical help right away. Call your local emergency services (911 in [place] DO NOT drive yourself to the hospital. MAKE SURE YOU:   Understand these instructions.  Will watch your condition.  Will get help right away if you are not doing well or get worse. Document Released: 07/03/2005 Document Revised: 03/27/2012 Document Reviewed: 02/05/2008 [facility] Patient Information 2014 [place], [place]. Headaches, Frequently Asked Questions MIGRAINE HEADACHES Q: What is migraine? What causes it? How can I treat it? A: Generally, migraine headaches begin as a dull ache. Then they develop into a constant, throbbing, and pulsating pain. You may experience pain at the temples. You may experience pain at the front or back of one or both sides of the head. The pain is usually accompanied by a combination of:  Nausea.  Vomiting.  Sensitivity to light and noise. Some people (about 15%) experience an aura (see below) before an attack. The cause of migraine is believed to be chemical reactions in the brain. Treatment for migraine may include over-the-counter or prescription medications. It may also include self-help techniques. These include relaxation training and biofeedback.  Q: What is an aura? A: About 15% of people with migraine get an "aura". This is a sign of neurological symptoms that occur before a migraine headache. You may see wavy or jagged lines, dots, or flashing lights. You might experience tunnel vision or blind spots in one or both eyes. The aura can include visual or auditory hallucinations (something imagined). It may include disruptions in smell (such as strange odors), taste or touch. Other symptoms include:  Numbness.  A "pins and needles" sensation.  Difficulty in recalling or speaking the correct word. These neurological events may last as long as 60 minutes.  These symptoms will fade as the headache begins. Q: What is a trigger? A: Certain physical or environmental factors can lead to or "trigger" a migraine. These include:  Foods.  Hormonal changes.  Weather.  Stress. It is important to remember that triggers are different for everyone. To help prevent migraine attacks, you need to figure out which triggers affect  you. Keep a headache diary. This is a good way to track triggers. The diary will help you talk to your healthcare professional about your condition. Q: Does weather affect migraines? A: Bright sunshine, hot, humid conditions, and drastic changes in barometric pressure may lead to, or "trigger," a migraine attack in some people. But studies have shown that weather does not act as a trigger for everyone with migraines. Q: What is the link between migraine and hormones? A: Hormones start and regulate many of your body's functions. Hormones keep your body in balance within a constantly changing environment. The levels of hormones in your body are unbalanced at times. Examples are during menstruation, pregnancy, or menopause. That can lead to a migraine attack. In fact, about three quarters of all women with migraine report that their attacks are related to the menstrual cycle.  Q: Is there an increased risk of stroke for migraine sufferers? A: The likelihood of a migraine attack causing a stroke is very remote. That is not to say that migraine sufferers cannot have a stroke associated with their migraines. In persons under age 63, the most common associated factor for stroke is migraine headache. But over the course of a person's normal life span, the occurrence of migraine headache may actually be associated with a reduced risk of dying from cerebrovascular disease due to stroke.  Q: What are acute medications for migraine? A: Acute medications are used to treat the pain of the headache after it has started. Examples over-the-counter  medications, NSAIDs, ergots, and triptans.  Q: What are the triptans? A: Triptans are the newest class of abortive medications. They are specifically targeted to treat migraine. Triptans are vasoconstrictors. They moderate some chemical reactions in the brain. The triptans work on receptors in your brain. Triptans help to restore the balance of a neurotransmitter called serotonin. Fluctuations in levels of serotonin are thought to be a main cause of migraine.  Q: Are over-the-counter medications for migraine effective? A: Over-the-counter, or "OTC," medications may be effective in relieving mild to moderate pain and associated symptoms of migraine. But you should see your caregiver before beginning any treatment regimen for migraine.  Q: What are preventive medications for migraine? A: Preventive medications for migraine are sometimes referred to as "prophylactic" treatments. They are used to reduce the frequency, severity, and length of migraine attacks. Examples of preventive medications include antiepileptic medications, antidepressants, beta-blockers, calcium channel blockers, and NSAIDs (nonsteroidal anti-inflammatory drugs). Q: Why are anticonvulsants used to treat migraine? A: During the past few years, there has been an increased interest in antiepileptic drugs for the prevention of migraine. They are sometimes referred to as "anticonvulsants". Both epilepsy and migraine may be caused by similar reactions in the brain.  Q: Why are antidepressants used to treat migraine? A: Antidepressants are typically used to treat people with depression. They may reduce migraine frequency by regulating chemical levels, such as serotonin, in the brain.  Q: What alternative therapies are used to treat migraine? A: The term "alternative therapies" is often used to describe treatments considered outside the scope of conventional Western medicine. Examples of alternative therapy include acupuncture, acupressure, and  yoga. Another common alternative treatment is herbal therapy. Some herbs are believed to relieve headache pain. Always discuss alternative therapies with your caregiver before proceeding. Some herbal products contain arsenic and other toxins. TENSION HEADACHES Q: What is a tension-type headache? What causes it? How can I treat it? A: Tension-type headaches occur randomly. They are often the result of  temporary stress, anxiety, fatigue, or anger. Symptoms include soreness in your temples, a tightening band-like sensation around your head (a "vice-like" ache). Symptoms can also include a pulling feeling, pressure sensations, and contracting head and neck muscles. The headache begins in your forehead, temples, or the back of your head and neck. Treatment for tension-type headache may include over-the-counter or prescription medications. Treatment may also include self-help techniques such as relaxation training and biofeedback. CLUSTER HEADACHES Q: What is a cluster headache? What causes it? How can I treat it? A: Cluster headache gets its name because the attacks come in groups. The pain arrives with little, if any, warning. It is usually on one side of the head. A tearing or bloodshot eye and a runny nose on the same side of the headache may also accompany the pain. Cluster headaches are believed to be caused by chemical reactions in the brain. They have been described as the most severe and intense of any headache type. Treatment for cluster headache includes prescription medication and oxygen. SINUS HEADACHES Q: What is a sinus headache? What causes it? How can I treat it? A: When a cavity in the bones of the face and skull (a sinus) becomes inflamed, the inflammation will cause localized pain. This condition is usually the result of an allergic reaction, a tumor, or an infection. If your headache is caused by a sinus blockage, such as an infection, you will probably have a fever. An x-ray will confirm a  sinus blockage. Your caregiver's treatment might include antibiotics for the infection, as well as antihistamines or decongestants.  REBOUND HEADACHES Q: What is a rebound headache? What causes it? How can I treat it? A: A pattern of taking acute headache medications too often can lead to a condition known as "rebound headache." A pattern of taking too much headache medication includes taking it more than 2 days per week or in excessive amounts. That means more than the label or a caregiver advises. With rebound headaches, your medications not only stop relieving pain, they actually begin to cause headaches. Doctors treat rebound headache by tapering the medication that is being overused. Sometimes your caregiver will gradually substitute a different type of treatment or medication. Stopping may be a challenge. Regularly overusing a medication increases the potential for serious side effects. Consult a caregiver if you regularly use headache medications more than 2 days per week or more than the label advises. ADDITIONAL QUESTIONS AND ANSWERS Q: What is biofeedback? A: Biofeedback is a self-help treatment. Biofeedback uses special equipment to monitor your body's involuntary physical responses. Biofeedback monitors:  Breathing.  Pulse.  Heart rate.  Temperature.  Muscle tension.  Brain activity. Biofeedback helps you refine and perfect your relaxation exercises. You learn to control the physical responses that are related to stress. Once the technique has been mastered, you do not need the equipment any more. Q: Are headaches hereditary? A: Four out of five (80%) of people that suffer report a family history of migraine. Scientists are not sure if this is genetic or a family predisposition. Despite the uncertainty, a child has a 50% chance of having migraine if one parent suffers. The child has a 75% chance if both parents suffer.  Q: Can children get headaches? A: By the time they reach high  school, most young people have experienced some type of headache. Many safe and effective approaches or medications can prevent a headache from occurring or stop it after it has begun.  Q: What type of  doctor should I see to diagnose and treat my headache? A: Start with your primary caregiver. Discuss his or her experience and approach to headaches. Discuss methods of classification, diagnosis, and treatment. Your caregiver may decide to recommend you to a headache specialist, depending upon your symptoms or other physical conditions. Having diabetes, allergies, etc., may require a more comprehensive and inclusive approach to your headache. The National Headache Foundation will provide, upon request, a list of [facility] physician members in your state. Document Released: 09/23/2003 Document Revised: 09/25/2011 Document Reviewed: 03/02/2008 [facility] Patient Information 2014 [place], [place]. Headaches, Analgesic Rebound Analgesic agents are prescription or over-the-counter medications used to control pain, including headaches. However, overuse or misuse of theses medications can lead to rebound headaches. Rebound headaches are headaches that recur after the analgesic medication wears off. Eventually, the rebound headaches can become longlasting (chronic). If this happens, you must completely stop using analgesic medications. If not, the chronic headache is likely to continue despite the use of any other treatment. Usually when you stop taking analgesic medications, the headache may initally get worse for several days. Along with this you may experience sickness in your stomach (nausea), and you may throw up (vomit). After a period of 3 to 5 days, these symptoms begin to improve. Sometimes improvement may take longer. Eventually, the headaches will slowly improve with treatment with the right medications. Most people are able to stop using analgesic medications at home with a caregiver's supervision. But some find it  difficult and may require hospitalization. Document Released: 09/23/2003 Document Revised: 09/25/2011 Document Reviewed: 02/20/2008 [facility] Patient Information 2014 [place], [place].

## 2012-12-18 ENCOUNTER — Emergency Department (HOSPITAL_COMMUNITY)
Admission: EM | Admit: 2012-12-18 | Discharge: 2012-12-18 | Disposition: A | Discharge status: Home / Self Care | Payer: Self-pay | Attending: Emergency Medicine | Admitting: Emergency Medicine

## 2012-12-18 ENCOUNTER — Telehealth: Payer: Self-pay | Admitting: *Deleted

## 2012-12-18 ENCOUNTER — Ambulatory Visit: Payer: Self-pay | Attending: Family Medicine

## 2012-12-18 ENCOUNTER — Other Ambulatory Visit: Payer: Self-pay | Admitting: Family Medicine

## 2012-12-18 ENCOUNTER — Encounter (HOSPITAL_COMMUNITY): Payer: Self-pay | Admitting: Emergency Medicine

## 2012-12-18 DIAGNOSIS — Z86718 Personal history of other venous thrombosis and embolism: Secondary | ICD-10-CM | POA: Insufficient documentation

## 2012-12-18 DIAGNOSIS — Z8709 Personal history of other diseases of the respiratory system: Secondary | ICD-10-CM | POA: Insufficient documentation

## 2012-12-18 DIAGNOSIS — Z7901 Long term (current) use of anticoagulants: Secondary | ICD-10-CM | POA: Insufficient documentation

## 2012-12-18 DIAGNOSIS — Z86711 Personal history of pulmonary embolism: Secondary | ICD-10-CM | POA: Insufficient documentation

## 2012-12-18 DIAGNOSIS — G43909 Migraine, unspecified, not intractable, without status migrainosus: Secondary | ICD-10-CM | POA: Insufficient documentation

## 2012-12-18 DIAGNOSIS — Z09 Encounter for follow-up examination after completed treatment for conditions other than malignant neoplasm: Secondary | ICD-10-CM | POA: Insufficient documentation

## 2012-12-18 LAB — CBC WITH DIFFERENTIAL/PLATELET
Eosinophils Absolute: 0.1 10*3/uL (ref 0.0–0.7)
Eosinophils Relative: 2 % (ref 0–5)
Hemoglobin: 11.1 g/dL — ABNORMAL LOW (ref 12.0–15.0)
Lymphocytes Relative: 49 % — ABNORMAL HIGH (ref 12–46)
Lymphs Abs: 2.7 10*3/uL (ref 0.7–4.0)
MCH: 20.9 pg — ABNORMAL LOW (ref 26.0–34.0)
MCV: 67.1 fL — ABNORMAL LOW (ref 78.0–100.0)
Monocytes Relative: 11 % (ref 3–12)
Neutrophils Relative %: 38 % — ABNORMAL LOW (ref 43–77)
RBC: 5.32 MIL/uL — ABNORMAL HIGH (ref 3.87–5.11)
WBC: 5.5 10*3/uL (ref 4.0–10.5)

## 2012-12-18 MED ORDER — SODIUM CHLORIDE 0.9 % IV SOLN
INTRAVENOUS | Status: DC
  Administered 2012-12-18: 18:00:00 via INTRAVENOUS

## 2012-12-18 MED ORDER — WARFARIN SODIUM 7.5 MG PO TABS: ORAL_TABLET | ORAL | Status: DC

## 2012-12-18 MED ORDER — ISOMETHEPTENE-APAP-DICHLORAL 65-325-100 MG PO CAPS: 1.0000 | ORAL_CAPSULE | Freq: Four times a day (QID) | ORAL | Status: DC | PRN

## 2012-12-18 MED ORDER — METOCLOPRAMIDE HCL 5 MG/ML IJ SOLN
10.0000 mg | Freq: Once | INTRAMUSCULAR | Status: AC
Start: 2012-12-18 — End: 2012-12-18
  Administered 2012-12-18: 10 mg via INTRAVENOUS
  Filled 2012-12-18: qty 2

## 2012-12-18 MED ORDER — KETOROLAC TROMETHAMINE 30 MG/ML IJ SOLN
30.0000 mg | Freq: Once | INTRAMUSCULAR | Status: AC
  Administered 2012-12-18: 30 mg via INTRAVENOUS
  Filled 2012-12-18: qty 1

## 2012-12-18 MED ORDER — DIPHENHYDRAMINE HCL 50 MG/ML IJ SOLN
25.0000 mg | Freq: Once | INTRAMUSCULAR | Status: AC
  Administered 2012-12-18: 25 mg via INTRAVENOUS
  Filled 2012-12-18: qty 1

## 2012-12-18 NOTE — ED Provider Notes (Signed)
History    CSN: [id number] Arrival date & time 12/18/12  1722 First MD Initiated Contact with Patient 12/18/12 1730      Chief Complaint  Patient presents with  . Headache   HPI Patient presents to the emergency room with complaints of a headache for the last 5 days. Patient states she was recently in the hospital and diagnosed with a DVT and PE. She's not having any trouble with chest pain, shortness of breath or increased leg swelling. She had an outpatient visit and apparently her INR was 3.9. Patient was told in her discharge instructions that she had any worsening symptoms including headache that she should go to the emergency room to be evaluated. The patient does have a history of recurrent headaches usually during her menstrual cycle. Her menstrual cycle just finished several days ago but she continues to have a headache which is not her typical pattern she was not sure what else she could take but has been taking hydrocodone that she was prescribed for her chest pain. She denies fevers or neck pain. She denies any numbness or weakness. She denies any head injury. Headache onset was gradual and located behind her right eye. She does not have any neck pain or stiffness. Past Medical History  Diagnosis Date  . Lung collapse   . Pulmonary embolism, blood-clot, obstetric     Past Surgical History  Procedure Laterality Date  . Rib resection      Family History  Problem Relation Age of Onset  . COPD Father     History  Substance Use Topics  . Smoking status: Never Smoker   . Smokeless tobacco: Never Used  . Alcohol Use: Yes    OB History   Grav Para Term Preterm Abortions TAB SAB Ect Mult Living                  Review of Systems  All other systems reviewed and are negative.    Allergies  Review of patient's allergies indicates no known allergies.  Home Medications   Current Outpatient Rx  Name  Route  Sig  Dispense  Refill  . ferrous sulfate 325 (65 FE) MG  tablet   Oral   Take 1 tablet (325 mg total) by mouth 3 (three) times daily with meals.   90 tablet   3   . HYDROcodone-acetaminophen (NORCO/VICODIN) 5-325 MG per tablet   Oral   Take 1 tablet by mouth every 4 (four) hours as needed for pain.         Marland Kitchen warfarin (COUMADIN) 7.5 MG tablet   Oral   Take 15 mg by mouth daily.         Marland Kitchen isometheptene-acetaminophen-dichloralphenazone (MIDRIN) 65-325-100 MG capsule   Oral   Take 1 capsule by mouth 4 (four) times daily as needed.   30 capsule   0     BP 102/72  Pulse 96  Temp(Src) 98.3 F (36.8 C) (Oral)  Resp 18  SpO2 100%  LMP 12/13/2012  Physical Exam  Nursing note and vitals reviewed. Constitutional: She appears well-developed and well-nourished. No distress.  HENT:  Head: Normocephalic and atraumatic.  Right Ear: External ear normal.  Left Ear: External ear normal.  Eyes: Conjunctivae are normal. Right eye exhibits no discharge. Left eye exhibits no discharge. No scleral icterus.  Neck: Neck supple. No tracheal deviation present.  Cardiovascular: Normal rate, regular rhythm and intact distal pulses.   Pulmonary/Chest: Effort normal and breath sounds normal. No stridor. No  respiratory distress. She has no wheezes. She has no rales.  Abdominal: Soft. Bowel sounds are normal. She exhibits no distension. There is no tenderness. There is no rebound and no guarding.  Musculoskeletal: She exhibits no edema and no tenderness.  Neurological: She is alert. She has normal strength. No sensory deficit. Cranial nerve deficit:  no gross defecits noted. She exhibits normal muscle tone. She displays no seizure activity. Coordination normal.  Skin: Skin is warm and dry. No rash noted.  Psychiatric: She has a normal mood and affect.    ED Course  Procedures (including critical care time) Medications  0.9 %  sodium chloride infusion ( Intravenous New Bag/Given 12/18/12 1817)  metoCLOPramide (REGLAN) injection 10 mg (10 mg Intravenous  Given 12/18/12 1817)  diphenhydrAMINE (BENADRYL) injection 25 mg (25 mg Intravenous Given 12/18/12 1818)  ketorolac (TORADOL) 30 MG/ML injection 30 mg (30 mg Intravenous Given 12/18/12 1818)    Labs Reviewed  CBC WITH DIFFERENTIAL - Abnormal; Notable for the following:    RBC 5.32 (*)    Hemoglobin 11.1 (*)    HCT 35.7 (*)    MCV 67.1 (*)    MCH 20.9 (*)    RDW 22.9 (*)    Platelets 449 (*)    Neutrophils Relative % 38 (*)    Lymphocytes Relative 49 (*)    All other components within normal limits   No results found.   1. Migraine headache     MDM    patient presents to the emergency room with headache. Her symptoms are suggestive of a migraine headache. She does often get them associated with her menstrual periods. Patient has not had any recent injuries. She did have her INR checked today and it was not significantly elevated. Her CBC is unremarkable.    The patient improved after treatment in the emergency department. I will discharge her home with a prescription for Midrin that she can take as needed in the interim       Celene Kras, MD 12/18/12 1956

## 2012-12-18 NOTE — ED Notes (Signed)
Pt reports a 10/10 anterior headache that started six days ago, which got better two days ago, however it returned yesterday and was worse. Pt was recently discharged from an admission in which she was diagnosed with DVT and PE. Pt denies shortness of breath.

## 2012-12-18 NOTE — Telephone Encounter (Signed)
12/18/12 Spoke with patient made aware INR level 3.97 today patient  Instructed to hold coumadin today and tomorrow do not take per DR. Myers Appoint ment schedule for 12/20/12 for PT/INR  Check. P.Shlomie Romig,RN BSN [facility]

## 2012-12-20 ENCOUNTER — Telehealth: Payer: Self-pay | Admitting: *Deleted

## 2012-12-20 ENCOUNTER — Other Ambulatory Visit: Payer: Self-pay | Admitting: Family Medicine

## 2012-12-20 ENCOUNTER — Telehealth: Payer: Self-pay | Admitting: Family Medicine

## 2012-12-20 ENCOUNTER — Ambulatory Visit: Payer: Self-pay | Attending: Family Medicine

## 2012-12-20 DIAGNOSIS — I2699 Other pulmonary embolism without acute cor pulmonale: Secondary | ICD-10-CM

## 2012-12-20 LAB — PROTIME-INR: Prothrombin Time: 18.8 seconds — ABNORMAL HIGH (ref 11.6–15.2)

## 2012-12-20 MED ORDER — WARFARIN SODIUM 10 MG PO TABS: 10.0000 mg | ORAL_TABLET | Freq: Every day | ORAL | Status: DC

## 2012-12-20 MED ORDER — WARFARIN SODIUM 3 MG PO TABS: 3.0000 mg | ORAL_TABLET | Freq: Every day | ORAL | Status: DC

## 2012-12-20 NOTE — Progress Notes (Signed)
Quick Note:  Please inform patient that her INR was 1.68. Recommend that she now take 13 mg po daily. She will need to take a 10 mg and a 3 mg once daily at 6pm. Will call in prescription to pharmacy.    Rodney Langton, MD, CDE, FAAFP [facility] [place], [place]   ______

## 2012-12-20 NOTE — Telephone Encounter (Signed)
Pt confused about how to take Coumadin.  Please call back with instructions.

## 2012-12-20 NOTE — Telephone Encounter (Signed)
12/20/12 Appointment schedule for Tuesday 12/24/12 For PT/INR check Donna [name]

## 2012-12-20 NOTE — Telephone Encounter (Signed)
12/20/12 Patient made aware of INR 1.68 today per Dr. Laural Benes recommend she takes Coumadin 13mg  po daily. She will need to take 10mg  and a 3mg  once daily at 6 PM. Dr. Laural Benes called in prescription. To [facility] on [address].  P.[name],RN

## 2012-12-20 NOTE — Telephone Encounter (Signed)
12/20/12 Spoke with patient regarding Coumadin. Patient verbalize and understands all instructions  Regarding coumadin. Appointment schedule for Tuesday. P.Kortlynn Poust,RN

## 2012-12-20 NOTE — Telephone Encounter (Signed)
VM from 12/18/12, Okey Regal from pharmacy has question about script. If already addressed please close encounter.

## 2012-12-24 ENCOUNTER — Telehealth: Payer: Self-pay | Admitting: Family Medicine

## 2012-12-24 ENCOUNTER — Ambulatory Visit: Payer: Self-pay | Attending: Family Medicine

## 2012-12-24 DIAGNOSIS — I2699 Other pulmonary embolism without acute cor pulmonale: Secondary | ICD-10-CM

## 2012-12-24 LAB — PROTIME-INR
INR: 2.23 — ABNORMAL HIGH (ref <1.50)
Prothrombin Time: 23.2 seconds — ABNORMAL HIGH (ref 11.6–15.2)

## 2012-12-24 NOTE — Telephone Encounter (Signed)
1. Calling about PT-INR results.   2. What pain medication can be taken with Coumadin, and won't affect blood thinner?

## 2012-12-24 NOTE — Progress Notes (Signed)
Quick Note:  Please notify patient that her PT/INR is therapeutic. Continue current dose of warfarin and recheck PT/INR on Monday 6/16.   Rodney Langton, MD, CDE, FAAFP [facility] [place], [place]   ______

## 2012-12-25 ENCOUNTER — Telehealth: Payer: Self-pay | Admitting: *Deleted

## 2012-12-25 NOTE — Telephone Encounter (Signed)
12/25/12 Patient made aware of lab result PT/INR is therapeutic  Continue current dose of warfarin and  Recheck PT/INR on Monday. Appt scheduled for 12/30/12 P.[facility] BSN [facility]

## 2012-12-25 NOTE — Telephone Encounter (Signed)
12/25/12 Patient made aware of PT/INR this a.m. P.Yovan Leeman,RN BSN [facility]

## 2012-12-30 ENCOUNTER — Ambulatory Visit: Payer: Medicaid Other | Attending: Family Medicine

## 2012-12-30 ENCOUNTER — Telehealth: Payer: Self-pay | Admitting: *Deleted

## 2012-12-30 ENCOUNTER — Encounter: Payer: Self-pay | Admitting: Obstetrics & Gynecology

## 2012-12-30 ENCOUNTER — Ambulatory Visit (INDEPENDENT_AMBULATORY_CARE_PROVIDER_SITE_OTHER): Payer: Self-pay | Admitting: Obstetrics & Gynecology

## 2012-12-30 VITALS — BP 120/83 | HR 93 | Temp 97.9°F | Ht 70.0 in | Wt 262.0 lb

## 2012-12-30 DIAGNOSIS — N920 Excessive and frequent menstruation with regular cycle: Secondary | ICD-10-CM

## 2012-12-30 DIAGNOSIS — E669 Obesity, unspecified: Secondary | ICD-10-CM

## 2012-12-30 DIAGNOSIS — Z0000 Encounter for general adult medical examination without abnormal findings: Secondary | ICD-10-CM

## 2012-12-30 DIAGNOSIS — Z7901 Long term (current) use of anticoagulants: Secondary | ICD-10-CM

## 2012-12-30 LAB — CBC
Hemoglobin: 12 g/dL (ref 12.0–15.0)
MCH: 21.9 pg — ABNORMAL LOW (ref 26.0–34.0)
MCHC: 31.2 g/dL (ref 30.0–36.0)
Platelets: 402 10*3/uL — ABNORMAL HIGH (ref 150–400)

## 2012-12-30 LAB — POCT INR: INR: 3.7

## 2012-12-30 NOTE — Telephone Encounter (Signed)
Called patient to inform her to stop taking her coumadin today and tomorrow and to come in Wednesday for another INR and medication adjustment.  So far no answer on only phone number in system message box is full on phone.

## 2012-12-30 NOTE — Progress Notes (Signed)
  Subjective:    Patient ID: Donna [name], female    DOB: 1970/10/20, 42 y.o.   MRN: [id number]  HPI  42 yo S G1P1 (75 yo daughter) who is here today for f/u after admission to [facility] for DVT/PE. At that time she was noted to be anemic (Hbg nadir 8.8). She describes her periods as monthly, lasting 2-3 days but she will have 2-3 days of brown spotting prior to her period. She doesn't think that they are generally that heavy. But her last period in the hospital on coumadin was very heavy. Periods are not painful.  Review of Systems She is in a same sex relationship, monogamous for 3 years She is self-employed, Investment banker, corporate business Pap smear due, mammogram due    Objective:   Physical Exam  Breast- normal  EG- normal Cervix- normal Uterus- NSSA, normal adnexal exam       Assessment & Plan:  Preventative care- pap done today, mammogram ordered Menorrhagia (new onset) with coumadin. Schedule gyn u/s, check CBC today RTC 3 weeks

## 2012-12-30 NOTE — Progress Notes (Signed)
Patient states her periods last 2-3 days and for the past 6 months has been mildly heavy. When she was in the hospital for PE back in May, her cycle started and was extremely heavy the first day, like running down her legs constantly. Denies menstrual cycle multiple times per month, states hospital sent her here to follow up due to the heavy menses while admitted and being on blood thinners.

## 2012-12-31 ENCOUNTER — Telehealth: Payer: Self-pay | Admitting: *Deleted

## 2012-12-31 NOTE — Telephone Encounter (Signed)
12/1712 Patient made aware to not take coumadin today and come in on Wednesday for INR draw P.[facility] BSN [facility]

## 2013-01-01 ENCOUNTER — Ambulatory Visit: Payer: Medicaid Other | Attending: Family Medicine

## 2013-01-01 DIAGNOSIS — Z7901 Long term (current) use of anticoagulants: Secondary | ICD-10-CM

## 2013-01-01 LAB — POCT INR: INR: 1.9

## 2013-01-01 NOTE — Progress Notes (Unsigned)
Patient instructed to take 10 mg today and Thursday and then return for INR Friday per Dr. Elisabeth Pigeon.

## 2013-01-03 ENCOUNTER — Telehealth: Payer: Self-pay | Admitting: *Deleted

## 2013-01-03 ENCOUNTER — Ambulatory Visit: Payer: Self-pay | Attending: Family Medicine

## 2013-01-03 VITALS — BP 107/75 | HR 79 | Temp 98.5°F | Resp 16

## 2013-01-03 DIAGNOSIS — I2699 Other pulmonary embolism without acute cor pulmonale: Secondary | ICD-10-CM

## 2013-01-03 NOTE — Progress Notes (Unsigned)
Pt here for follow up PT/INR blood draw.vss

## 2013-01-03 NOTE — Telephone Encounter (Signed)
01/03/13 Patient made aware to continue Coumadin 10mg  and  Come back Monday for PT/INR  Per Dr. Elisabeth Pigeon. P.Pelagia Iacobucci,RN BSN

## 2013-01-06 ENCOUNTER — Ambulatory Visit: Payer: Self-pay | Attending: Family Medicine

## 2013-01-06 DIAGNOSIS — I2699 Other pulmonary embolism without acute cor pulmonale: Secondary | ICD-10-CM

## 2013-01-06 LAB — PROTIME-INR: INR: 2 — AB (ref 0.9–1.1)

## 2013-01-21 ENCOUNTER — Ambulatory Visit: Payer: Medicaid Other | Attending: Family Medicine

## 2013-01-21 ENCOUNTER — Other Ambulatory Visit: Payer: Self-pay | Admitting: Family Medicine

## 2013-01-21 DIAGNOSIS — Z7901 Long term (current) use of anticoagulants: Secondary | ICD-10-CM

## 2013-01-21 MED ORDER — FERROUS SULFATE 325 (65 FE) MG PO TABS: 325.0000 mg | ORAL_TABLET | Freq: Every day | ORAL | Status: DC

## 2013-01-21 MED ORDER — WARFARIN SODIUM 10 MG PO TABS: 10.0000 mg | ORAL_TABLET | Freq: Every day | ORAL | Status: DC

## 2013-01-21 NOTE — Progress Notes (Signed)
INR is 2.6.  Continue warfarin 10 mg po daily.  Recheck INR in 2 weeks.   Rx sent to [facility] for ferrous sulfate and warfarin 10 mg.  Rodney Langton, MD, CDE, FAAFP [facility] [place], [place]

## 2013-01-29 ENCOUNTER — Ambulatory Visit: Payer: Self-pay | Admitting: Obstetrics & Gynecology

## 2013-02-04 ENCOUNTER — Ambulatory Visit: Payer: Self-pay | Attending: Family Medicine

## 2013-02-04 DIAGNOSIS — Z7901 Long term (current) use of anticoagulants: Secondary | ICD-10-CM

## 2013-02-12 ENCOUNTER — Ambulatory Visit: Payer: Medicaid Other | Attending: Family Medicine | Admitting: Family Medicine

## 2013-02-12 VITALS — BP 113/78 | HR 75 | Temp 98.0°F | Resp 16 | Ht 70.0 in | Wt 264.0 lb

## 2013-02-12 DIAGNOSIS — Z86711 Personal history of pulmonary embolism: Secondary | ICD-10-CM | POA: Insufficient documentation

## 2013-02-12 DIAGNOSIS — I2699 Other pulmonary embolism without acute cor pulmonale: Secondary | ICD-10-CM | POA: Insufficient documentation

## 2013-02-12 DIAGNOSIS — Z79899 Other long term (current) drug therapy: Secondary | ICD-10-CM | POA: Insufficient documentation

## 2013-02-12 DIAGNOSIS — Z86718 Personal history of other venous thrombosis and embolism: Secondary | ICD-10-CM | POA: Insufficient documentation

## 2013-02-12 DIAGNOSIS — N920 Excessive and frequent menstruation with regular cycle: Secondary | ICD-10-CM

## 2013-02-12 DIAGNOSIS — E669 Obesity, unspecified: Secondary | ICD-10-CM

## 2013-02-12 DIAGNOSIS — Z0000 Encounter for general adult medical examination without abnormal findings: Secondary | ICD-10-CM | POA: Insufficient documentation

## 2013-02-12 DIAGNOSIS — Z7901 Long term (current) use of anticoagulants: Secondary | ICD-10-CM | POA: Insufficient documentation

## 2013-02-12 DIAGNOSIS — D649 Anemia, unspecified: Secondary | ICD-10-CM | POA: Insufficient documentation

## 2013-02-12 LAB — LIPID PANEL
Cholesterol: 244 mg/dL — ABNORMAL HIGH (ref 0–200)
Triglycerides: 166 mg/dL — ABNORMAL HIGH (ref <150)
VLDL: 33 mg/dL (ref 0–40)

## 2013-02-12 LAB — CBC
MCH: 24.8 pg — ABNORMAL LOW (ref 26.0–34.0)
MCHC: 31.8 g/dL (ref 30.0–36.0)
MCV: 77.8 fL — ABNORMAL LOW (ref 78.0–100.0)
Platelets: 427 10*3/uL — ABNORMAL HIGH (ref 150–400)
RBC: 5.17 MIL/uL — ABNORMAL HIGH (ref 3.87–5.11)
RDW: 25.5 % — ABNORMAL HIGH (ref 11.5–15.5)

## 2013-02-12 LAB — ANEMIA PANEL
Folate: 7.1 ng/mL
Iron: 35 ug/dL — ABNORMAL LOW (ref 42–145)
Retic Ct Pct: 1.4 % (ref 0.4–2.3)
Vitamin B-12: 664 pg/mL (ref 211–911)

## 2013-02-12 LAB — POCT INR: INR: 2.3

## 2013-02-12 MED ORDER — INSULIN ASPART 100 UNIT/ML ~~LOC~~ SOLN: 10.0000 [IU] | Freq: Once | SUBCUTANEOUS | Status: DC

## 2013-02-12 NOTE — Progress Notes (Deleted)
Patient ID: Donna [name], female   DOB: 04/15/1971, 42 y.o.   MRN: [id number]  CC:  Patient presents to the clinic because she is out of her insulin since yesterday. Her current regimen includes Lantus 40 units at night and Novalog after counting carbs.  Patient states she doesn't  eat regular meals and rather snacks, therefore, she doesn't take her postprandial insulin.  Patient educated that she  should be giving herself about 4 injections per day. Per Patient, A1C is usually around 11.  She follows up with endocrinology, Dr.  Maple Hudson,  and was on the pump but is no longer on it due to financial reasons. Blood glucose today is 452, and we will give insulin injection in the clinic today.  Patient educated about importance of not running out of her insulin, and that once she obtains her orange card, she should be able to have insulin.      HPI:  No Known Allergies Past Medical History  Diagnosis Date  . Lung collapse   . Pulmonary embolism, blood-clot, obstetric    Current Outpatient Prescriptions on File Prior to Visit  Medication Sig Dispense Refill  . acetaminophen (TYLENOL) 500 MG tablet Take 500 mg by mouth every 6 (six) hours as needed for pain.      . ferrous sulfate 325 (65 FE) MG tablet Take 1 tablet (325 mg total) by mouth daily with breakfast.  30 tablet  3  . warfarin (COUMADIN) 10 MG tablet Take 1 tablet (10 mg total) by mouth daily.  20 tablet  1   No current facility-administered medications on file prior to visit.   Family History  Problem Relation Age of Onset  . COPD Father   . Stroke Father    History   Social History  . Marital Status: Single    Spouse Name: N/A    Number of Children: N/A  . Years of Education: N/A   Occupational History  . Not on file.   Social History Main Topics  . Smoking status: Never Smoker   . Smokeless tobacco: Never Used  . Alcohol Use: Yes     Comment: socially   . Drug Use: No  . Sexually Active: Not Currently    Birth  Control/ Protection: None   Other Topics Concern  . Not on file   Social History Narrative  . No narrative on file    Review of Systems  Constitutional: Negative for fever, chills, diaphoresis, activity change, appetite change and fatigue.  HENT: Negative for ear pain, nosebleeds, congestion, facial swelling, rhinorrhea, neck pain, neck stiffness and ear discharge.   Eyes: Negative for pain, discharge, redness, itching and visual disturbance.  Respiratory: Negative for cough, choking, chest tightness, shortness of breath, wheezing and stridor.   Cardiovascular: Negative for chest pain, palpitations and leg swelling.  Gastrointestinal: Negative for abdominal distention.  Genitourinary: Negative for dysuria, urgency, frequency, hematuria, flank pain, decreased urine volume, difficulty urinating and dyspareunia.  Musculoskeletal: Negative for back pain, joint swelling, arthralgias and gait problem.  Neurological: Negative for dizziness, tremors, seizures, syncope, facial asymmetry, speech difficulty, weakness, light-headedness, numbness and headaches.  Hematological: Negative for adenopathy. Does not bruise/bleed easily.  Psychiatric/Behavioral: Negative for hallucinations, behavioral problems, confusion, dysphoric mood, decreased concentration and agitation.    Objective:  There were no vitals filed for this visit.  Physical Exam  Constitutional: Appears well-developed and well-nourished. No distress.  HENT: Normocephalic. External right and left ear normal. Oropharynx is clear and moist.  Eyes: Conjunctivae  and EOM are normal. PERRLA, no scleral icterus.  Neck: Normal ROM. Neck supple. No JVD. No tracheal deviation. No thyromegaly.  CVS: RRR, S1/S2 +, no murmurs, no gallops, no carotid bruit.  Pulmonary: Effort and breath sounds normal, no stridor, rhonchi, wheezes, rales.  Abdominal: Soft. BS +,  no distension, tenderness, rebound or guarding.  Musculoskeletal: Normal range of  motion. No edema and no tenderness.  Lymphadenopathy: No lymphadenopathy noted, cervical, inguinal. Neuro: Alert. Normal reflexes, muscle tone coordination. No cranial nerve deficit. Skin: Skin is warm and dry. No rash noted. Not diaphoretic. No erythema. No pallor.  Psychiatric: Normal mood and affect. Behavior, judgment, thought content normal.   Lab Results  Component Value Date   WBC 5.9 12/30/2012   HGB 12.0 12/30/2012   HCT 38.5 12/30/2012   MCV 70.4* 12/30/2012   PLT 402* 12/30/2012   Lab Results  Component Value Date   CREATININE 1.04 12/07/2012   BUN 15 12/07/2012   NA 135 12/07/2012   K 4.0 12/07/2012   CL 103 12/07/2012   CO2 24 12/07/2012    No results found for this basename: HGBA1C   Lipid Panel  No results found for this basename: chol, trig, hdl, cholhdl, vldl, ldlcalc       Assessment and plan:   Patient Active Problem List   Diagnosis Date Noted  . Obesity 12/30/2012  . Anticoagulation monitoring, INR range 2-3 12/16/2012  . Other pulmonary embolism and infarction 12/16/2012  . Menorrhagia 12/16/2012  . 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 DVT (deep venous thrombosis) 12/06/2012  . Pulmonary embolism 12/05/2012  . Anemia 12/05/2012       BS is 452 in office    10 units of novolog given in office

## 2013-02-12 NOTE — Patient Instructions (Addendum)
Anticoagulation, Generic Anticoagulants are medications used to prevent clots from developing in your veins. These medications are also known as blood thinners. If blood clots are untreated, they could travel to your lungs. This is called a pulmonary embolus. A blood clot in your lungs can be fatal.  Caregivers often use anticoagulants to prevent clots following surgery. Anticoagulants are also used along with aspirin when the heart is not getting enough blood. Another anticoagulant called warfarin is started 2 to 3 days after a rapid-acting injectable anticoagulant is started. The rapid-acting anticoagulants are usually continued until warfarin has begun to work. Your caregiver will judge this length of time by blood tests known as the prothrombin time (PT) and International Normalization Ratio (INR). This means that your blood is at the necessary and best level to prevent clots. RISKS AND COMPLICATIONS  If you have received recent epidural anesthesia, spinal anesthesia, or a spinal tap while receiving anticoagulants, you are at risk for developing a blood clot in or around the spine. This condition could result in long-term or permanent paralysis.  Because anticoagulants thin your blood, severe bleeding may occur from any tissue or organ. Symptoms of the blood being too thin may include:  Bleeding from the nose or gums that does not stop quickly.  Unusual bruising or bruising easily.  Swelling or pain at an injection site.  A cut that does not stop bleeding within 10 minutes.  Continual nausea for more than 1 day or vomiting blood.  Coughing up blood.  Blood in the urine which may appear as pink, red, or brown urine.  Blood in bowel movements which may appear as red, dark or black stools.  Sudden weakness or numbness of the face, arm, or leg, especially on one side of the body.  Sudden confusion.  Trouble speaking (aphasia) or understanding.  Sudden trouble seeing in one or both  eyes.  Sudden trouble walking.  Dizziness.  Loss of balance or coordination.  Severe pain, such as a headache, joint pain, or back pain.  Fever.  Too little anticoagulation continues to allow the risk for blood clots. HOME CARE INSTRUCTIONS   Due to the complications of anticoagulants, it is very important that you take your anticoagulant as directed by your caregiver. Anticoagulants need to be taken exactly as instructed. Be sure you understand all your anticoagulant instructions.  Warfarin. Your caregiver will advise you on the length of treatment (usually 3 6 months, sometimes lifelong).  Take warfarin exactly as directed by your caregiver. It is recommended that you take your warfarin dose at the same time of the day. It is preferred that you take warfarin in the late afternoon. If you have been told to stop taking warfarin, do not resume taking warfarin until directed to do so by your caregiver. Follow your caregiver's instructions if you accidentally take an extra dose or miss a dose of warfarin. It is very important to take warfarin as directed since bleeding or blood clots could result in chronic or permanent injury, pain, or disability.  Too much and too little warfarin are both dangerous. Too much warfarin increases the risk of bleeding. Too little warfarin continues to allow the risk for blood clots. While taking warfarin, you will need to have regular blood tests to measure your blood clotting time. These blood tests usually include both the PT and INR tests. The PT and INR results allow your caregiver to adjust your dose of warfarin. The dose can change for many reasons. It is critically   important that you take warfarin exactly as prescribed, and that you have your PT and INR levels drawn exactly as directed. Follow up with your laboratory test appointments as directed. It is very important to keep your lab appointments. Not keeping lab appointments could result in a chronic or  permanent injury, pain, or disability.  Many foods, especially foods high in vitamin K can interfere with warfarin and affect the PT and INR results. Foods high in vitamin K include spinach, kale, broccoli, cabbage, collard and turnip greens, brussels sprouts, peas, cauliflower, seaweed, and parsley as well as beef and pork liver, green tea, and soybean oil. You should eat a consistent amount of foods high in vitamin K. Avoid major changes in your diet, or notify your caregiver before changing your diet. Arrange a visit with a dietitian to answer your questions.  Many medicines can interfere with warfarin and affect the PT and INR results. You must tell your caregiver about any and all medicines you take, this includes all vitamins and supplements. Ask your caregiver before taking these. Prescription and over-the-counter medicine consistency is critical to warfarin management. It is important that potential interactions are checked before you start a new medicine. Be especially cautious with aspirin and anti-inflammatory medicines. Ask your caregiver before taking these. Medicines such as antibiotics and acid-reducing medicine can interact with warfarin and can cause an increased warfarin effect. Warfarin can also interfere with the effectiveness of medicines you are taking. Do not take or discontinue any prescribed or over-the-counter medicine except on the advice of your caregiver or pharmacist.  Some vitamins, supplements, and herbal products interfere with the effectiveness of warfarin. Vitamin E may increase the anticoagulant effects of warfarin. Vitamin K may can cause warfarin to be less effective. Do not take or discontinue any vitamin, supplement, or herbal product except on the advice of your caregiver or pharmacist.  Alcohol can change the body's ability to handle warfarin. It is best to avoid alcoholic drinks or consume only very small amounts while taking warfarin. Notify your caregiver if you  change your alcohol intake. A sudden increase in alcohol use can increase your risk of bleeding. Chronic alcohol use can cause warfarin to be less effective.  If you have a loss of appetite or get the stomach flu (viral gastroenteritis), talk to your caregiver as soon as possible. A decrease in your normal vitamin K intake can make you more sensitive to your usual dose of warfarin.  Some medical conditions may increase your risk for bleeding while you are taking warfarin. A fever, diarrhea lasting more than a day, worsening heart failure, or worsening liver function are some medical conditions that could affect warfarin. Contact your caregiver if you have any of these medical conditions.  Warfarin can have side effects, such as excessive bruising or bleeding. You will need to hold pressure over cuts for longer than usual.  Be careful not to cut yourself when using sharp objects.  Notify your dentist or other caregivers before procedures.  Limit physical activities or sports that could result in a fall or cause injury. Avoid contact sports.  Wear a medical alert bracelet or carry a medical alert card. SEEK MEDICAL CARE IF:   You develop any rashes.  You have any worsening of the condition for which you are receiving anticoagulation therapy. SEEK IMMEDIATE MEDICAL CARE IF:   Bleeding from the nose or gums does not stop quickly.  You have unusual bruising or are bruising easily.  Swelling or pain occurs   at an injection site.  A cut does not stop bleeding within 10 minutes.  You have continual nausea for more than 1 day or are vomiting blood.  You are coughing up blood.  You have blood in the urine.  You have dark or black stools.  You have sudden weakness or numbness of the face, arm, or leg, especially on one side of the body.  You have sudden confusion.  You have trouble speaking (aphasia) or understanding.  You have sudden trouble seeing in one or both eyes.  You have  sudden trouble walking.  You have dizziness.  You have a loss of balance or coordination.  You have severe pain, such as a headache, joint pain, or back pain.  You have a serious fall or head injury, even if you are not bleeding.  You have an oral temperature above 102 F (38.9 C), not controlled by medicine. ANY OF THESE SYMPTOMS MAY REPRESENT A SERIOUS PROBLEM THAT IS AN EMERGENCY. Do not wait to see if the symptoms will go away. Get medical help right away. Call your local emergency services (911 in [place] DO NOT drive yourself to the hospital. MAKE SURE YOU:   Understand these instructions.  Will watch your condition.  Will get help right away if you are not doing well or get worse. Document Released: 07/03/2005 Document Revised: 03/27/2012 Document Reviewed: 02/05/2008 [employer] Patient Information 2014 [employer].  

## 2013-02-12 NOTE — Progress Notes (Signed)
Patient ID: Donna [name], female   DOB: 05-09-1971, 42 y.o.   MRN: [id number]  CC:  Chief Complaint  Patient presents with  . Follow-up  . Warfarin Sensitivity   HPI: Pt reports that she has been well. She is taking her medications as prescribed.  She would like to have her labs done today.     No Known Allergies Past Medical History  Diagnosis Date  . Lung collapse   . Pulmonary embolism, blood-clot, obstetric    Current Outpatient Prescriptions on File Prior to Visit  Medication Sig Dispense Refill  . acetaminophen (TYLENOL) 500 MG tablet Take 500 mg by mouth every 6 (six) hours as needed for pain.      . ferrous sulfate 325 (65 FE) MG tablet Take 1 tablet (325 mg total) by mouth daily with breakfast.  30 tablet  3  . warfarin (COUMADIN) 10 MG tablet Take 1 tablet (10 mg total) by mouth daily.  20 tablet  1   No current facility-administered medications on file prior to visit.   Family History  Problem Relation Age of Onset  . COPD Father   . Stroke Father    History   Social History  . Marital Status: Single    Spouse Name: N/A    Number of Children: N/A  . Years of Education: N/A   Occupational History  . Not on file.   Social History Main Topics  . Smoking status: Never Smoker   . Smokeless tobacco: Never Used  . Alcohol Use: Yes     Comment: socially   . Drug Use: No  . Sexually Active: Not Currently    Birth Control/ Protection: None   Other Topics Concern  . Not on file   Social History Narrative  . No narrative on file    Review of Systems  Constitutional: Negative for fever, chills, diaphoresis, activity change, appetite change and fatigue.  HENT: Negative for ear pain, nosebleeds, congestion, facial swelling, rhinorrhea, neck pain, neck stiffness and ear discharge.   Eyes: Negative for pain, discharge, redness, itching and visual disturbance.  Respiratory: Negative for cough, choking, chest tightness, shortness of breath, wheezing and  stridor.   Cardiovascular: Negative for chest pain, palpitations and leg swelling.  Gastrointestinal: Negative for abdominal distention.  Genitourinary: Negative for dysuria, urgency, frequency, hematuria, flank pain, decreased urine volume, difficulty urinating and dyspareunia.  Musculoskeletal: Negative for back pain, joint swelling, arthralgias and gait problem.  Neurological: Negative for dizziness, tremors, seizures, syncope, facial asymmetry, speech difficulty, weakness, light-headedness, numbness and headaches.  Hematological: Negative for adenopathy. Does not bruise/bleed easily.  Psychiatric/Behavioral: Negative for hallucinations, behavioral problems, confusion, dysphoric mood, decreased concentration and agitation.    Objective:   Filed Vitals:   02/12/13 1256  BP: 113/78  Pulse: 75  Temp: 98 F (36.7 C)  Resp: 16    Physical Exam  Constitutional: Appears well-developed and well-nourished. No distress.  HENT: Normocephalic. External right and left ear normal. Oropharynx is clear and moist.  Eyes: Conjunctivae and EOM are normal. PERRLA, no scleral icterus.  Neck: Normal ROM. Neck supple. No JVD. No tracheal deviation. No thyromegaly.  CVS: RRR, S1/S2 +, no murmurs, no gallops, no carotid bruit.  Pulmonary: Effort and breath sounds normal, no stridor, rhonchi, wheezes, rales.  Abdominal: Soft. BS +,  no distension, tenderness, rebound or guarding.  Musculoskeletal: Normal range of motion. No edema and no tenderness.  Lymphadenopathy: No lymphadenopathy noted, cervical, inguinal. Neuro: Alert. Normal reflexes, muscle tone coordination. No  cranial nerve deficit. Skin: Skin is warm and dry. No rash noted. Not diaphoretic. No erythema. No pallor.  Psychiatric: Normal mood and affect. Behavior, judgment, thought content normal.   Lab Results  Component Value Date   WBC 5.9 12/30/2012   HGB 12.0 12/30/2012   HCT 38.5 12/30/2012   MCV 70.4* 12/30/2012   PLT 402* 12/30/2012    Lab Results  Component Value Date   CREATININE 1.04 12/07/2012   BUN 15 12/07/2012   NA 135 12/07/2012   K 4.0 12/07/2012   CL 103 12/07/2012   CO2 24 12/07/2012    No results found for this basename: HGBA1C   Lipid Panel  No results found for this basename: chol, trig, hdl, cholhdl, vldl, ldlcalc       Assessment and plan:   Patient Active Problem List   Diagnosis Date Noted  . Pulmonary embolus 02/12/2013  . High risk medication use 02/12/2013  . Chronic anticoagulation 02/12/2013  . Healthcare maintenance 02/12/2013  . Obesity 12/30/2012  . Anticoagulation monitoring, INR range 2-3 12/16/2012  . Other pulmonary embolism and infarction 12/16/2012  . Menorrhagia 12/16/2012  . 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 DVT (deep venous thrombosis) 12/06/2012  . Pulmonary embolism 12/05/2012  . Anemia 12/05/2012   The patient was given clear instructions to go to ER or return to medical center if symptoms don't improve, worsen or new problems develop.  The patient verbalized understanding.  The patient was told to call to get any lab results if not heard anything in the next week.    Check INR today     Anemia - Plan: CBC, Anemia panel, Lipid panel, COMPLETE METABOLIC PANEL WITH GFR  Pulmonary embolus - Plan: CBC, Anemia panel, Lipid panel, COMPLETE METABOLIC PANEL WITH GFR  High risk medication use - Plan: CBC, Anemia panel, Lipid panel, COMPLETE METABOLIC PANEL WITH GFR, INR  Chronic anticoagulation - Plan: CBC, Anemia panel, Lipid panel, COMPLETE METABOLIC PANEL WITH GFR, INR  Healthcare maintenance - Plan: CBC, Anemia panel, Lipid panel, COMPLETE METABOLIC PANEL WITH GFR  RTC in 3 months  C. Cyndie Mull, MD, CDE, FAAFP [facility], [place]   Results for orders placed in visit on 02/12/13  CBC      Result Value Range   WBC 4.5  4.0 - 10.5 K/uL   RBC 5.17 (*) 3.87 - 5.11 MIL/uL   Hemoglobin 12.8  12.0 - 15.0 g/dL   HCT 16.1  09.6 -  04.5 %   MCV 77.8 (*) 78.0 - 100.0 fL   MCH 24.8 (*) 26.0 - 34.0 pg   MCHC 31.8  30.0 - 36.0 g/dL   RDW 40.9 (*) 81.1 - 91.4 %   Platelets 427 (*) 150 - 400 K/uL  ANEMIA PANEL      Result Value Range   Retic Ct Pct 1.4  0.4 - 2.3 %   RBC. 5.17 (*) 3.87 - 5.11 MIL/uL   ABS Retic 72.4  19.0 - 186.0 K/uL   Iron 35 (*) 42 - 145 ug/dL   UIBC 782  956 - 213 ug/dL   TIBC 086  578 - 469 ug/dL   %SAT 10 (*) 20 - 55 %   Vitamin B-12 664  211 - 911 pg/mL   Folate 7.1     Ferritin 13  10 - 291 ng/mL  LIPID PANEL      Result Value Range   Cholesterol 244 (*) 0 - 200 mg/dL   Triglycerides 629 (*) <  150 mg/dL   HDL 37 (*) >16 mg/dL   Total CHOL/HDL Ratio 6.6     VLDL 33  0 - 40 mg/dL   LDL Cholesterol 109 (*) 0 - 99 mg/dL  COMPLETE METABOLIC PANEL WITH GFR      Result Value Range   Sodium 136  135 - 145 mEq/L   Potassium 4.2  3.5 - 5.3 mEq/L   Chloride 105  96 - 112 mEq/L   CO2 26  19 - 32 mEq/L   Glucose, Bld 80  70 - 99 mg/dL   BUN 9  6 - 23 mg/dL   Creat 6.04  5.40 - 9.81 mg/dL   Total Bilirubin 0.3  0.3 - 1.2 mg/dL   Alkaline Phosphatase 63  39 - 117 U/L   AST 39 (*) 0 - 37 U/L   ALT 46 (*) 0 - 35 U/L   Total Protein 7.0  6.0 - 8.3 g/dL   Albumin 3.4 (*) 3.5 - 5.2 g/dL   Calcium 9.0  8.4 - 19.1 mg/dL   GFR, Est African American 83     GFR, Est Non African American 72    POCT INR      Result Value Range   INR 2.3

## 2013-02-12 NOTE — Progress Notes (Signed)
Patient presents for follow up; [facility] patient isn't really sure why she is supposed to come here because she comes in for INR every week already.

## 2013-02-13 ENCOUNTER — Telehealth: Payer: Self-pay | Admitting: *Deleted

## 2013-02-13 LAB — COMPLETE METABOLIC PANEL WITH GFR
BUN: 9 mg/dL (ref 6–23)
CO2: 26 mEq/L (ref 19–32)
Calcium: 9 mg/dL (ref 8.4–10.5)
Chloride: 105 mEq/L (ref 96–112)
Creat: 0.98 mg/dL (ref 0.50–1.10)
GFR, Est African American: 83 mL/min

## 2013-02-13 NOTE — Telephone Encounter (Signed)
02/13/13 Patient made aware of lab results cholesterol levels are veery high and liver enzymes are mildly elevated. Patient instructed to start a low fat low cholesterol diet and increase physical activity. P.Kymia Simi,RN BSN [facility]

## 2013-02-13 NOTE — Progress Notes (Signed)
Quick Note:  Please inform patient that her hemoglobin and iron levels are starting to improve. Her cholesterol levels are very high and her liver enzymes are mildly elevated. Recommend she start a low fat low cholesterol diet and increase physical activity. Labs should be rechecked in 3 months.   Rodney Langton, MD, CDE, FAAFP [facility], [place]   ______

## 2013-02-14 NOTE — Progress Notes (Signed)
In reference to the telephone note from June 2nd, 2014. This patient was seen in the office July 30th.  INR and coumadin dosage was addressed.

## 2013-02-18 ENCOUNTER — Other Ambulatory Visit: Payer: Self-pay

## 2013-02-25 ENCOUNTER — Ambulatory Visit: Payer: Medicaid Other | Attending: Family Medicine

## 2013-02-25 DIAGNOSIS — Z7901 Long term (current) use of anticoagulants: Secondary | ICD-10-CM

## 2013-02-25 LAB — POCT INR: INR: 3.2

## 2013-02-28 ENCOUNTER — Other Ambulatory Visit: Payer: Self-pay | Admitting: Family Medicine

## 2013-02-28 DIAGNOSIS — I2699 Other pulmonary embolism without acute cor pulmonale: Secondary | ICD-10-CM

## 2013-02-28 MED ORDER — WARFARIN SODIUM 10 MG PO TABS: 10.0000 mg | ORAL_TABLET | Freq: Every day | ORAL | Status: DC

## 2013-02-28 NOTE — Telephone Encounter (Signed)
Pt called in to refill warfarin (COUMADIN) 10 MG tablet; pt has two tablets left; pt still uses [facility] on [address]

## 2013-03-04 ENCOUNTER — Ambulatory Visit: Payer: Medicaid Other | Attending: Family Medicine

## 2013-03-04 DIAGNOSIS — Z7901 Long term (current) use of anticoagulants: Secondary | ICD-10-CM

## 2013-03-18 ENCOUNTER — Ambulatory Visit: Payer: Medicaid Other | Attending: Internal Medicine

## 2013-03-18 DIAGNOSIS — Z7901 Long term (current) use of anticoagulants: Secondary | ICD-10-CM

## 2013-04-01 ENCOUNTER — Ambulatory Visit: Payer: Medicaid Other | Attending: Family Medicine

## 2013-04-01 VITALS — BP 108/74 | HR 71 | Temp 98.2°F | Resp 16

## 2013-04-01 DIAGNOSIS — Z7901 Long term (current) use of anticoagulants: Secondary | ICD-10-CM

## 2013-04-01 NOTE — Addendum Note (Signed)
Addended by: Lestine Mount on: 04/01/2013 09:46 AM   Modules accepted: Orders

## 2013-04-01 NOTE — Progress Notes (Signed)
PT HERE FOR inr check. Taking Coumadin 10 mg daily. Denies bleeding or pain

## 2013-04-08 ENCOUNTER — Ambulatory Visit: Payer: Medicaid Other | Attending: Internal Medicine

## 2013-04-08 DIAGNOSIS — Z7901 Long term (current) use of anticoagulants: Secondary | ICD-10-CM

## 2013-04-22 ENCOUNTER — Ambulatory Visit: Payer: Medicaid Other | Attending: Internal Medicine

## 2013-04-22 VITALS — BP 134/82 | HR 69 | Temp 98.2°F | Resp 16 | Ht 69.0 in | Wt 275.6 lb

## 2013-04-22 DIAGNOSIS — Z09 Encounter for follow-up examination after completed treatment for conditions other than malignant neoplasm: Secondary | ICD-10-CM | POA: Insufficient documentation

## 2013-04-22 DIAGNOSIS — Z7901 Long term (current) use of anticoagulants: Secondary | ICD-10-CM | POA: Insufficient documentation

## 2013-04-22 NOTE — Patient Instructions (Signed)
Pt is to not take her medication today. Tomorrow until Thursday take 7.5  Pt is to come in on Friday to recheck INR

## 2013-04-22 NOTE — Progress Notes (Signed)
Pt is here today for labs only. 

## 2013-04-25 ENCOUNTER — Ambulatory Visit: Payer: Medicaid Other | Attending: Internal Medicine

## 2013-04-25 DIAGNOSIS — Z7901 Long term (current) use of anticoagulants: Secondary | ICD-10-CM

## 2013-04-25 LAB — POCT INR: INR: 1.6

## 2013-04-28 ENCOUNTER — Other Ambulatory Visit: Payer: Self-pay | Admitting: Internal Medicine

## 2013-04-29 NOTE — Telephone Encounter (Signed)
Warfarin 10 mg reordered

## 2013-05-02 ENCOUNTER — Ambulatory Visit: Payer: Medicaid Other | Attending: Internal Medicine

## 2013-05-02 ENCOUNTER — Telehealth: Payer: Self-pay | Admitting: Emergency Medicine

## 2013-05-02 ENCOUNTER — Other Ambulatory Visit: Payer: Self-pay | Admitting: Emergency Medicine

## 2013-05-02 DIAGNOSIS — Z09 Encounter for follow-up examination after completed treatment for conditions other than malignant neoplasm: Secondary | ICD-10-CM | POA: Insufficient documentation

## 2013-05-02 MED ORDER — WARFARIN SODIUM 10 MG PO TABS: 10.0000 mg | ORAL_TABLET | Freq: Every day | ORAL | Status: DC

## 2013-05-02 MED ORDER — WARFARIN SODIUM 2 MG PO TABS: ORAL_TABLET | ORAL | Status: DC

## 2013-05-02 NOTE — Patient Instructions (Signed)
Pt's INR is 1.7 today. instructed the pt to take 12.5 mg today and for the rest of the week take 10 mg and return to clinic in 7 days to recheck.

## 2013-05-02 NOTE — Telephone Encounter (Signed)
Pt given instructions to take 3mg  tab coumadin with 10mg  tab today then resume normal dosage 10mg  until next week repeat. Refill prescribed

## 2013-05-02 NOTE — Progress Notes (Signed)
Pt is here today for labs only. INR result 1.7

## 2013-05-02 NOTE — Addendum Note (Signed)
Addended by: Nonnie Done D on: 05/02/2013 10:10 AM   Modules accepted: Orders, Medications

## 2013-05-09 ENCOUNTER — Ambulatory Visit: Payer: Medicaid Other | Attending: Internal Medicine

## 2013-05-09 DIAGNOSIS — Z7901 Long term (current) use of anticoagulants: Secondary | ICD-10-CM

## 2013-05-09 LAB — POCT INR: INR: 1.7

## 2013-05-16 ENCOUNTER — Ambulatory Visit: Payer: Medicaid Other | Attending: Internal Medicine

## 2013-05-16 DIAGNOSIS — Z7901 Long term (current) use of anticoagulants: Secondary | ICD-10-CM

## 2013-05-16 DIAGNOSIS — I2699 Other pulmonary embolism without acute cor pulmonale: Secondary | ICD-10-CM

## 2013-05-16 LAB — LIPID PANEL
Cholesterol: 186 mg/dL (ref 0–200)
HDL: 35 mg/dL — ABNORMAL LOW (ref >39)
LDL Cholesterol: 115 mg/dL — ABNORMAL HIGH (ref 0–99)
Total CHOL/HDL Ratio: 5.3 Ratio
Triglycerides: 182 mg/dL — ABNORMAL HIGH (ref <150)
VLDL: 36 mg/dL (ref 0–40)

## 2013-05-16 LAB — POCT INR: INR: 3.5

## 2013-05-16 LAB — HEPATIC FUNCTION PANEL
AST: 14 U/L (ref 0–37)
Alkaline Phosphatase: 51 U/L (ref 39–117)
Bilirubin, Direct: <0.1 mg/dL (ref 0.0–0.3)
Total Bilirubin: 0.2 mg/dL — ABNORMAL LOW (ref 0.3–1.2)

## 2013-05-22 ENCOUNTER — Other Ambulatory Visit: Payer: Self-pay

## 2013-05-23 ENCOUNTER — Ambulatory Visit: Payer: Medicaid Other | Attending: Internal Medicine

## 2013-05-23 DIAGNOSIS — Z7901 Long term (current) use of anticoagulants: Secondary | ICD-10-CM

## 2013-05-23 LAB — POCT INR: INR: 5.3

## 2013-05-28 ENCOUNTER — Ambulatory Visit: Payer: Medicaid Other | Attending: Internal Medicine

## 2013-05-28 DIAGNOSIS — I2699 Other pulmonary embolism without acute cor pulmonale: Secondary | ICD-10-CM

## 2013-06-02 ENCOUNTER — Ambulatory Visit: Payer: Medicaid Other | Attending: Internal Medicine

## 2013-06-02 DIAGNOSIS — Z7901 Long term (current) use of anticoagulants: Secondary | ICD-10-CM

## 2013-06-03 ENCOUNTER — Ambulatory Visit: Payer: Medicaid Other | Attending: Internal Medicine | Admitting: Internal Medicine

## 2013-06-03 VITALS — BP 126/78 | HR 72 | Temp 98.0°F | Resp 16 | Ht 69.0 in | Wt 275.0 lb

## 2013-06-03 DIAGNOSIS — I82409 Acute embolism and thrombosis of unspecified deep veins of unspecified lower extremity: Secondary | ICD-10-CM

## 2013-06-03 DIAGNOSIS — I749 Embolism and thrombosis of unspecified artery: Secondary | ICD-10-CM | POA: Insufficient documentation

## 2013-06-03 NOTE — Progress Notes (Signed)
Patient is here to manage ;medication Hoping to come off of her coumadin Has been on it for  6 months

## 2013-06-03 NOTE — Progress Notes (Signed)
Patient Demographics  Donna [name], is a 42 y.o. female  ZOX:[id number]  [id number]  DOB - 1970/12/07  Chief Complaint  Patient presents with  . Medication Management        Subjective:   [address] [employer] today is here for a follow up visit. She has a history of having a DVT/PE in May of 2014. Not sure whether this is provoked by a leg sprain that she had a few days before her developing symptoms. She did have a hypercoagulable workup which showed positive lupus anticoagulant and decreased protein C levels. She was discharged from the hospital on 5/28, she is inquiring whether she can come off anticoagulation.  She never has had prior venous embolism in the past, has one child with no history of having miscarriages.   Patient has No headache, No chest pain, No abdominal pain - No Nausea, No new weakness tingling or numbness, No Cough - SOB.   Objective:    Filed Vitals:   06/03/13 0914  BP: 126/78  Pulse: 72  Temp: 98 F (36.7 C)  Resp: 16  Height: 5\' 9"  (1.753 m)  Weight: 275 lb (124.739 kg)  SpO2: 100%     ALLERGIES:  No Known Allergies  PAST MEDICAL HISTORY: Past Medical History  Diagnosis Date  . Lung collapse   . Pulmonary embolism, blood-clot, obstetric     MEDICATIONS AT HOME: Prior to Admission medications   Medication Sig Start Date End Date Taking? Authorizing Provider  acetaminophen (TYLENOL) 500 MG tablet Take 500 mg by mouth every 6 (six) hours as needed for pain.    Historical Provider, MD  ferrous sulfate 325 (65 FE) MG tablet Take 1 tablet (325 mg total) by mouth daily with breakfast. 01/21/13   [place] Cyndie Mull, MD  warfarin (COUMADIN) 10 MG tablet Take 1 tablet (10 mg total) by mouth daily. 05/02/13   Jeanann Lewandowsky, MD     Exam  General appearance :Awake, alert, not in any distress. Speech Clear. Not toxic Looking HEENT: Atraumatic and Normocephalic, pupils equally reactive to light and accomodation Neck: supple, no JVD. No  cervical lymphadenopathy.  Chest:Good air entry bilaterally, no added sounds  CVS: S1 S2 regular, no murmurs.  Abdomen: Bowel sounds present, Non tender and not distended with no gaurding, rigidity or rebound. Extremities: B/L Lower Ext shows no edema, both legs are warm to touch Neurology: Awake alert, and oriented X 3, CN II-XII intact, Non focal Skin:No Rash Wounds:N/A    Data Review   CBC No results found for this basename: WBC, HGB, HCT, PLT, MCV, MCH, MCHC, RDW, NEUTRABS, LYMPHSABS, MONOABS, EOSABS, BASOSABS, BANDABS, BANDSABD,  in the last 168 hours  Chemistries   No results found for this basename: NA, K, CL, CO2, GLUCOSE, BUN, CREATININE, GFRCGP, CALCIUM, MG, AST, ALT, ALKPHOS, BILITOT,  in the last 168 hours ------------------------------------------------------------------------------------------------------------------ No results found for this basename: HGBA1C,  in the last 72 hours ------------------------------------------------------------------------------------------------------------------ No results found for this basename: CHOL, HDL, LDLCALC, TRIG, CHOLHDL, LDLDIRECT,  in the last 72 hours ------------------------------------------------------------------------------------------------------------------ No results found for this basename: TSH, T4TOTAL, FREET3, T3FREE, THYROIDAB,  in the last 72 hours ------------------------------------------------------------------------------------------------------------------ No results found for this basename: VITAMINB12, FOLATE, FERRITIN, TIBC, IRON, RETICCTPCT,  in the last 72 hours  Coagulation profile   Recent Labs Lab 05/28/13 1031 06/02/13 1105  INR 1.4 1.5      Assessment & Plan  Venous Thrombo-embolism in May 2014 - Although she had a leg sprain a few days prior to her  developing symptoms in May of 2014, I'm not sure whether this is a provoked venous thromboembolism. Her hypercoagulable workup is positive for  lupus anticoagulant, and decreased protein C levels. I spoke with Dr. Truett Perna with the oncology service, he suggested continuing Coumadin for now, repeating the hypercoagulable workup to see if her lupus antiplatelet is still positive. He suggested that the protein C we'll continue to be low because of Coumadin. I will continue patient on Coumadin, repeat a hypercoagulable workup and refer the patient to hematology oncology. Continue with Coumadin until seen by hematology/oncology- not sure if patient can come off without further expert opinion from hematology.  Furthermore, her INR levels are subtherapeutic, she just started taking 10 mg of Coumadin since yesterday (before that over the weekend she was on 7.5), continue with 10 mg of Coumadin and repeat INR this coming Thursday.  Followup for a quick visit on 11/21.  The patient was given clear instructions to go to ER or return to medical center if symptoms don't improve, worsen or new problems develop. The patient verbalized understanding. The patient was told to call to get lab results if they haven't heard anything in the next week.

## 2013-06-03 NOTE — Addendum Note (Signed)
Addended by: Maretta Bees on: 06/03/2013 12:01 PM   Modules accepted: Orders

## 2013-06-04 ENCOUNTER — Telehealth: Payer: Self-pay | Admitting: Internal Medicine

## 2013-06-04 NOTE — Telephone Encounter (Signed)
C/D 06/04/13 for appt. 06/13/13 °

## 2013-06-05 ENCOUNTER — Ambulatory Visit: Payer: Medicaid Other | Attending: Internal Medicine

## 2013-06-05 DIAGNOSIS — I82409 Acute embolism and thrombosis of unspecified deep veins of unspecified lower extremity: Secondary | ICD-10-CM

## 2013-06-05 DIAGNOSIS — Z7901 Long term (current) use of anticoagulants: Secondary | ICD-10-CM

## 2013-06-05 NOTE — Progress Notes (Unsigned)
Patient here for blood work and INR

## 2013-06-06 ENCOUNTER — Ambulatory Visit: Payer: Medicaid Other

## 2013-06-06 LAB — HYPERCOAGULABLE PANEL, COMPREHENSIVE
Anticardiolipin IgA: 6 APL U/mL (ref <22)
Beta-2-Glycoprotein I IgA: 0 A Units (ref <20)
Lupus Anticoagulant: NOT DETECTED
Protein C Activity: <15 % — ABNORMAL LOW (ref 75–133)
Protein C, Total: 46 % — ABNORMAL LOW (ref 72–160)
Protein S Activity: 46 % — ABNORMAL LOW (ref 69–129)
Protein S Total: 57 % — ABNORMAL LOW (ref 60–150)

## 2013-06-06 LAB — PROTIME-INR
INR: 1.71 — ABNORMAL HIGH (ref <1.50)
Prothrombin Time: 19.6 seconds — ABNORMAL HIGH (ref 11.6–15.2)

## 2013-06-06 LAB — D-DIMER, QUANTITATIVE: D-Dimer, Quant: 0.36 ug/mL-FEU (ref 0.00–0.48)

## 2013-06-09 ENCOUNTER — Ambulatory Visit: Payer: Medicaid Other | Attending: Internal Medicine

## 2013-06-09 ENCOUNTER — Other Ambulatory Visit: Payer: Medicaid Other

## 2013-06-09 VITALS — BP 135/83 | HR 93 | Temp 98.6°F | Resp 16 | Ht 69.0 in | Wt 275.0 lb

## 2013-06-09 DIAGNOSIS — Z7901 Long term (current) use of anticoagulants: Secondary | ICD-10-CM

## 2013-06-09 NOTE — Patient Instructions (Signed)
Make no changes to medication this week. Return in a week to recheck levels.

## 2013-06-09 NOTE — Progress Notes (Unsigned)
Pt is here today for labs only INR.

## 2013-06-13 ENCOUNTER — Encounter: Payer: Self-pay | Admitting: Internal Medicine

## 2013-06-13 ENCOUNTER — Ambulatory Visit: Payer: Medicaid Other

## 2013-06-13 ENCOUNTER — Other Ambulatory Visit: Payer: Self-pay | Admitting: Internal Medicine

## 2013-06-13 ENCOUNTER — Ambulatory Visit (HOSPITAL_BASED_OUTPATIENT_CLINIC_OR_DEPARTMENT_OTHER): Payer: Medicaid Other | Admitting: Internal Medicine

## 2013-06-13 ENCOUNTER — Telehealth: Payer: Self-pay | Admitting: Internal Medicine

## 2013-06-13 VITALS — BP 126/71 | HR 75 | Temp 98.1°F | Resp 20 | Ht 69.0 in | Wt 278.7 lb

## 2013-06-13 DIAGNOSIS — I2699 Other pulmonary embolism without acute cor pulmonale: Secondary | ICD-10-CM

## 2013-06-13 DIAGNOSIS — I82409 Acute embolism and thrombosis of unspecified deep veins of unspecified lower extremity: Secondary | ICD-10-CM

## 2013-06-13 NOTE — Telephone Encounter (Signed)
Gave pt apt for lab  before and MD for December and 12/16 is per MD

## 2013-06-13 NOTE — Progress Notes (Signed)
Checked in new patient with no financial issues. She has not been to [place] and she has her appt card. °

## 2013-06-16 ENCOUNTER — Other Ambulatory Visit: Payer: Medicaid Other

## 2013-06-16 NOTE — Progress Notes (Signed)
[facility] Telephone:([phone]) [phone]   Fax:([phone]) [phone]  NEW PATIENT EVALUATION   Name: Donna [name] Date: 06/16/2013 MRN: [id number] DOB: 02/15/1971  PCP: Jeanann Lewandowsky, MD   REFERRING PHYSICIAN: Jeanann Lewandowsky, MD  REASON FOR REFERRAL: DVT/PE     HISTORY OF PRESENT ILLNESS:Donna [name] is a 42 y.o. 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  Her DVT was thought to have been provoked by a leg spriain that happened a few days before her developing symptoms.  She had a hypercoagulable workup which was positive for lupus anticoagulant and decreased protein C levels.  She was discharge from the hospital on 5/28, and today she is inquiring whether she can come off anticoagulation.  She denies any prior DVTs or blood clots or family history of blood clots in the past.   She denies any melena or hematochezia or bleeding complications while on coumadin.  She has one child without a history of having miscarriages.  Due to recent subtherapeutic INR levels, she was started on 10 mg of coumadin on 06/03/2013.  Her INR on 05/28/2013 was 1.4 and on 06/02/2013 was 1.5.   She had repeat hypercoagulable workup on 06/05/2013 which revealed lupus anticoagulant was not detected.  She was negative for factor V mutation and prothrombin II gene mutation.  Her anticardiolipin were alo negative.  Antithrombin III levels were in normal range.      PAST MEDICAL HISTORY:  has a past medical history of Lung collapse and Pulmonary embolism, blood-clot, obstetric.     PAST SURGICAL HISTORY: Past Surgical History  Procedure Laterality Date  . Rib resection     CURRENT MEDICATIONS: has a current medication list which includes the following prescription(s): acetaminophen and warfarin.   ALLERGIES: Review of patient's allergies indicates no known allergies.   SOCIAL HISTORY:  reports that she has never smoked. She has never used smokeless tobacco. She  reports that she drinks alcohol. She reports that she does not use illicit drugs.   FAMILY HISTORY: family history includes COPD in her father; Stroke in her father.   LABORATORY DATA:  No results found for this or any previous visit (from the past 48 hour(s)).     RADIOGRAPHY: 12/05/2012 CT ANGIOGRAPHY CHEST  Technique: Multidetector CT imaging of the chest using the  standard protocol during bolus administration of intravenous  contrast. Multiplanar reconstructed images including MIPs were  obtained and reviewed to evaluate the vascular anatomy.  Contrast: OMNIPAQUE IOHEXOL 350 MG/ML SOLN  Comparison: CT chest 07/20/2012.  Findings: The chest wall is unremarkable and stable. No breast  masses, supraclavicular or axillary adenopathy. Small scattered  benign appearing fatty lymph nodes are noted.  The bony thorax is intact.  The heart is normal in size. No pericardial effusion. No  mediastinal or hilar lymphadenopathy. Small scattered lymph nodes  are noted. The esophagus is grossly normal. A small hiatal hernia  is noted. The aorta is normal in caliber. No dissection.  The pulmonary arterial tree is fairly well opacified. There are  bilateral pulmonary emboli.  Examination of the lung parenchyma demonstrates patchy subsegmental  bibasilar atelectasis. No worrisome mass lesions or  bronchiectasis.  The upper abdomen is unremarkable.  IMPRESSION:  1. Small bilateral pulmonary emboli.  2. Normal thoracic aorta.  3. Patchy bibasilar subsegmental atelectasis.   Venous Duplex Noninvasive Vascular Lab  Bilateral Lower Extremity Venous Duplex Evaluation  Patient: Donna, [name] MR #: [id number] Study Date: 12/05/2012  Gender: F Age: 40 Height: Weight: BSA: Pt. Status: Room: 1421  ATTENDING Oti, Lester Kaplan SONOGRAPHER [facility], RVT Reports also to:  ------------------------------------------------------------ History and  indications:  Indications  415.19 Other pulmonary embolism and infarction. History  Diagnostic evaluation. Pulmonary embolus.  ------------------------------------------------------------ Study information:  Study status: Routine. Procedure: A vascular evaluation was performed with the patient in the supine position. The right common femoral, right femoral, right profunda femoral, right popliteal, right peroneal, right posterior tibial, left common femoral, left femoral, left profunda femoral, left popliteal, left peroneal, and left posterior tibial veins were studied. Bilateral lower extremity venous duplex evaluation. Doppler flow study including B-mode compression maneuvers of all visualized segments, color flow Doppler and selected views of pulsed wave Doppler. Location: Bedside. Patient status: Inpatient.  Summary:  - Findings consistent with deep vein thrombosis involving the right posterial tibial vein and right peroneal vein. - No evidence of deep vein thrombosis involving the left lower extremity. Other specific details can be found in the table(s) above. Prepared and Electronically Authenticated by  REVIEW OF SYSTEMS:  Constitutional: Denies fevers, chills or abnormal weight loss Eyes: Denies blurriness of vision Ears, nose, mouth, throat, and face: Denies mucositis or sore throat Respiratory: Denies cough, dyspnea or wheezes Cardiovascular: Denies palpitation, chest discomfort or lower extremity swelling Gastrointestinal:  Denies nausea, heartburn or change in bowel habits Skin: Denies abnormal skin rashes Lymphatics: Denies new lymphadenopathy or easy bruising Neurological:Denies numbness, tingling or new weaknesses Behavioral/Psych: Mood is stable, no new changes  All other systems were reviewed with the patient and are negative.  PHYSICAL EXAM:  height is 5\' 9"  (1.753 m) and weight is 278 lb 11.2 oz (126.417 kg). Her oral temperature is 98.1 F (36.7 C).  Her blood pressure is 126/71 and her pulse is 75. Her respiration is 20.   GENERAL:alert, no distress and comfortable; Moderately obese.  SKIN: skin color, texture, turgor are normal, no rashes or significant lesions EYES: normal, Conjunctiva are pink and non-injected, sclera clear OROPHARYNX:no exudate, no erythema and lips, buccal mucosa, and tongue normal  NECK: supple, thyroid normal size, non-tender, without nodularity LYMPH:  no palpable lymphadenopathy in the cervical, axillary or inguinal LUNGS: clear to auscultation and percussion with normal breathing effort HEART: regular rate & rhythm and no murmurs and no lower extremity edema ABDOMEN:abdomen soft, non-tender and normal bowel sounds Musculoskeletal:no cyanosis of digits and no clubbing  NEURO: alert & oriented x 3 with fluent speech, no focal motor/sensory deficits   IMPRESSION: MATTHEW PAIS is a 42 y.o. female with a history of   PLAN:  1.  RLE DVT/PE.  --Questionably provoked by a leg sprain that she had a few days before developing symptoms. --She has been on anticoagulation since May 22nd, 2014. --Her PTT Lupus anticoagulant was 89.1 on 12/05/2012 and decreased Protein C levels.  Her reepat lupus anticoagulant is negative without detection of a lupus anticoagulant.  In the setting of being on coumadin we can not intepret the protein c levels.  We will repeat labs and a few weeks off coumadin.   --We counseled her to return for further evaluation and to report any symptoms of unilateral leg swelling or pain or acute dyspnea.   2. Obesity. -- Continued dieting and exercises per her PCP.   3. Follow-up. --Follow in 3 weeks with labs in 2 weeks repeating her hypercoagulable work-up.  If negative, follow-up prn.   All questions were answered. The patient knows to call the clinic with  any problems, questions or concerns. We can certainly see the patient much sooner if necessary.  I spent 30 minutes counseling the  patient face to face. The total time spent in the appointment was 45 minutes.    Amrita Radu, MD 06/16/2013 8:38 AM

## 2013-06-17 ENCOUNTER — Other Ambulatory Visit: Payer: Self-pay | Admitting: Emergency Medicine

## 2013-06-17 MED ORDER — WARFARIN SODIUM 10 MG PO TABS: 10.0000 mg | ORAL_TABLET | Freq: Every day | ORAL | Status: DC

## 2013-06-25 ENCOUNTER — Other Ambulatory Visit: Payer: Medicaid Other

## 2013-06-25 ENCOUNTER — Other Ambulatory Visit (HOSPITAL_BASED_OUTPATIENT_CLINIC_OR_DEPARTMENT_OTHER): Payer: Medicaid Other

## 2013-06-25 DIAGNOSIS — I82409 Acute embolism and thrombosis of unspecified deep veins of unspecified lower extremity: Secondary | ICD-10-CM

## 2013-06-25 DIAGNOSIS — I2699 Other pulmonary embolism without acute cor pulmonale: Secondary | ICD-10-CM

## 2013-06-25 LAB — COMPREHENSIVE METABOLIC PANEL (CC13)
ALT: 16 U/L (ref 0–55)
AST: 13 U/L (ref 5–34)
Alkaline Phosphatase: 63 U/L (ref 40–150)
BUN: 11 mg/dL (ref 7.0–26.0)
Creatinine: 0.9 mg/dL (ref 0.6–1.1)
Sodium: 137 mEq/L (ref 136–145)
Total Bilirubin: 0.35 mg/dL (ref 0.20–1.20)
Total Protein: 6.7 g/dL (ref 6.4–8.3)

## 2013-06-25 LAB — CBC WITH DIFFERENTIAL/PLATELET
BASO%: 0.5 % (ref 0.0–2.0)
Basophils Absolute: 0 10*3/uL (ref 0.0–0.1)
EOS%: 1.4 % (ref 0.0–7.0)
HCT: 35.3 % (ref 34.8–46.6)
HGB: 11.5 g/dL — ABNORMAL LOW (ref 11.6–15.9)
LYMPH%: 30.2 % (ref 14.0–49.7)
MCH: 26.1 pg (ref 25.1–34.0)
MCHC: 32.6 g/dL (ref 31.5–36.0)
MONO%: 10.2 % (ref 0.0–14.0)
NEUT%: 57.7 % (ref 38.4–76.8)
Platelets: 257 10*3/uL (ref 145–400)

## 2013-06-27 LAB — HYPERCOAGULABLE PANEL, COMPREHENSIVE
Anticardiolipin IgG: 16 GPL U/mL (ref <23)
Anticardiolipin IgM: 8 MPL U/mL (ref <11)
Beta-2 Glyco I IgG: 2 G Units (ref <20)
DRVVT: 35.6 secs (ref <42.9)
Lupus Anticoagulant: NOT DETECTED
PTT Lupus Anticoagulant: 29.3 secs (ref 28.0–43.0)
Protein C Activity: 105 % (ref 75–133)
Protein S Activity: 103 % (ref 69–129)

## 2013-07-01 ENCOUNTER — Ambulatory Visit: Payer: Medicaid Other

## 2013-07-01 ENCOUNTER — Other Ambulatory Visit: Payer: Medicaid Other | Admitting: Lab

## 2013-07-22 ENCOUNTER — Emergency Department (HOSPITAL_COMMUNITY)
Admission: EM | Admit: 2013-07-22 | Discharge: 2013-07-23 | Disposition: A | Discharge status: Home / Self Care | Payer: Medicaid Other | Attending: Emergency Medicine | Admitting: Emergency Medicine

## 2013-07-22 ENCOUNTER — Encounter (HOSPITAL_COMMUNITY): Payer: Self-pay | Admitting: Emergency Medicine

## 2013-07-22 DIAGNOSIS — J029 Acute pharyngitis, unspecified: Secondary | ICD-10-CM | POA: Insufficient documentation

## 2013-07-22 DIAGNOSIS — Y929 Unspecified place or not applicable: Secondary | ICD-10-CM | POA: Insufficient documentation

## 2013-07-22 DIAGNOSIS — Y939 Activity, unspecified: Secondary | ICD-10-CM | POA: Insufficient documentation

## 2013-07-22 DIAGNOSIS — W1809XA Striking against other object with subsequent fall, initial encounter: Secondary | ICD-10-CM | POA: Insufficient documentation

## 2013-07-22 DIAGNOSIS — Z8709 Personal history of other diseases of the respiratory system: Secondary | ICD-10-CM | POA: Insufficient documentation

## 2013-07-22 DIAGNOSIS — S8010XA Contusion of unspecified lower leg, initial encounter: Secondary | ICD-10-CM | POA: Insufficient documentation

## 2013-07-22 DIAGNOSIS — F411 Generalized anxiety disorder: Secondary | ICD-10-CM | POA: Insufficient documentation

## 2013-07-22 DIAGNOSIS — S8011XA Contusion of right lower leg, initial encounter: Secondary | ICD-10-CM

## 2013-07-22 DIAGNOSIS — Z86711 Personal history of pulmonary embolism: Secondary | ICD-10-CM | POA: Insufficient documentation

## 2013-07-22 NOTE — ED Notes (Addendum)
Pt from c/o right ankle since Friday from a fall. Swelling noted. She also c/o a sore throat in which it hurts to swallow. Hx of blood clot in right leg and hx of PE's in May. She reports just finishing her blood thinners.

## 2013-07-23 ENCOUNTER — Encounter (HOSPITAL_COMMUNITY): Payer: Self-pay

## 2013-07-23 ENCOUNTER — Emergency Department (HOSPITAL_COMMUNITY): Payer: Medicaid Other

## 2013-07-23 LAB — D-DIMER, QUANTITATIVE (NOT AT ARMC): D DIMER QUANT: 0.69 ug{FEU}/mL — AB (ref 0.00–0.48)

## 2013-07-23 LAB — POCT I-STAT, CHEM 8
BUN: 14 mg/dL (ref 6–23)
CALCIUM ION: 1.27 mmol/L — AB (ref 1.12–1.23)
CREATININE: 1 mg/dL (ref 0.50–1.10)
Chloride: 105 mEq/L (ref 96–112)
GLUCOSE: 105 mg/dL — AB (ref 70–99)
HCT: 37 % (ref 36.0–46.0)
Hemoglobin: 12.6 g/dL (ref 12.0–15.0)
Potassium: 4.1 mEq/L (ref 3.7–5.3)
Sodium: 139 mEq/L (ref 137–147)
TCO2: 24 mmol/L (ref 0–100)

## 2013-07-23 LAB — RAPID STREP SCREEN (MED CTR MEBANE ONLY): Streptococcus, Group A Screen (Direct): NEGATIVE

## 2013-07-23 MED ORDER — SODIUM CHLORIDE 0.9 % IV SOLN: Freq: Once | INTRAVENOUS | Status: DC

## 2013-07-23 MED ORDER — IOHEXOL 350 MG/ML SOLN
100.0000 mL | Freq: Once | INTRAVENOUS | Status: AC | PRN
  Administered 2013-07-23: 100 mL via INTRAVENOUS

## 2013-07-23 NOTE — ED Provider Notes (Signed)
Medical screening examination/treatment/procedure(s) were performed by non-physician practitioner and as supervising physician I was immediately available for consultation/collaboration.  EKG Interpretation   None        Merryl Hacker, MD 07/23/13 [phone]

## 2013-07-23 NOTE — Discharge Instructions (Signed)
Hematoma A hematoma is a collection of blood under the skin, in an organ, in a body space, in a joint space, or in other tissue. The blood can clot to form a lump that you can see and feel. The lump is often firm and may sometimes become sore and tender. Most hematomas get better in a few days to weeks. However, some hematomas may be serious and require medical care. Hematomas can range in size from very small to very large. CAUSES  A hematoma can be caused by a blunt or penetrating injury. It can also be caused by spontaneous leakage from a blood vessel under the skin. Spontaneous leakage from a blood vessel is more likely to occur in older people, especially those taking blood thinners. Sometimes, a hematoma can develop after certain medical procedures. SIGNS AND SYMPTOMS   A firm lump on the body.  Possible pain and tenderness in the area.  Bruising.Blue, dark blue, purple-red, or yellowish skin may appear at the site of the hematoma if the hematoma is close to the surface of the skin. For hematomas in deeper tissues or body spaces, the signs and symptoms may be subtle. For example, an intra-abdominal hematoma may cause abdominal pain, weakness, fainting, and shortness of breath. An intracranial hematoma may cause a headache or symptoms such as weakness, trouble speaking, or a change in consciousness. DIAGNOSIS  A hematoma can usually be diagnosed based on your medical history and a physical exam. Imaging tests may be needed if your health care provider suspects a hematoma in deeper tissues or body spaces, such as the abdomen, head, or chest. These tests may include ultrasonography or a CT scan.  TREATMENT  Hematomas usually go away on their own over time. Rarely does the blood need to be drained out of the body. Large hematomas or those that may affect vital organs will sometimes need surgical drainage or monitoring. HOME CARE INSTRUCTIONS   Apply ice to the injured area:   Put ice in a  plastic bag.   Place a towel between your skin and the bag.   Leave the ice on for 20 minutes, 2 3 times a day for the first 1 to 2 days.   After the first 2 days, switch to using warm compresses on the hematoma.   Elevate the injured area to help decrease pain and swelling. Wrapping the area with an elastic bandage may also be helpful. Compression helps to reduce swelling and promotes shrinking of the hematoma. Make sure the bandage is not wrapped too tight.   If your hematoma is on a lower extremity and is painful, crutches may be helpful for a couple days.   Only take over-the-counter or prescription medicines as directed by your health care provider. SEEK IMMEDIATE MEDICAL CARE IF:   You have increasing pain, or your pain is not controlled with medicine.   You have a fever.   You have worsening swelling or discoloration.   Your skin over the hematoma breaks or starts bleeding.   Your hematoma is in your chest or abdomen and you have weakness, shortness of breath, or a change in consciousness.  Your hematoma is on your scalp (caused by a fall or injury) and you have a worsening headache or a change in alertness or consciousness. MAKE SURE YOU:   Understand these instructions.  Will watch your condition.  Will get help right away if you are not doing well or get worse. Document Released: 02/15/2004 Document Revised: 03/05/2013 Document Reviewed:  12/11/2012 ExitCare Patient Information 2014 [place]. Your CT is normal

## 2013-07-23 NOTE — ED Provider Notes (Signed)
CSN: [id number]     Arrival date & time 07/22/13  2238 History   First MD Initiated Contact with Patient 07/23/13 0007     Chief Complaint  Patient presents with  . Ankle Pain  . Sore Throat   (Consider location/radiation/quality/duration/timing/severity/associated sxs/prior Treatment) HPI Comments: Patient states she fell on Friday 3 days ago, striking her right shin.  She noticed some swelling of been painful since, then.  She's not taken any medication tried ice heat, elevation or compression, states it's been persistently painful, but improving.  She, states, that she is, concerned for a "blood clot" because she has a history of same.  Last May when she had a sprained ankle.  She finished Coumadin approximately 3 months ago. She also states, that she has some discomfort in her throat when she swallows.  She has a pulling sensation that travels down her esophagus to mid, chest.  Denies any nausea, vomiting, fever, cough  Patient is a 43 y.o. female presenting with ankle pain and pharyngitis. The history is provided by the patient.  Ankle Pain Location:  Leg Time since incident:  3 days Injury: yes   Mechanism of injury: fall   Fall:    Fall occurred:  Walking   Point of impact:  Unable to specify   Entrapped after fall: no   Leg location:  R lower leg Pain details:    Quality:  Aching   Radiates to:  Does not radiate   Severity:  Mild   Onset quality:  Sudden   Duration:  3 days   Timing:  Constant   Progression:  Improving Chronicity:  New Dislocation: no   Prior injury to area:  Yes Relieved by:  None tried Worsened by:  Nothing tried Associated symptoms: swelling   Associated symptoms: no decreased ROM, no fever and no itching   Risk factors: obesity   Sore Throat Associated symptoms include arthralgias and a sore throat. Pertinent negatives include no fever or joint swelling.    Past Medical History  Diagnosis Date  . Lung collapse   . Pulmonary embolism,  blood-clot, obstetric    Past Surgical History  Procedure Laterality Date  . Rib resection     Family History  Problem Relation Age of Onset  . COPD Father   . Stroke Father    History  Substance Use Topics  . Smoking status: Never Smoker   . Smokeless tobacco: Never Used  . Alcohol Use: Yes     Comment: socially    OB History   Grav Para Term Preterm Abortions TAB SAB Ect Mult Living   1 1 1  0 0 0 0 0 0 1     Review of Systems  Constitutional: Negative for fever.  HENT: Positive for sore throat.   Respiratory: Negative for shortness of breath and wheezing.   Musculoskeletal: Positive for arthralgias. Negative for joint swelling.  Skin: Negative for itching and wound.  Psychiatric/Behavioral: The patient is nervous/anxious.   All other systems reviewed and are negative.    Allergies  Review of patient's allergies indicates no known allergies.  Home Medications   Current Outpatient Rx  Name  Route  Sig  Dispense  Refill  . acetaminophen (TYLENOL) 500 MG tablet   Oral   Take 500 mg by mouth every 6 (six) hours as needed for pain.          BP 138/62  Pulse 89  Temp(Src) 98.1 F (36.7 C) (Oral)  Resp 18  Ht  5\' 10"  (1.778 m)  Wt 250 lb (113.399 kg)  BMI 35.87 kg/m2  SpO2 100%  LMP 07/04/2013 Physical Exam  Nursing note and vitals reviewed. Constitutional: She appears well-developed and well-nourished.  Morbidly obese  HENT:  Right Ear: External ear normal.  Left Ear: External ear normal.  Mouth/Throat: Oropharynx is clear and moist. No oropharyngeal exudate.  Eyes: Pupils are equal, round, and reactive to light.  Neck: Normal range of motion.  Cardiovascular: Normal rate.   Pulmonary/Chest: Effort normal.  Abdominal: Soft. She exhibits no distension. There is no tenderness.  Musculoskeletal: Normal range of motion. She exhibits tenderness. She exhibits no edema.       Legs: Lymphadenopathy:    She has no cervical adenopathy.  Neurological: She is  alert.  Skin: Skin is warm. No erythema.    ED Course  Procedures (including critical care time) Labs Review Labs Reviewed  D-DIMER, QUANTITATIVE - Abnormal; Notable for the following:    D-Dimer, Quant 0.69 (*)    All other components within normal limits  POCT I-STAT, CHEM 8 - Abnormal; Notable for the following:    Glucose, Bld 105 (*)    Calcium, Ion 1.27 (*)    All other components within normal limits  RAPID STREP SCREEN  CULTURE, GROUP A STREP   Imaging Review Dg Tibia/fibula Right  07/23/2013   CLINICAL DATA:  Fall January 2nd with distal anterior tibia and fibula pain.  EXAM: RIGHT TIBIA AND FIBULA - 2 VIEW  COMPARISON:  Right ankle radiograph Dec 04, 2012.  FINDINGS: No acute fracture deformity or dislocation. Joint space intact without erosions. No destructive bony lesions. Soft tissue planes are not suspicious.  Tiny bony fragment projects over the lateral tibial spine on the frontal view of the knee, not localized on the lateral radiograph. Small calcaneal spur.  IMPRESSION: No acute fracture deformity or dislocation.  Tiny bony fragment projects within the knee, seen only on frontal radiograph. If there are referable symptoms, consider dedicated knee radiograph.   Electronically Signed   By: Elon Alas   On: 07/23/2013 00:44   Ct Angio Chest Pe W/cm &/or Wo Cm  07/23/2013   CLINICAL DATA:  Chest pain and history of pulmonary embolism  EXAM: CT ANGIOGRAPHY CHEST WITH CONTRAST  TECHNIQUE: Multidetector CT imaging of the chest was performed using the standard protocol during bolus administration of intravenous contrast. Multiplanar CT image reconstructions including MIPs were obtained to evaluate the vascular anatomy.  CONTRAST:  154mL OMNIPAQUE IOHEXOL 350 MG/ML SOLN  COMPARISON:  12/05/2012  FINDINGS: THORACIC INLET/BODY WALL:  No acute abnormality.  MEDIASTINUM:  Normal heart size. No pericardial effusion. No acute vascular abnormality, including pulmonary embolism.  Previously seen emboli are no longer visible. Negative for aortic aneurysm or dissection. No adenopathy. Tiny sliding-type hiatal hernia.  LUNG WINDOWS:  No consolidation.  No effusion.  No suspicious pulmonary nodule.  UPPER ABDOMEN:  No acute findings.  OSSEOUS:  No acute fracture. No suspicious lytic or blastic lesions. 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  Review of the MIP images confirms the above findings.  IMPRESSION: Negative for pulmonary embolism or other acute intrathoracic abnormality.   Electronically Signed   By: Jorje Guild M.D.   On: 07/23/2013 04:24    EKG Interpretation   None       MDM   1. Traumatic hematoma of lower leg, right, initial encounter     Ordered D Dimer due to patients Hx  Garald Balding, NP 07/23/13 0440  Garald Balding, NP 07/23/13 [phone]

## 2013-07-25 LAB — CULTURE, GROUP A STREP

## 2013-08-31 ENCOUNTER — Emergency Department (HOSPITAL_COMMUNITY)
Admission: EM | Admit: 2013-08-31 | Discharge: 2013-08-31 | Disposition: A | Discharge status: Home / Self Care | Payer: Medicaid Other | Attending: Emergency Medicine | Admitting: Emergency Medicine

## 2013-08-31 ENCOUNTER — Encounter (HOSPITAL_COMMUNITY): Payer: Self-pay | Admitting: Emergency Medicine

## 2013-08-31 DIAGNOSIS — R300 Dysuria: Secondary | ICD-10-CM | POA: Insufficient documentation

## 2013-08-31 DIAGNOSIS — R350 Frequency of micturition: Secondary | ICD-10-CM | POA: Insufficient documentation

## 2013-08-31 DIAGNOSIS — Z8709 Personal history of other diseases of the respiratory system: Secondary | ICD-10-CM | POA: Insufficient documentation

## 2013-08-31 DIAGNOSIS — R8281 Pyuria: Secondary | ICD-10-CM

## 2013-08-31 DIAGNOSIS — R82998 Other abnormal findings in urine: Secondary | ICD-10-CM | POA: Insufficient documentation

## 2013-08-31 DIAGNOSIS — R3915 Urgency of urination: Secondary | ICD-10-CM | POA: Insufficient documentation

## 2013-08-31 DIAGNOSIS — Z3202 Encounter for pregnancy test, result negative: Secondary | ICD-10-CM | POA: Insufficient documentation

## 2013-08-31 LAB — URINALYSIS, ROUTINE W REFLEX MICROSCOPIC
Bilirubin Urine: NEGATIVE
GLUCOSE, UA: NEGATIVE mg/dL
HGB URINE DIPSTICK: NEGATIVE
KETONES UR: NEGATIVE mg/dL
Nitrite: NEGATIVE
Protein, ur: NEGATIVE mg/dL
Specific Gravity, Urine: 1.02 (ref 1.005–1.030)
Urobilinogen, UA: 0.2 mg/dL (ref 0.0–1.0)
pH: 6.5 (ref 5.0–8.0)

## 2013-08-31 LAB — URINE MICROSCOPIC-ADD ON

## 2013-08-31 LAB — POCT PREGNANCY, URINE: Preg Test, Ur: NEGATIVE

## 2013-08-31 MED ORDER — PHENAZOPYRIDINE HCL 200 MG PO TABS: 200.0000 mg | ORAL_TABLET | Freq: Three times a day (TID) | ORAL | Status: DC

## 2013-08-31 MED ORDER — CIPROFLOXACIN HCL 500 MG PO TABS: 500.0000 mg | ORAL_TABLET | Freq: Two times a day (BID) | ORAL | Status: DC

## 2013-08-31 NOTE — ED Provider Notes (Signed)
CSN: [id number]     Arrival date & time 08/31/13  1415 History   First MD Initiated Contact with Patient 08/31/13 1503     Chief Complaint  Patient presents with  . Dysuria     (Consider location/radiation/quality/duration/timing/severity/associated sxs/prior Treatment) HPI Comments: Patient is a 43 yo F presenting to the ED for five days of dysuria with associated urinary urgency, frequency, and suprapubic discomfort. Patient states she took an over the OTC UTI treatment medication (cannot recall the name) which has helped improved her dysuria, but she states she continues to have her symptoms. She denies any aggravating factors. She states she has had previous UTIs in the past. Patient denies any fevers, chills, nausea, vomiting, diarrhea, vaginal discharge, recent sexual intercourse. Patient is currently at the end of her menstrual cycle.    Past Medical History  Diagnosis Date  . Lung collapse   . Pulmonary embolism, blood-clot, obstetric    Past Surgical History  Procedure Laterality Date  . Rib resection     Family History  Problem Relation Age of Onset  . COPD Father   . Stroke Father    History  Substance Use Topics  . Smoking status: Never Smoker   . Smokeless tobacco: Never Used  . Alcohol Use: Yes     Comment: socially    OB History   Grav Para Term Preterm Abortions TAB SAB Ect Mult Living   1 1 1  0 0 0 0 0 0 1     Review of Systems  Constitutional: Negative for fever and chills.  Genitourinary: Positive for dysuria, urgency, frequency and decreased urine volume. Negative for flank pain, vaginal discharge and vaginal pain.  All other systems reviewed and are negative.      Allergies  Review of patient's allergies indicates no known allergies.  Home Medications   Current Outpatient Rx  Name  Route  Sig  Dispense  Refill  . Aspirin-Caffeine (BC FAST PAIN RELIEF PO)   Oral   Take 1 packet by mouth every 4 (four) hours as needed (pain).         .  ciprofloxacin (CIPRO) 500 MG tablet   Oral   Take 1 tablet (500 mg total) by mouth 2 (two) times daily.   6 tablet   0   . phenazopyridine (PYRIDIUM) 200 MG tablet   Oral   Take 1 tablet (200 mg total) by mouth 3 (three) times daily.   6 tablet   0    BP 122/79  Pulse 86  Temp(Src) 97.5 F (36.4 C) (Oral)  Resp 20  SpO2 100%  LMP 08/31/2013 Physical Exam  Constitutional: She is oriented to person, place, and time. She appears well-developed and well-nourished. No distress.  HENT:  Head: Normocephalic and atraumatic.  Right Ear: External ear normal.  Left Ear: External ear normal.  Nose: Nose normal.  Mouth/Throat: Oropharynx is clear and moist.  Eyes: Conjunctivae are normal.  Neck: Normal range of motion. Neck supple.  Cardiovascular: Normal rate, regular rhythm and normal heart sounds.   Pulmonary/Chest: Effort normal and breath sounds normal. No respiratory distress.  Abdominal: Soft. Bowel sounds are normal. There is no tenderness. There is no rigidity, no rebound, no guarding and no CVA tenderness.  Musculoskeletal: Normal range of motion.  Neurological: She is alert and oriented to person, place, and time.  Skin: Skin is warm and dry. She is not diaphoretic.  Psychiatric: She has a normal mood and affect.    ED Course  Procedures (including critical care time) Labs Review Labs Reviewed  URINALYSIS, ROUTINE W REFLEX MICROSCOPIC - Abnormal; Notable for the following:    Leukocytes, UA TRACE (*)    All other components within normal limits  URINE MICROSCOPIC-ADD ON  POCT PREGNANCY, URINE   Imaging Review No results found.  EKG Interpretation   None       MDM   Final diagnoses:  Pyuria  Dysuria    Filed Vitals:   08/31/13 1421  BP: 122/79  Pulse: 86  Temp: 97.5 F (36.4 C)  Resp: 20    Afebrile, NAD, non-toxic appearing, AAOx4.   Pt with pyuria and urinary symptoms. Pt is afebrile, no CVA tenderness, normotensive, and denies N/V. Pt to be  dc home with antibiotics and instructions to follow up with PCP if symptoms persist. Return precautions discussed. Patient is agreeable to plan. Patient is stable at time of discharge      Harlow Mares, PA-C 09/01/13 0020

## 2013-08-31 NOTE — ED Notes (Signed)
Patient reports dysuria and increased frequency with urination. Patient reports onset 08/26/13. States she used AZO OTC without complete relief but does report an improvement in pain.  Denies nausea/vomiting.

## 2013-08-31 NOTE — Discharge Instructions (Signed)
Please follow up with your primary care physician in 1-2 days. If you do not have one please call the [facility] number listed above. Please take your antibiotic until completion. Please use Pyridium to help with urinary symptoms. Please read all discharge instructions and return precautions.   Urinary Tract Infection Urinary tract infections (UTIs) can develop anywhere along your urinary tract. Your urinary tract is your body's drainage system for removing wastes and extra water. Your urinary tract includes two kidneys, two ureters, a bladder, and a urethra. Your kidneys are a pair of bean-shaped organs. Each kidney is about the size of your fist. They are located below your ribs, one on each side of your spine. CAUSES Infections are caused by microbes, which are microscopic organisms, including fungi, viruses, and bacteria. These organisms are so small that they can only be seen through a microscope. Bacteria are the microbes that most commonly cause UTIs. SYMPTOMS  Symptoms of UTIs may vary by age and gender of the patient and by the location of the infection. Symptoms in young women typically include a frequent and intense urge to urinate and a painful, burning feeling in the bladder or urethra during urination. Older women and men are more likely to be tired, shaky, and weak and have muscle aches and abdominal pain. A fever may mean the infection is in your kidneys. Other symptoms of a kidney infection include pain in your back or sides below the ribs, nausea, and vomiting. DIAGNOSIS To diagnose a UTI, your caregiver will ask you about your symptoms. Your caregiver also will ask to provide a urine sample. The urine sample will be tested for bacteria and white blood cells. White blood cells are made by your body to help fight infection. TREATMENT  Typically, UTIs can be treated with medication. Because most UTIs are caused by a bacterial infection, they usually can be treated with  the use of antibiotics. The choice of antibiotic and length of treatment depend on your symptoms and the type of bacteria causing your infection. HOME CARE INSTRUCTIONS  If you were prescribed antibiotics, take them exactly as your caregiver instructs you. Finish the medication even if you feel better after you have only taken some of the medication.  Drink enough water and fluids to keep your urine clear or pale yellow.  Avoid caffeine, tea, and carbonated beverages. They tend to irritate your bladder.  Empty your bladder often. Avoid holding urine for long periods of time.  Empty your bladder before and after sexual intercourse.  After a bowel movement, women should cleanse from front to back. Use each tissue only once. SEEK MEDICAL CARE IF:   You have back pain.  You develop a fever.  Your symptoms do not begin to resolve within 3 days. SEEK IMMEDIATE MEDICAL CARE IF:   You have severe back pain or lower abdominal pain.  You develop chills.  You have nausea or vomiting.  You have continued burning or discomfort with urination. MAKE SURE YOU:   Understand these instructions.  Will watch your condition.  Will get help right away if you are not doing well or get worse. Document Released: 04/12/2005 Document Revised: 01/02/2012 Document Reviewed: 08/11/2011 [facility] Patient Information 2014 [place].

## 2013-08-31 NOTE — ED Notes (Signed)
Patient reports that she has been having dysuria x 5 days. Patient has been taking OTC meds.

## 2013-09-02 NOTE — ED Provider Notes (Signed)
Medical screening examination/treatment/procedure(s) were performed by non-physician practitioner and as supervising physician I was immediately available for consultation/collaboration.  EKG Interpretation   None         Blanchie Dessert, MD 09/02/13 [phone]

## 2013-09-18 ENCOUNTER — Encounter (HOSPITAL_COMMUNITY): Payer: Self-pay | Admitting: Emergency Medicine

## 2013-09-18 ENCOUNTER — Emergency Department (HOSPITAL_COMMUNITY)
Admission: EM | Admit: 2013-09-18 | Discharge: 2013-09-18 | Disposition: A | Discharge status: Home / Self Care | Payer: Medicaid Other | Attending: Emergency Medicine | Admitting: Emergency Medicine

## 2013-09-18 DIAGNOSIS — M722 Plantar fascial fibromatosis: Secondary | ICD-10-CM | POA: Insufficient documentation

## 2013-09-18 DIAGNOSIS — Z8709 Personal history of other diseases of the respiratory system: Secondary | ICD-10-CM | POA: Insufficient documentation

## 2013-09-18 MED ORDER — IBUPROFEN 200 MG PO TABS
600.0000 mg | ORAL_TABLET | Freq: Once | ORAL | Status: AC
  Administered 2013-09-18: 600 mg via ORAL
  Filled 2013-09-18: qty 3

## 2013-09-18 NOTE — Discharge Instructions (Signed)
Plantar Fasciitis Plantar fasciitis is a common condition that causes foot pain. It is soreness (inflammation) of the band of tough fibrous tissue on the bottom of the foot that runs from the heel bone (calcaneus) to the ball of the foot. The cause of this soreness may be from excessive standing, poor fitting shoes, running on hard surfaces, being overweight, having an abnormal walk, or overuse (this is common in runners) of the painful foot or feet. It is also common in aerobic exercise dancers and ballet dancers. SYMPTOMS  Most people with plantar fasciitis complain of:  Severe pain in the morning on the bottom of their foot especially when taking the first steps out of bed. This pain recedes after a few minutes of walking.  Severe pain is experienced also during walking following a long period of inactivity.  Pain is worse when walking barefoot or up stairs DIAGNOSIS   Your caregiver will diagnose this condition by examining and feeling your foot.  Special tests such as X-rays of your foot, are usually not needed. PREVENTION   Consult a sports medicine professional before beginning a new exercise program.  Walking programs offer a good workout. With walking there is a lower chance of overuse injuries common to runners. There is less impact and less jarring of the joints.  Begin all new exercise programs slowly. If problems or pain develop, decrease the amount of time or distance until you are at a comfortable level.  Wear good shoes and replace them regularly.  Stretch your foot and the heel cords at the back of the ankle (Achilles tendon) both before and after exercise.  Run or exercise on even surfaces that are not hard. For example, asphalt is better than pavement.  Do not run barefoot on hard surfaces.  If using a treadmill, vary the incline.  Do not continue to workout if you have foot or joint problems. Seek professional help if they do not improve. HOME CARE INSTRUCTIONS     Avoid activities that cause you pain until you recover.  Use ice or cold packs on the problem or painful areas after working out.  Only take over-the-counter or prescription medicines for pain, discomfort, or fever as directed by your caregiver.  Soft shoe inserts or athletic shoes with air or gel sole cushions may be helpful.  If problems continue or become more severe, consult a sports medicine caregiver or your own health care provider. Cortisone is a potent anti-inflammatory medication that may be injected into the painful area. You can discuss this treatment with your caregiver. MAKE SURE YOU:   Understand these instructions.  Will watch your condition.  Will get help right away if you are not doing well or get worse. Document Released: 03/28/2001 Document Revised: 09/25/2011 Document Reviewed: 05/27/2008 [facility] Patient Information 2014 [place], [place].    Ibuprofen for pain or discomfort Elevate foot and use ice pack Use gel inserts in shoes or wear shoes with good arch support

## 2013-09-18 NOTE — ED Notes (Signed)
Per pt, states right foot pain since yesterday-no injury-states pain when foot is arched

## 2013-09-18 NOTE — ED Provider Notes (Signed)
CSN: [id number]     Arrival date & time 09/18/13  1506 History   This chart was scribed for non-physician practitioner  Elisha Headland, NP, working with Tanna Furry, MD, by Neta Ehlers, ED Scribe. This patient was seen in room WTR5/WTR5 and the patient's care was started at 5:50 PM. None    Chief Complaint  Patient presents with  . Foot Pain    The history is provided by the patient. No language interpreter was used.   HPI Comments: Donna [name] is a 43 y.o. female who presents to the Emergency Department complaining of pain to the bottom of her right foot which began yesterday morning and improved throughout the day. She characterizes the pain as "sharp," and she reports the pain is increased with ambulation and flexion. She denies any recent injuries or falls. She has used ice to mitigate the pain with moderate resolution.   Past Medical History  Diagnosis Date  . Lung collapse   . Pulmonary embolism, blood-clot, obstetric    Past Surgical History  Procedure Laterality Date  . Rib resection     Family History  Problem Relation Age of Onset  . COPD Father   . Stroke Father    History  Substance Use Topics  . Smoking status: Never Smoker   . Smokeless tobacco: Never Used  . Alcohol Use: Yes     Comment: socially    OB History   Grav Para Term Preterm Abortions TAB SAB Ect Mult Living   1 1 1  0 0 0 0 0 0 1     Review of Systems  Constitutional: Negative for fever.  Musculoskeletal: Positive for myalgias.  All other systems reviewed and are negative.   Allergies  Review of patient's allergies indicates no known allergies.  Home Medications   Current Outpatient Rx  Name  Route  Sig  Dispense  Refill  . aspirin-acetaminophen-caffeine (EXCEDRIN EXTRA STRENGTH) 250-250-65 MG per tablet   Oral   Take 2 tablets by mouth every 6 (six) hours as needed for headache.         . Aspirin-Caffeine (BC FAST PAIN RELIEF PO)   Oral   Take 1 packet by mouth every 4 (four)  hours as needed (pain).          Triage Vitals: BP 114/68  Pulse 84  Temp(Src) 98.2 F (36.8 C) (Oral)  Resp 20  Ht 5\' 9"  (1.753 m)  Wt 250 lb (113.399 kg)  BMI 36.90 kg/m2  SpO2 100%  LMP 08/31/2013  Physical Exam  Nursing note and vitals reviewed. Constitutional: She is oriented to person, place, and time. She appears well-developed and well-nourished. No distress.  HENT:  Head: Normocephalic and atraumatic.  Eyes: EOM are normal. Right eye exhibits no discharge. Left eye exhibits no discharge.  Neck: Neck supple. No tracheal deviation present.  Cardiovascular: Normal rate, regular rhythm and normal heart sounds.   No murmur heard. Pulmonary/Chest: Effort normal and breath sounds normal. No respiratory distress. She has no wheezes.  Musculoskeletal: Normal range of motion. She exhibits tenderness.  Tenderness on dorsal aspect of right foot.   Neurological: She is alert and oriented to person, place, and time.  Skin: Skin is warm and dry.  Psychiatric: She has a normal mood and affect. Her behavior is normal.    ED Course  Procedures (including critical care time)  DIAGNOSTIC STUDIES: Oxygen Saturation is 100% on room air, normal by my interpretation.    COORDINATION OF CARE:  5:53  PM- Discussed treatment plan with patient, which includes using IBU and continual icing, and the patient agreed to the plan. Also advised pt to purchase inserts for her shoes.   Labs Review Labs Reviewed - No data to display Imaging Review No results found.   EKG Interpretation None      MDM   Final diagnoses:  Plantar fasciitis, right    Medical screening examination/treatment/procedure(s) were performed by non-physician practitioner and as supervising physician I was immediately available for consultation/collaboration.   EKG Interpretation None        I personally performed the services described in this documentation, which was scribed in my presence. The recorded  information has been reviewed and is accurate.     Tanna Furry, MD 09/24/13 [phone]

## 2014-03-07 DIAGNOSIS — Z8709 Personal history of other diseases of the respiratory system: Secondary | ICD-10-CM | POA: Insufficient documentation

## 2014-03-07 DIAGNOSIS — L0231 Cutaneous abscess of buttock: Secondary | ICD-10-CM | POA: Insufficient documentation

## 2014-03-07 DIAGNOSIS — L03317 Cellulitis of buttock: Principal | ICD-10-CM

## 2014-03-07 DIAGNOSIS — Z86711 Personal history of pulmonary embolism: Secondary | ICD-10-CM | POA: Insufficient documentation

## 2014-03-07 DIAGNOSIS — Z7982 Long term (current) use of aspirin: Secondary | ICD-10-CM | POA: Insufficient documentation

## 2014-03-08 ENCOUNTER — Encounter (HOSPITAL_COMMUNITY): Payer: Self-pay | Admitting: Emergency Medicine

## 2014-03-08 ENCOUNTER — Emergency Department (HOSPITAL_COMMUNITY)
Admission: EM | Admit: 2014-03-08 | Discharge: 2014-03-08 | Disposition: A | Discharge status: Home / Self Care | Payer: Medicaid Other | Attending: Emergency Medicine | Admitting: Emergency Medicine

## 2014-03-08 DIAGNOSIS — L0231 Cutaneous abscess of buttock: Secondary | ICD-10-CM

## 2014-03-08 LAB — BASIC METABOLIC PANEL
Anion gap: 11 (ref 5–15)
BUN: 14 mg/dL (ref 6–23)
CALCIUM: 8.9 mg/dL (ref 8.4–10.5)
CHLORIDE: 104 meq/L (ref 96–112)
CO2: 23 meq/L (ref 19–32)
CREATININE: 1.02 mg/dL (ref 0.50–1.10)
GFR calc Af Amer: 77 mL/min — ABNORMAL LOW (ref >90)
GFR calc non Af Amer: 67 mL/min — ABNORMAL LOW (ref >90)
Glucose, Bld: 94 mg/dL (ref 70–99)
Potassium: 3.8 mEq/L (ref 3.7–5.3)
Sodium: 138 mEq/L (ref 137–147)

## 2014-03-08 LAB — CBC
HCT: 30.7 % — ABNORMAL LOW (ref 36.0–46.0)
HEMOGLOBIN: 9.3 g/dL — AB (ref 12.0–15.0)
MCH: 19.1 pg — ABNORMAL LOW (ref 26.0–34.0)
MCHC: 30.3 g/dL (ref 30.0–36.0)
MCV: 63.2 fL — AB (ref 78.0–100.0)
Platelets: 376 10*3/uL (ref 150–400)
RBC: 4.86 MIL/uL (ref 3.87–5.11)
RDW: 19.3 % — ABNORMAL HIGH (ref 11.5–15.5)
WBC: 9.6 10*3/uL (ref 4.0–10.5)

## 2014-03-08 MED ORDER — BISACODYL 5 MG PO TBEC: 5.0000 mg | DELAYED_RELEASE_TABLET | Freq: Every day | ORAL | Status: DC

## 2014-03-08 MED ORDER — OXYCODONE-ACETAMINOPHEN 5-325 MG PO TABS: 1.0000 | ORAL_TABLET | ORAL | Status: DC | PRN

## 2014-03-08 MED ORDER — LIDOCAINE-EPINEPHRINE 2 %-1:100000 IJ SOLN
30.0000 mL | Freq: Once | INTRAMUSCULAR | Status: AC
  Administered 2014-03-08: 30 mL
  Filled 2014-03-08: qty 2

## 2014-03-08 MED ORDER — SULFAMETHOXAZOLE-TMP DS 800-160 MG PO TABS
1.0000 | ORAL_TABLET | Freq: Once | ORAL | Status: AC
  Administered 2014-03-08: 1 via ORAL
  Filled 2014-03-08: qty 1

## 2014-03-08 MED ORDER — SULFAMETHOXAZOLE-TRIMETHOPRIM 800-160 MG PO TABS: 1.0000 | ORAL_TABLET | Freq: Two times a day (BID) | ORAL | Status: DC

## 2014-03-08 MED ORDER — OXYCODONE-ACETAMINOPHEN 5-325 MG PO TABS
1.0000 | ORAL_TABLET | Freq: Once | ORAL | Status: AC
  Administered 2014-03-08: 1 via ORAL
  Filled 2014-03-08: qty 1

## 2014-03-08 NOTE — ED Provider Notes (Signed)
CSN: [id number]     Arrival date & time 03/07/14  2352 History   First MD Initiated Contact with Patient 03/08/14 0300     Chief Complaint  Patient presents with  . Insect Bite  . Cellulitis      The history is provided by the patient.   patient presents with worsening swelling of her left buttock and associated redness or warmth.  No fevers or chills.  History of abscess.  She's tried ibuprofen without improvement in her symptoms.  She feels as though the area is becoming larger over the past 24 hours.  No abdominal pain.  No other complaints.  Symptoms are mild to moderate in severity    Past Medical History  Diagnosis Date  . Lung collapse   . Pulmonary embolism, blood-clot, obstetric    Past Surgical History  Procedure Laterality Date  . Rib resection     Family History  Problem Relation Age of Onset  . COPD Father   . Stroke Father    History  Substance Use Topics  . Smoking status: Never Smoker   . Smokeless tobacco: Never Used  . Alcohol Use: Yes     Comment: socially    OB History   Grav Para Term Preterm Abortions TAB SAB Ect Mult Living   1 1 1  0 0 0 0 0 0 1     Review of Systems  All other systems reviewed and are negative.     Allergies  Review of patient's allergies indicates no known allergies.  Home Medications   Prior to Admission medications   Medication Sig Start Date End Date Taking? Authorizing Provider  Aspirin-Caffeine (BC FAST PAIN RELIEF PO) Take 1 packet by mouth every 4 (four) hours as needed (pain).   Yes Historical Provider, MD  ibuprofen (ADVIL,MOTRIN) 200 MG tablet Take 200 mg by mouth every 6 (six) hours as needed (for pain.).   Yes Historical Provider, MD  bisacodyl (DULCOLAX) 5 MG EC tablet Take 1 tablet (5 mg total) by mouth daily. 03/08/14   Hoy Morn, MD  oxyCODONE-acetaminophen (PERCOCET/ROXICET) 5-325 MG per tablet Take 1 tablet by mouth every 4 (four) hours as needed for severe pain. 03/08/14   Hoy Morn, MD   sulfamethoxazole-trimethoprim (SEPTRA DS) 800-160 MG per tablet Take 1 tablet by mouth every 12 (twelve) hours. 03/08/14   Hoy Morn, MD   BP 110/68  Pulse 102  Temp(Src) 98.1 F (36.7 C) (Oral)  Resp 18  Ht 5\' 10"  (1.778 m)  Wt 270 lb (122.471 kg)  BMI 38.74 kg/m2  SpO2 100%  LMP 03/01/2014 Physical Exam  Nursing note and vitals reviewed. Constitutional: She is oriented to person, place, and time. She appears well-developed and well-nourished.  HENT:  Head: Normocephalic.  Eyes: EOM are normal.  Neck: Normal range of motion.  Pulmonary/Chest: Effort normal.  Abdominal: She exhibits no distension.  Musculoskeletal: Normal range of motion.  Abscess of left buttock with small associated cellulitis. Induration and mild fluctuance. No drainage  Neurological: She is alert and oriented to person, place, and time.  Psychiatric: She has a normal mood and affect.    ED Course  Procedures (including critical care time)  INCISION AND DRAINAGE Performed by: Hoy Morn Consent: Verbal consent obtained. Risks and benefits: risks, benefits and alternatives were discussed Time out performed prior to procedure Type: abscess Body area: Left buttock Anesthesia: local infiltration Incision was made with a scalpel. Local anesthetic: lidocaine 2 % with epinephrine Anesthetic total:  10 ml Complexity: complex Blunt dissection to break up loculations Drainage: purulent Drainage amount: Small  Packing material: None  Patient tolerance: Patient tolerated the procedure well with no immediate complications.    Labs Review Labs Reviewed  CBC - Abnormal; Notable for the following:    Hemoglobin 9.3 (*)    HCT 30.7 (*)    MCV 63.2 (*)    MCH 19.1 (*)    RDW 19.3 (*)    All other components within normal limits  BASIC METABOLIC PANEL - Abnormal; Notable for the following:    GFR calc non Af Amer 67 (*)    GFR calc Af Amer 77 (*)    All other components within normal limits     Imaging Review No results found.   EKG Interpretation None      MDM   Final diagnoses:  Abscess of right buttock    Abscess with incision and drainage.  Small surrounding cellulitis.  Placed on antibiotics.  Warm water soaks.  Patient understands return to the ER for new or worsening symptoms    Hoy Morn, MD 03/08/14 [phone]

## 2014-03-08 NOTE — Discharge Instructions (Signed)

## 2014-03-08 NOTE — ED Notes (Signed)
Pt states she was bitten by an insect 3 days ago, when she woke up today the area was much larger.  Area is warm, red and painful w/ a cellulitis appearance.

## 2014-04-21 IMAGING — CT CT ANGIO CHEST
1 of 2 series · 19 of 32 positions shown · IV contrast (omnipaque)
Comparison: CT chest 07/20/2012.

CLINICAL DATA: Chest pain and shortness of breath.

CT ANGIOGRAPHY CHEST
TECHNIQUE: Multidetector CT imaging of the chest using the
standard protocol during bolus administration of intravenous
contrast. Multiplanar reconstructed images including MIPs were
obtained and reviewed to evaluate the vascular anatomy.
Contrast: 100mL OMNIPAQUE IOHEXOL 350 MG/ML SOLN

[Series 5: thins for pacs · axial · 0.63mm/px · z∈[+34,+216]mm · 19 of 204 slices shown]
[im 11/204  lung]
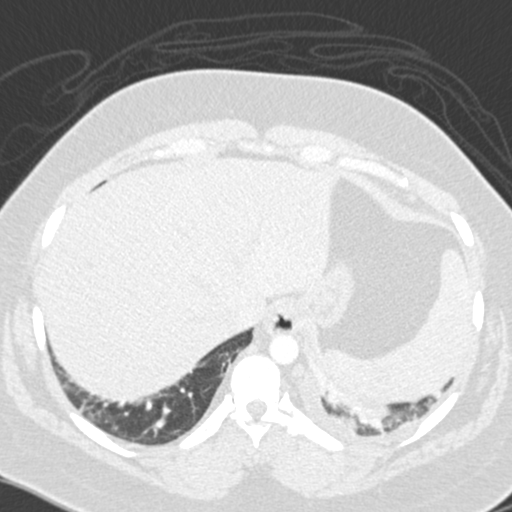
[im 21/204  mediastinal]
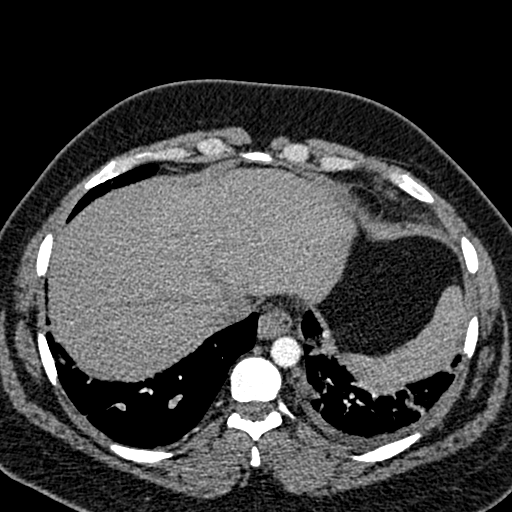
[im 31/204  lung]
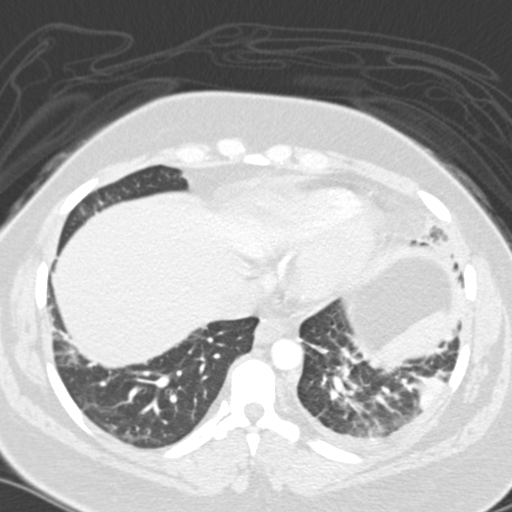
[im 51/204  mediastinal]
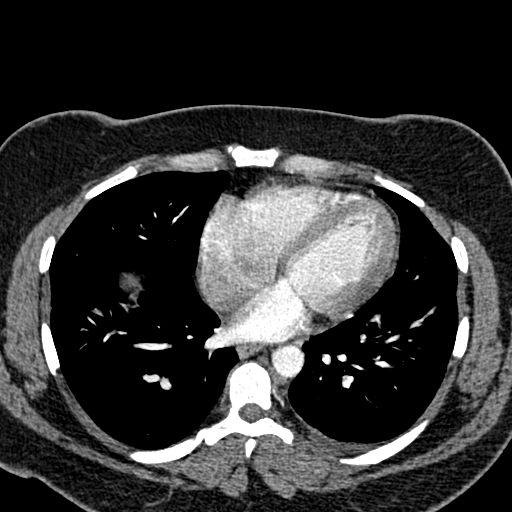
[im 61/204  lung]
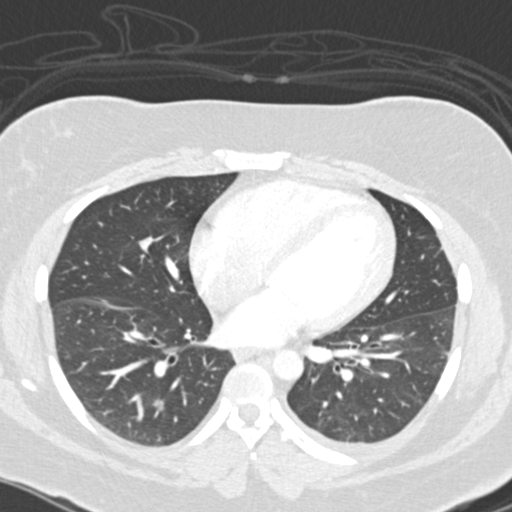
[im 68/204  mediastinal]
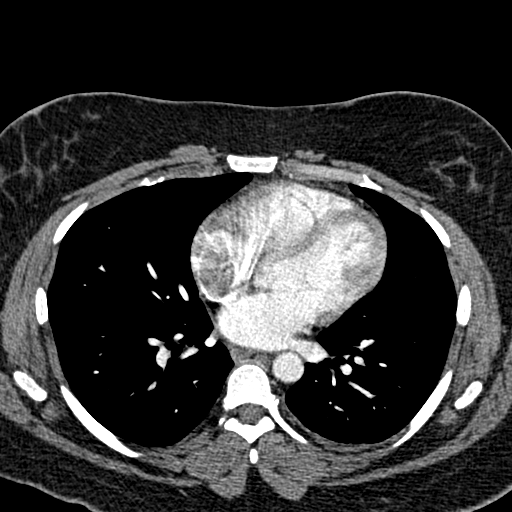
[im 72/204  lung]
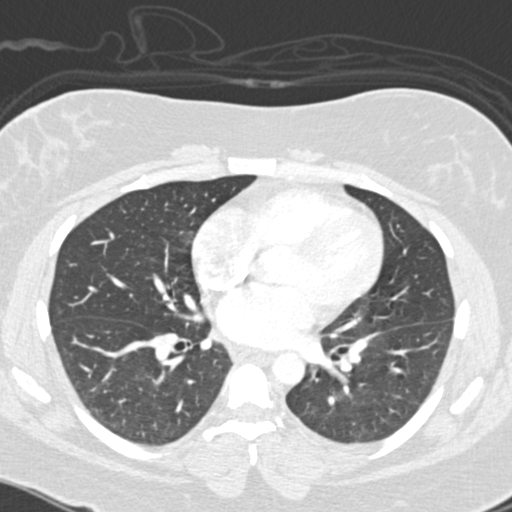
[im 82/204  mediastinal]
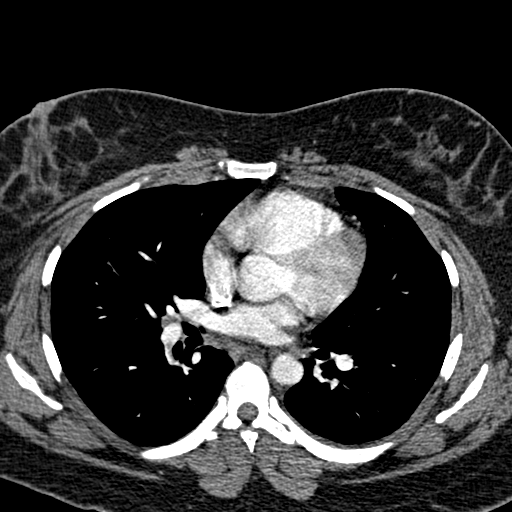
[im 92/204  lung]
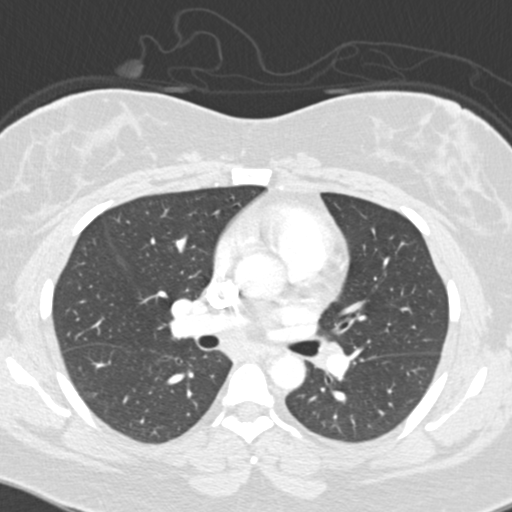
[im 102/204  mediastinal]
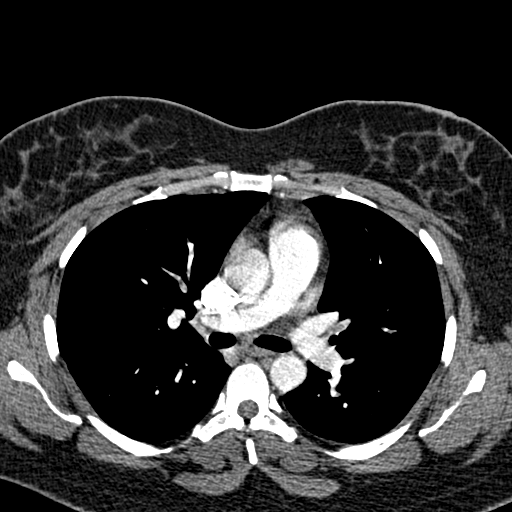
[im 112/204  lung]
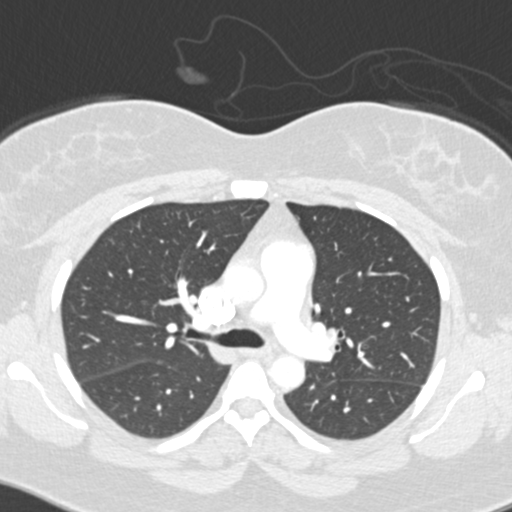
[im 122/204  mediastinal]
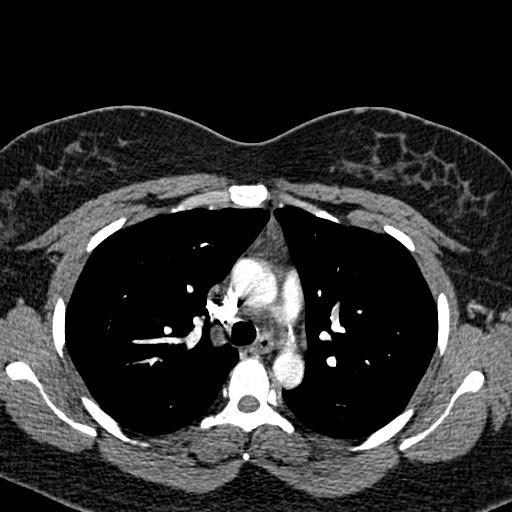
[im 132/204  lung]
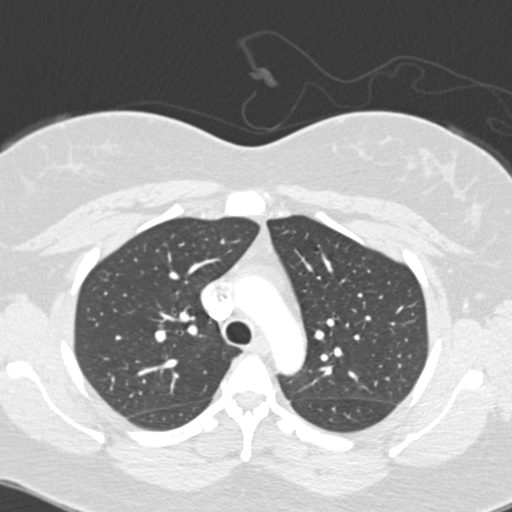
[im 136/204  mediastinal]
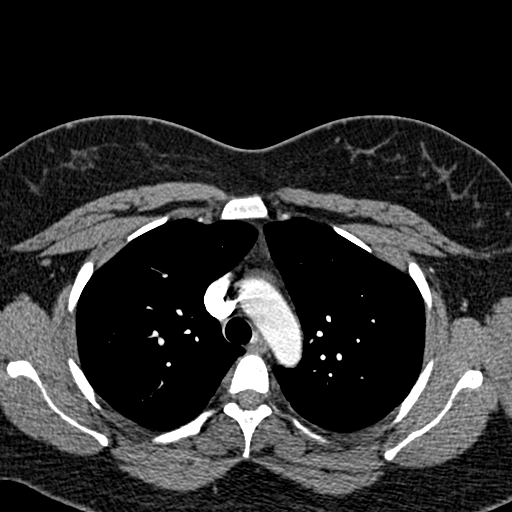
[im 143/204  lung]
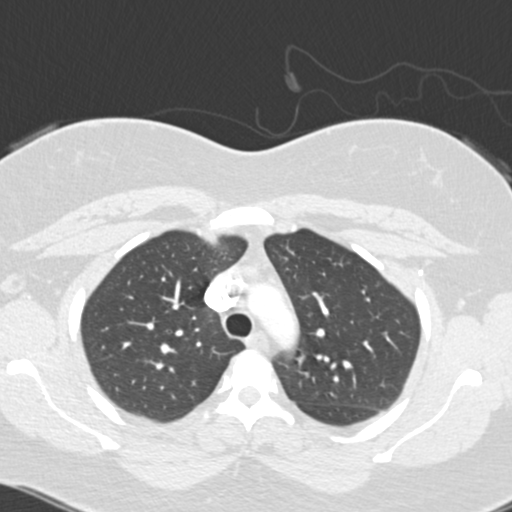
[im 153/204  mediastinal]
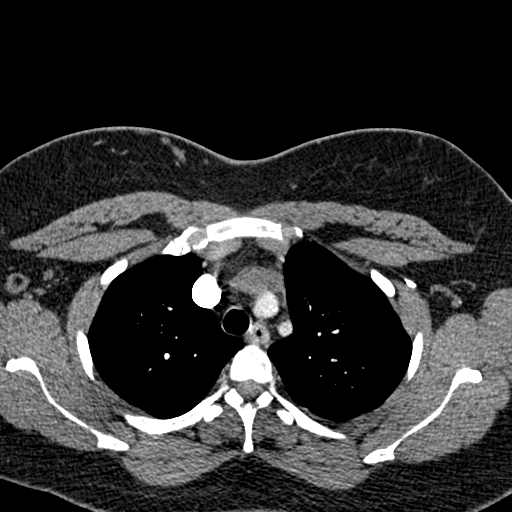
[im 173/204  lung]
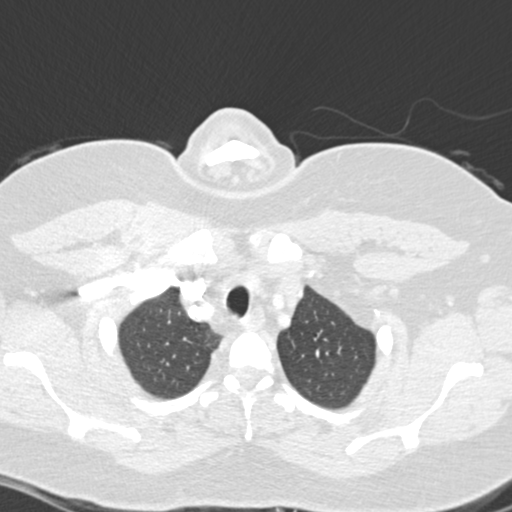
[im 183/204  mediastinal]
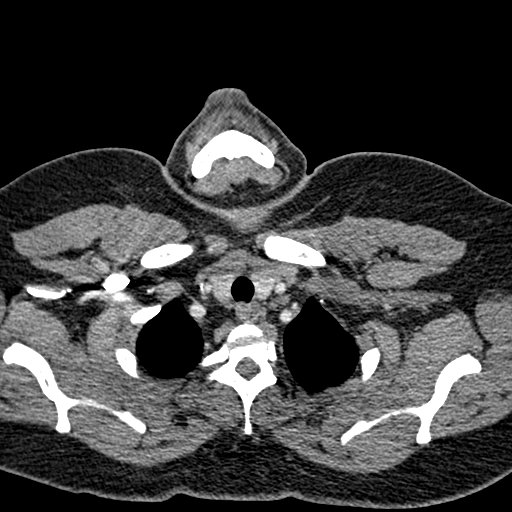
[im 193/204  lung]
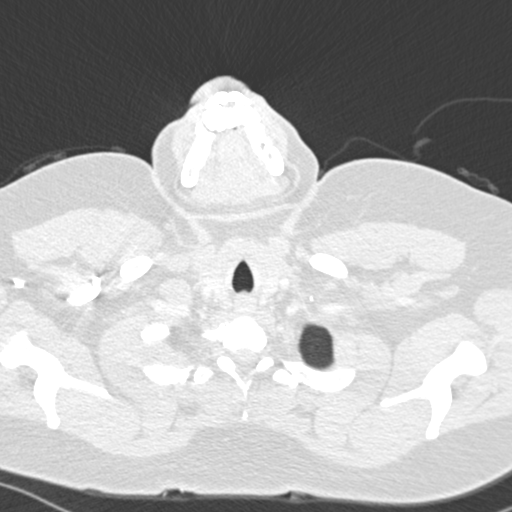

[19 of 32 positions shown; findings below may reference images not displayed]

FINDINGS: The chest wall is unremarkable and stable.  No breast
masses, supraclavicular or axillary adenopathy.  Small scattered
benign appearing fatty lymph nodes are noted.

The bony thorax is intact.

The heart is normal in size.  No pericardial effusion.  No
mediastinal or hilar lymphadenopathy.  Small scattered lymph nodes
are noted.  The esophagus is grossly normal. A small hiatal hernia
is noted.  The aorta is normal in caliber. No dissection.

The pulmonary arterial tree is fairly well opacified.  There are
bilateral pulmonary emboli.

Examination of the lung parenchyma demonstrates patchy subsegmental
bibasilar atelectasis.  No worrisome mass lesions or
bronchiectasis.

The upper abdomen is unremarkable.
IMPRESSION: 1.  Small bilateral pulmonary emboli.
2.  Normal thoracic aorta.
3.  Patchy bibasilar subsegmental atelectasis.

## 2014-05-18 ENCOUNTER — Encounter (HOSPITAL_COMMUNITY): Payer: Self-pay | Admitting: Emergency Medicine

## 2014-07-16 ENCOUNTER — Emergency Department (HOSPITAL_COMMUNITY): Payer: Self-pay

## 2014-07-16 ENCOUNTER — Inpatient Hospital Stay (HOSPITAL_COMMUNITY)
Admission: EM | Admit: 2014-07-16 | Discharge: 2014-07-18 | DRG: 176 | Disposition: A | Discharge status: Home / Self Care | Payer: Self-pay | Attending: Internal Medicine | Admitting: Internal Medicine

## 2014-07-16 ENCOUNTER — Encounter (HOSPITAL_COMMUNITY): Payer: Self-pay | Admitting: Emergency Medicine

## 2014-07-16 ENCOUNTER — Emergency Department (HOSPITAL_COMMUNITY): Payer: Medicaid Other

## 2014-07-16 DIAGNOSIS — D649 Anemia, unspecified: Secondary | ICD-10-CM | POA: Diagnosis present

## 2014-07-16 DIAGNOSIS — R0781 Pleurodynia: Secondary | ICD-10-CM | POA: Insufficient documentation

## 2014-07-16 DIAGNOSIS — D509 Iron deficiency anemia, unspecified: Secondary | ICD-10-CM

## 2014-07-16 DIAGNOSIS — Z86711 Personal history of pulmonary embolism: Secondary | ICD-10-CM

## 2014-07-16 DIAGNOSIS — Z7901 Long term (current) use of anticoagulants: Secondary | ICD-10-CM

## 2014-07-16 DIAGNOSIS — E669 Obesity, unspecified: Secondary | ICD-10-CM

## 2014-07-16 DIAGNOSIS — Z7982 Long term (current) use of aspirin: Secondary | ICD-10-CM

## 2014-07-16 DIAGNOSIS — D6862 Lupus anticoagulant syndrome: Secondary | ICD-10-CM

## 2014-07-16 DIAGNOSIS — Z823 Family history of stroke: Secondary | ICD-10-CM

## 2014-07-16 DIAGNOSIS — I2699 Other pulmonary embolism without acute cor pulmonale: Principal | ICD-10-CM | POA: Diagnosis present

## 2014-07-16 DIAGNOSIS — R0789 Other chest pain: Secondary | ICD-10-CM

## 2014-07-16 DIAGNOSIS — Z825 Family history of asthma and other chronic lower respiratory diseases: Secondary | ICD-10-CM

## 2014-07-16 LAB — PROTIME-INR
INR: 1.1 (ref 0.00–1.49)
Prothrombin Time: 14.3 seconds (ref 11.6–15.2)

## 2014-07-16 LAB — COMPREHENSIVE METABOLIC PANEL
ALT: 17 U/L (ref 0–35)
AST: 17 U/L (ref 0–37)
Albumin: 3.2 g/dL — ABNORMAL LOW (ref 3.5–5.2)
Alkaline Phosphatase: 73 U/L (ref 39–117)
Anion gap: 6 (ref 5–15)
BUN: 10 mg/dL (ref 6–23)
CO2: 25 mmol/L (ref 19–32)
Calcium: 8.4 mg/dL (ref 8.4–10.5)
Chloride: 106 mEq/L (ref 96–112)
Creatinine, Ser: 0.87 mg/dL (ref 0.50–1.10)
GFR calc Af Amer: >90 mL/min (ref >90)
GFR calc non Af Amer: 80 mL/min — ABNORMAL LOW (ref >90)
Glucose, Bld: 103 mg/dL — ABNORMAL HIGH (ref 70–99)
Potassium: 4 mmol/L (ref 3.5–5.1)
Sodium: 137 mmol/L (ref 135–145)
Total Bilirubin: 0.4 mg/dL (ref 0.3–1.2)
Total Protein: 7 g/dL (ref 6.0–8.3)

## 2014-07-16 LAB — CBC WITH DIFFERENTIAL/PLATELET
Basophils Absolute: 0 10*3/uL (ref 0.0–0.1)
Basophils Relative: 0 % (ref 0–1)
Eosinophils Absolute: 0.2 10*3/uL (ref 0.0–0.7)
Eosinophils Relative: 2 % (ref 0–5)
HCT: 30.2 % — ABNORMAL LOW (ref 36.0–46.0)
Hemoglobin: 8.9 g/dL — ABNORMAL LOW (ref 12.0–15.0)
Lymphocytes Relative: 28 % (ref 12–46)
Lymphs Abs: 2.6 10*3/uL (ref 0.7–4.0)
MCH: 18.9 pg — ABNORMAL LOW (ref 26.0–34.0)
MCHC: 29.5 g/dL — ABNORMAL LOW (ref 30.0–36.0)
MCV: 64 fL — ABNORMAL LOW (ref 78.0–100.0)
Monocytes Absolute: 1 10*3/uL (ref 0.1–1.0)
Monocytes Relative: 11 % (ref 3–12)
Neutro Abs: 5.4 10*3/uL (ref 1.7–7.7)
Neutrophils Relative %: 59 % (ref 43–77)
Platelets: 338 10*3/uL (ref 150–400)
RBC: 4.72 MIL/uL (ref 3.87–5.11)
RDW: 18.8 % — ABNORMAL HIGH (ref 11.5–15.5)
WBC: 9.2 10*3/uL (ref 4.0–10.5)

## 2014-07-16 LAB — TROPONIN I: Troponin I: <0.03 ng/mL (ref <0.031)

## 2014-07-16 LAB — RAPID URINE DRUG SCREEN, HOSP PERFORMED
Amphetamines: NOT DETECTED
Barbiturates: NOT DETECTED
Benzodiazepines: NOT DETECTED
Cocaine: NOT DETECTED
Opiates: POSITIVE — AB
Tetrahydrocannabinol: NOT DETECTED

## 2014-07-16 LAB — RETICULOCYTES
RBC.: 4.63 MIL/uL (ref 3.87–5.11)
RETIC CT PCT: 1.7 % (ref 0.4–3.1)
Retic Count, Absolute: 78.7 10*3/uL (ref 19.0–186.0)

## 2014-07-16 LAB — HEPARIN LEVEL (UNFRACTIONATED)
Heparin Unfractionated: 0.35 IU/mL (ref 0.30–0.70)
Heparin Unfractionated: 0.36 IU/mL (ref 0.30–0.70)

## 2014-07-16 LAB — APTT: APTT: 29 s (ref 24–37)

## 2014-07-16 LAB — BRAIN NATRIURETIC PEPTIDE: B Natriuretic Peptide: 41 pg/mL (ref 0.0–100.0)

## 2014-07-16 MED ORDER — WARFARIN SODIUM 2.5 MG PO TABS
12.5000 mg | ORAL_TABLET | Freq: Once | ORAL | Status: AC
  Administered 2014-07-16: 12.5 mg via ORAL
  Filled 2014-07-16: qty 1

## 2014-07-16 MED ORDER — WARFARIN - PHARMACIST DOSING INPATIENT: Freq: Every day | Status: DC

## 2014-07-16 MED ORDER — ONDANSETRON HCL 4 MG/2ML IJ SOLN: 4.0000 mg | Freq: Four times a day (QID) | INTRAMUSCULAR | Status: DC | PRN

## 2014-07-16 MED ORDER — MORPHINE SULFATE 4 MG/ML IJ SOLN
4.0000 mg | Freq: Once | INTRAMUSCULAR | Status: AC
  Administered 2014-07-16: 4 mg via INTRAVENOUS
  Filled 2014-07-16: qty 1

## 2014-07-16 MED ORDER — ACETAMINOPHEN 650 MG RE SUPP: 650.0000 mg | Freq: Four times a day (QID) | RECTAL | Status: DC | PRN

## 2014-07-16 MED ORDER — SODIUM CHLORIDE 0.9 % IV BOLUS (SEPSIS)
1000.0000 mL | Freq: Once | INTRAVENOUS | Status: AC
  Administered 2014-07-16: 1000 mL via INTRAVENOUS

## 2014-07-16 MED ORDER — WARFARIN VIDEO: Freq: Once | Status: DC

## 2014-07-16 MED ORDER — HEPARIN BOLUS VIA INFUSION
3000.0000 [IU] | Freq: Once | INTRAVENOUS | Status: AC
  Administered 2014-07-16: 3000 [IU] via INTRAVENOUS
  Filled 2014-07-16: qty 3000

## 2014-07-16 MED ORDER — HEPARIN (PORCINE) IN NACL 100-0.45 UNIT/ML-% IJ SOLN
1750.0000 [IU]/h | INTRAMUSCULAR | Status: AC
  Administered 2014-07-16 – 2014-07-17 (×2): 1750 [IU]/h via INTRAVENOUS
  Filled 2014-07-16 (×3): qty 250

## 2014-07-16 MED ORDER — IOHEXOL 350 MG/ML SOLN
100.0000 mL | Freq: Once | INTRAVENOUS | Status: AC | PRN
  Administered 2014-07-16: 100 mL via INTRAVENOUS

## 2014-07-16 MED ORDER — MORPHINE SULFATE 4 MG/ML IJ SOLN
8.0000 mg | Freq: Once | INTRAMUSCULAR | Status: AC
  Administered 2014-07-16: 8 mg via INTRAVENOUS
  Filled 2014-07-16: qty 2

## 2014-07-16 MED ORDER — SODIUM CHLORIDE 0.9 % IJ SOLN
3.0000 mL | Freq: Two times a day (BID) | INTRAMUSCULAR | Status: DC
  Administered 2014-07-17: 3 mL via INTRAVENOUS

## 2014-07-16 MED ORDER — ACETAMINOPHEN 325 MG PO TABS
650.0000 mg | ORAL_TABLET | Freq: Four times a day (QID) | ORAL | Status: DC | PRN
  Administered 2014-07-16 – 2014-07-17 (×2): 650 mg via ORAL
  Filled 2014-07-16 (×2): qty 2

## 2014-07-16 MED ORDER — ONDANSETRON HCL 4 MG PO TABS: 4.0000 mg | ORAL_TABLET | Freq: Four times a day (QID) | ORAL | Status: DC | PRN

## 2014-07-16 MED ORDER — ONDANSETRON HCL 4 MG/2ML IJ SOLN
4.0000 mg | Freq: Once | INTRAMUSCULAR | Status: AC
  Administered 2014-07-16: 4 mg via INTRAVENOUS
  Filled 2014-07-16: qty 2

## 2014-07-16 MED ORDER — OXYCODONE HCL 5 MG PO TABS
5.0000 mg | ORAL_TABLET | ORAL | Status: DC | PRN
  Administered 2014-07-16 – 2014-07-18 (×6): 5 mg via ORAL
  Filled 2014-07-16 (×6): qty 1

## 2014-07-16 MED ORDER — PATIENT'S GUIDE TO USING COUMADIN BOOK
Freq: Once | Status: DC
  Filled 2014-07-16: qty 1

## 2014-07-16 MED ORDER — MORPHINE SULFATE 4 MG/ML IJ SOLN
4.0000 mg | INTRAMUSCULAR | Status: DC | PRN
  Administered 2014-07-16 – 2014-07-17 (×3): 4 mg via INTRAVENOUS
  Filled 2014-07-16 (×3): qty 1

## 2014-07-16 NOTE — ED Notes (Signed)
Pt visitor offered water.

## 2014-07-16 NOTE — Progress Notes (Signed)
Brief pharmacy note:  In brief, 36 yoF on IV heparin / warfarin bridge for new PE.  1st heparin level therapeutic (goal 0.3-0.7).  No issues per RN with infusion and no s/sxs bleeding.   Plan: Continue heparin infusino at 1750 units/hr.  Repeat heparin level in 6 hours.    Ralene Bathe, PharmD, BCPS 07/16/2014, 10:09 PM  Pager: [phone]

## 2014-07-16 NOTE — Progress Notes (Signed)
ANTICOAGULATION CONSULT NOTE - Initial Consult  Pharmacy Consult for IV heparin  Indication: pulmonary embolus  No Known Allergies  Patient Measurements: Weight: 270 lb (122.471 kg) Heparin Dosing Weight: 96.7 kg  Vital Signs: Temp: 97.9 F (36.6 C) (12/31 0950) Temp Source: Oral (12/31 1121) BP: 121/65 mmHg (12/31 1146) Pulse Rate: 88 (12/31 1146)  Labs:  Recent Labs  07/16/14 1034  HGB 8.9*  HCT 30.2*  PLT 338  CREATININE 0.87  TROPONINI <0.03    Estimated Creatinine Clearance: 118.6 mL/min (by C-G formula based on Cr of 0.87).   Medical History: Past Medical History  Diagnosis Date  . Lung collapse   . Pulmonary embolism, blood-clot, obstetric     Medications:  Scheduled:  .  morphine injection  8 mg Intravenous Once   Infusions:    Assessment: 43 yo female presented to ER with SOB since last PM. Per CT, patient found to have new PE. Patient with hx PE last year likely due to ankle injury. To start IV heparin per pharmacy dosing. Baseline labs - Hgb 8.9 - monitor  Goal of Therapy:  Heparin level 0.3-0.7 units/ml Monitor platelets by anticoagulation protocol: Yes   Plan:  1) IV heparin bolus 3000 units 2) IV heparin infusion rate of 1750 units/hr 3) Check heparin level 6 hours after start of heparin 4) Daily CBC and heparin level 5) Recommend discontinue IV heparin and start alternative form of anticoagulation such as Lovenox or new oral agent as IV heparin not recommended as initial therapy for new VTE per guidelines   Adrian Saran, PharmD, BCPS Pager [phone] 07/16/2014 12:49 PM

## 2014-07-16 NOTE — Progress Notes (Signed)
[facility] staff Stacy Provided pt with self-pay resources and application prior to leaving [facility] ED

## 2014-07-16 NOTE — ED Notes (Signed)
Pt transported to radiology via stretcher with Tech

## 2014-07-16 NOTE — ED Provider Notes (Signed)
CSN: [id number]     Arrival date & time 07/16/14  0940 History   First MD Initiated Contact with Patient 07/16/14 0945     Chief Complaint  Patient presents with  . Shortness of Breath    Onset was last PM     (Consider location/radiation/quality/duration/timing/severity/associated sxs/prior Treatment) HPI  43 year old female presents with right-sided chest pain and shortness of breath since last night. Started around 10 PM. Significantly worsened this morning. No trauma. Patient rates the pain as severe. Worse with inspiration or lying flat. Worse with certain types of movements. Patient had similar pain when she had her pulmonary embolism last year that was it should be noted due to an ankle injury. Denies fevers or congestion. Mild dry cough that mostly occurs when she becomes short of breath. Denies any new leg swelling or leg pain.  Past Medical History  Diagnosis Date  . Lung collapse   . Pulmonary embolism, blood-clot, obstetric    Past Surgical History  Procedure Laterality Date  . Rib resection     Family History  Problem Relation Age of Onset  . COPD Father   . Stroke Father    History  Substance Use Topics  . Smoking status: Never Smoker   . Smokeless tobacco: Never Used  . Alcohol Use: Yes     Comment: socially    OB History    Gravida Para Term Preterm AB TAB SAB Ectopic Multiple Living   1 1 1  0 0 0 0 0 0 1     Review of Systems  Constitutional: Negative for fever.  HENT: Negative for congestion.   Respiratory: Positive for cough and shortness of breath.   Cardiovascular: Positive for chest pain. Negative for leg swelling.  Gastrointestinal: Negative for vomiting.  Musculoskeletal: Positive for back pain.  All other systems reviewed and are negative.     Allergies  Review of patient's allergies indicates no known allergies.  Home Medications   Prior to Admission medications   Medication Sig Start Date End Date Taking? Authorizing Provider   Aspirin-Caffeine (BC FAST PAIN RELIEF PO) Take 1 packet by mouth every 4 (four) hours as needed (pain).    Historical Provider, MD  bisacodyl (DULCOLAX) 5 MG EC tablet Take 1 tablet (5 mg total) by mouth daily. 03/08/14   Hoy Morn, MD  ibuprofen (ADVIL,MOTRIN) 200 MG tablet Take 200 mg by mouth every 6 (six) hours as needed (for pain.).    Historical Provider, MD  oxyCODONE-acetaminophen (PERCOCET/ROXICET) 5-325 MG per tablet Take 1 tablet by mouth every 4 (four) hours as needed for severe pain. 03/08/14   Hoy Morn, MD  sulfamethoxazole-trimethoprim (SEPTRA DS) 800-160 MG per tablet Take 1 tablet by mouth every 12 (twelve) hours. 03/08/14   Hoy Morn, MD   BP 117/73 mmHg  Pulse 94  Temp(Src) 97.9 F (36.6 C) (Oral)  Resp 16  Wt 270 lb (122.471 kg)  SpO2 98%  LMP 07/13/2014 Physical Exam  Constitutional: She is oriented to person, place, and time. She appears well-developed and well-nourished.  HENT:  Head: Normocephalic and atraumatic.  Right Ear: External ear normal.  Left Ear: External ear normal.  Nose: Nose normal.  Eyes: Right eye exhibits no discharge. Left eye exhibits no discharge.  Cardiovascular: Normal rate, regular rhythm and normal heart sounds.   Pulses:      Radial pulses are 2+ on the right side, and 2+ on the left side.  Pulmonary/Chest: Effort normal and breath sounds normal. She  exhibits tenderness (right anterior and lateral).  Abdominal: Soft. There is no tenderness.  Musculoskeletal:  Upper right back tenderness  Neurological: She is alert and oriented to person, place, and time.  Skin: Skin is warm and dry. No rash noted.  Nursing note and vitals reviewed.   ED Course  Procedures (including critical care time) Labs Review Labs Reviewed  COMPREHENSIVE METABOLIC PANEL - Abnormal; Notable for the following:    Glucose, Bld 103 (*)    Albumin 3.2 (*)    GFR calc non Af Amer 80 (*)    All other components within normal limits  CBC WITH  DIFFERENTIAL - Abnormal; Notable for the following:    Hemoglobin 8.9 (*)    HCT 30.2 (*)    MCV 64.0 (*)    MCH 18.9 (*)    MCHC 29.5 (*)    RDW 18.8 (*)    All other components within normal limits  TROPONIN I  BRAIN NATRIURETIC PEPTIDE  PROTIME-INR  APTT  HEPARIN LEVEL (UNFRACTIONATED)    Imaging Review Dg Chest 2 View  07/16/2014   CLINICAL DATA:  Right-sided chest pain and difficulty breathing for 1 day  EXAM: CHEST  2 VIEW  COMPARISON:  Chest radiograph Dec 04, 2012; chest CT July 23, 2013  FINDINGS: There is mild bibasilar lung scarring. There is no edema or consolidation. The heart size and pulmonary vascularity are normal. No adenopathy. There are surgical clips in the left upper hemithorax region.  IMPRESSION: Bibasilar lung scarring.  No edema or consolidation.   Electronically Signed   By: Lowella Grip M.D.   On: 07/16/2014 10:27   Ct Angio Chest Pe W/cm &/or Wo Cm  07/16/2014   CLINICAL DATA:  Right-sided chest pain, shortness of breath, onset last night. Pain worse with inspiration.  EXAM: CT ANGIOGRAPHY CHEST WITH CONTRAST  TECHNIQUE: Multidetector CT imaging of the chest was performed using the standard protocol during bolus administration of intravenous contrast. Multiplanar CT image reconstructions and MIPs were obtained to evaluate the vascular anatomy.  CONTRAST:  145mL OMNIPAQUE IOHEXOL 350 MG/ML SOLN  COMPARISON:  07/23/2013  FINDINGS: Filling defects are noted in the right lower lobe pulmonary arterial branches compatible with acute pulmonary emboli. Associated small right pleural effusion. Bibasilar ground-glass opacities are noted, likely atelectasis. The right atrial to left atrial ratio is normal, less than 0.9. No mediastinal, hilar, or axillary adenopathy. Chest wall soft tissues are unremarkable. Imaging into the upper abdomen shows no acute findings. No acute bony abnormality.  Review of the MIP images confirms the above findings.  IMPRESSION: Right lower  lobe pulmonary emboli with associated small right pleural effusion.  Bibasilar atelectasis.  Critical Value/emergent results were called by telephone at the time of interpretation on 07/16/2014 at 12:35 pm to Dr. Sherwood Gambler , who verbally acknowledged these results.   Electronically Signed   By: Rolm Baptise M.D.   On: 07/16/2014 12:38     EKG Interpretation   Date/Time:  Thursday July 16 2014 09:48:54 EST Ventricular Rate:  96 PR Interval:  134 QRS Duration: 84 QT Interval:  352 QTC Calculation: 445 R Axis:   83 Text Interpretation:  Sinus rhythm Borderline T abnormalities, anterior  leads Baseline wander in lead(s) V2 no significant change compared to 2014  Confirmed by Karcyn Menn  MD, [facility] ([id number]) on 07/16/2014 9:52:04 AM      MDM   Final diagnoses:  Pulmonary embolus    Patient with right-sided chest pain consistent with a pulmonary embolism  seen on CT scan. She is hemodynamically stable, no hypoxia or hypotension. No evidence of coronary ischemia associated with this. Given IV morphine for pain, and will need admission for IV heparin. There is no evidence of this being a massive pulmonary embolism. Will admit to the hospitalist, consulted Dr. Carles Collet.    Ephraim Hamburger, MD 07/16/14 [phone]

## 2014-07-16 NOTE — Progress Notes (Addendum)
ANTICOAGULATION CONSULT NOTE - Initial Consult  Pharmacy Consult for IV heparin and warfarin Indication: pulmonary embolus  No Known Allergies  Patient Measurements: Weight: 270 lb (122.471 kg) Heparin Dosing Weight: 96.7 kg  Vital Signs: Temp: 98.4 F (36.9 C) (12/31 1416) Temp Source: Oral (12/31 1416) BP: 102/58 mmHg (12/31 1414) Pulse Rate: 93 (12/31 1414)  Labs:  Recent Labs  07/16/14 1034  HGB 8.9*  HCT 30.2*  PLT 338  APTT 29  LABPROT 14.3  INR 1.10  CREATININE 0.87  TROPONINI <0.03    Estimated Creatinine Clearance: 118.6 mL/min (by C-G formula based on Cr of 0.87).   Medical History: Past Medical History  Diagnosis Date  . Lung collapse   . Pulmonary embolism, blood-clot, obstetric     Medications:  Scheduled:  . sodium chloride  3 mL Intravenous Q12H   Infusions:  . heparin 1,750 Units/hr (07/16/14 1317)    Assessment: 43 yo female presented to ER with SOB since last PM. Per CT, patient found to have new PE. Patient with hx PE last year likely due to ankle injury. To start IV heparin per pharmacy dosing. Physician discussed oral options for anticoagulation and patient wishes to start warfarin.    Today, 07/16/2014  Baseline labs - Hgb 8.9 - monitor, pltc WNL  INR = 1.10, aPTT = 29  Goal of Therapy:  INR 2 -3 Heparin level 0.3-0.7 units/ml Monitor platelets by anticoagulation protocol: Yes   Plan:   Continue heparin as previously ordered (see pharmacist's note from 12:43)  Warfarin 12.5mg  PO x 1  With previous PE appears her daily dose was ~10mg  daily from notes  Daily INR   Coumadin book/video  Recommend discontinue IV heparin and start Lovenox as heparin not recommended as initial therapy for new VTE per guidelines   Doreene Eland, PharmD, BCPS.   Pager: [phone] 07/16/2014 2:34 PM

## 2014-07-16 NOTE — ED Notes (Signed)
Patient transported to CT 

## 2014-07-16 NOTE — H&P (Signed)
History and Physical  Donna [name] ASN:[id number] DOB: Nov 20, 1970 DOA: 07/16/2014   PCP: Angelica Chessman, MD   Chief Complaint: chest pain, sob  HPI:  43 year old female with a history of iron deficiency anemia, pulmonary embolus presents with one-day history of chest pain and shortness of breath that began on the evening before admission. The patient was diagnosed with a pulmonary embolus in May 2014. The patient was started on Coumadin after heparin bridge. Although initial hypercoagulable studies revealed a positive lupus anticoagulant, the patient ultimately went to see hematology. She went to see Dr. Juliann Mule on 06/16/13.  The patient was taken off of her anticoagulation at that time as she had 6 months of anticoagulation at the time of that appointment. Approximately 2 weeks later, repeat hypercoagulable studies were performed and they were negative including lupus anticoagulant, protein C, protein S, factor V Leiden, and antithrombin III. The patient has been off anticoagulation since 06/16/2013. Repeat CT angiogram of the chest on 07/23/2013 was negative for pulmonary embolus. The patient was doing well and was lost to follow-up. Presently, she continues to complain of chest discomfort and shortness of breath. But she denies fevers, chills, headache, nausea, vomiting, diarrhea, abdominal pain, dysuria, hematuria, hematochezia, melena, hematuria.  The patient denies any oral contraceptives or any recent injury or trauma or surgery. She has not been particularly sedentary, and she denies any recent long drives or airplane rides.  In the emergency department, the patient was hemodynamically stable and afebrile. Oxygen saturation was 100% on room air. CT and chest showed a pulmonary embolus in the right lower lobe with a small right pleural effusion. EKG showed sinus rhythm with T-wave inversion in V1-V3. Hemoglobin was 8.9 and WBC was 9.2. Chest x-ray showed bibasilar  scarring.  Assessment/Plan: Recurrent pulmonary embolus -I have discussed the risks, benefits, and alternatives of warfarin versus factor X A inhibitors -The patient prefers to take warfarin -Start warfarin with heparin bridge -Echocardiogram Atypical chest pain -Due to pulmonary embolus -Cycle troponins -Pain control with oral opioids -Urine drug screen -HIV antibody Iron deficiency anemia -Patient has not been taking iron -Recheck iron studies and reticulocyte count before starting back on iron       Past Medical History  Diagnosis Date  . Lung collapse   . Pulmonary embolism, blood-clot, obstetric    Past Surgical History  Procedure Laterality Date  . Rib resection     Social History:  reports that she has never smoked. She has never used smokeless tobacco. She reports that she drinks alcohol. She reports that she does not use illicit drugs.   Family History  Problem Relation Age of Onset  . COPD Father   . Stroke Father      No Known Allergies    Prior to Admission medications   Medication Sig Start Date End Date Taking? Authorizing Provider  Aspirin-Caffeine (BC FAST PAIN RELIEF PO) Take 1 packet by mouth every 4 (four) hours as needed (for pain).    Yes Historical Provider, MD  bisacodyl (DULCOLAX) 5 MG EC tablet Take 1 tablet (5 mg total) by mouth daily. Patient not taking: Reported on 07/16/2014 03/08/14   Hoy Morn, MD  oxyCODONE-acetaminophen (PERCOCET/ROXICET) 5-325 MG per tablet Take 1 tablet by mouth every 4 (four) hours as needed for severe pain. Patient not taking: Reported on 07/16/2014 03/08/14   Hoy Morn, MD  sulfamethoxazole-trimethoprim [facility] DS) 800-160 MG per tablet Take 1 tablet by mouth every 12 (twelve) hours. Patient  not taking: Reported on 07/16/2014 03/08/14   Hoy Morn, MD    Review of Systems:  Constitutional:  No weight loss, night sweats, Fevers, chills, fatigue.  Head&Eyes: No headache.  No vision loss.  No  eye pain or scotoma ENT:  No Difficulty swallowing,Tooth/dental problems,Sore throat,   Cardio-vascular:  No Orthopnea, PND, swelling in lower extremities,  dizziness, palpitations  GI:  No  abdominal pain, nausea, vomiting, diarrhea, loss of appetite, hematochezia, melena, heartburn, indigestion, Resp:   No cough. No coughing up of blood .No wheezing.No chest wall deformity  Skin:  no rash or lesions.  GU:  no dysuria, change in color of urine, no urgency or frequency. No flank pain.  Musculoskeletal:  No joint pain or swelling. No decreased range of motion. No back pain.  Psych:  No change in mood or affect. No depression or anxiety. Neurologic: No headache, no dysesthesia, no focal weakness, no vision loss. No syncope  Physical Exam: Filed Vitals:   07/16/14 0950 07/16/14 1121 07/16/14 1146 07/16/14 1310  BP: 117/73 121/65 121/65 116/63  Pulse: 94 89 88 92  Temp: 97.9 F (36.6 C)     TempSrc: Oral Oral    Resp: 16 16 19 19   Weight:      SpO2: 98% 97% 100% 100%   General:  A&O x 3, NAD, nontoxic, pleasant/cooperative Head/Eye: No conjunctival hemorrhage, no icterus, [place]/AT, No nystagmus ENT:  No icterus,  No thrush, good dentition, no pharyngeal exudate Neck:  No masses, no lymphadenpathy, no bruits CV:  RRR, no rub, no gallop, no S3 Lung:  CTAB, good air movement, no wheeze, no rhonchi Abdomen: soft/NT, +BS, nondistended, no peritoneal signs Ext: No cyanosis, No rashes, No petechiae, No lymphangitis, No edema Neuro: CNII-XII intact, strength 4/5 in bilateral upper and lower extremities, no dysmetria  Labs on Admission:  Basic Metabolic Panel:  Recent Labs Lab 07/16/14 1034  NA 137  K 4.0  CL 106  CO2 25  GLUCOSE 103*  BUN 10  CREATININE 0.87  CALCIUM 8.4   Liver Function Tests:  Recent Labs Lab 07/16/14 1034  AST 17  ALT 17  ALKPHOS 73  BILITOT 0.4  PROT 7.0  ALBUMIN 3.2*   No results for input(s): LIPASE, AMYLASE in the last 168 hours. No  results for input(s): AMMONIA in the last 168 hours. CBC:  Recent Labs Lab 07/16/14 1034  WBC 9.2  NEUTROABS 5.4  HGB 8.9*  HCT 30.2*  MCV 64.0*  PLT 338   Cardiac Enzymes:  Recent Labs Lab 07/16/14 1034  TROPONINI <0.03   BNP: Invalid input(s): POCBNP CBG: No results for input(s): GLUCAP in the last 168 hours.  Radiological Exams on Admission: Dg Chest 2 View  07/16/2014   CLINICAL DATA:  Right-sided chest pain and difficulty breathing for 1 day  EXAM: CHEST  2 VIEW  COMPARISON:  Chest radiograph Dec 04, 2012; chest CT July 23, 2013  FINDINGS: There is mild bibasilar lung scarring. There is no edema or consolidation. The heart size and pulmonary vascularity are normal. No adenopathy. There are surgical clips in the left upper hemithorax region.  IMPRESSION: Bibasilar lung scarring.  No edema or consolidation.   Electronically Signed   By: Lowella Grip M.D.   On: 07/16/2014 10:27   Ct Angio Chest Pe W/cm &/or Wo Cm  07/16/2014   CLINICAL DATA:  Right-sided chest pain, shortness of breath, onset last night. Pain worse with inspiration.  EXAM: CT ANGIOGRAPHY CHEST WITH CONTRAST  TECHNIQUE: Multidetector CT imaging of the chest was performed using the standard protocol during bolus administration of intravenous contrast. Multiplanar CT image reconstructions and MIPs were obtained to evaluate the vascular anatomy.  CONTRAST:  173mL OMNIPAQUE IOHEXOL 350 MG/ML SOLN  COMPARISON:  07/23/2013  FINDINGS: Filling defects are noted in the right lower lobe pulmonary arterial branches compatible with acute pulmonary emboli. Associated small right pleural effusion. Bibasilar ground-glass opacities are noted, likely atelectasis. The right atrial to left atrial ratio is normal, less than 0.9. No mediastinal, hilar, or axillary adenopathy. Chest wall soft tissues are unremarkable. Imaging into the upper abdomen shows no acute findings. No acute bony abnormality.  Review of the MIP images  confirms the above findings.  IMPRESSION: Right lower lobe pulmonary emboli with associated small right pleural effusion.  Bibasilar atelectasis.  Critical Value/emergent results were called by telephone at the time of interpretation on 07/16/2014 at 12:35 pm to Dr. Sherwood Gambler , who verbally acknowledged these results.   Electronically Signed   By: Rolm Baptise M.D.   On: 07/16/2014 12:38    EKG: Independently reviewed. Sinus with T-wave inversion V1-V3    Time spent:60 minutes Code Status:   FULL Family Communication:   Daughter updated at bedside   Nikola Marone, DO  [facility] Pager [phone]  If 7PM-7AM, please contact night-coverage [email or web address] Password [email or web address] 07/16/2014, 1:41 PM

## 2014-07-16 NOTE — Progress Notes (Signed)
UR completed 

## 2014-07-16 NOTE — ED Notes (Signed)
Pt alert, arrives from home, c/o right sided chest pain, SOB, onset was last PM, denies activity with onset, denies trauma or injury, states pain is worse with inspiration, resp even unlabored, skin pwd

## 2014-07-17 DIAGNOSIS — D638 Anemia in other chronic diseases classified elsewhere: Secondary | ICD-10-CM

## 2014-07-17 DIAGNOSIS — I2699 Other pulmonary embolism without acute cor pulmonale: Secondary | ICD-10-CM

## 2014-07-17 DIAGNOSIS — R0781 Pleurodynia: Secondary | ICD-10-CM

## 2014-07-17 LAB — CBC
HCT: 29.2 % — ABNORMAL LOW (ref 36.0–46.0)
Hemoglobin: 8.5 g/dL — ABNORMAL LOW (ref 12.0–15.0)
MCH: 18.8 pg — AB (ref 26.0–34.0)
MCHC: 29.1 g/dL — ABNORMAL LOW (ref 30.0–36.0)
MCV: 64.6 fL — AB (ref 78.0–100.0)
Platelets: 344 10*3/uL (ref 150–400)
RBC: 4.52 MIL/uL (ref 3.87–5.11)
RDW: 19.1 % — ABNORMAL HIGH (ref 11.5–15.5)
WBC: 8.8 10*3/uL (ref 4.0–10.5)

## 2014-07-17 LAB — HEPARIN LEVEL (UNFRACTIONATED): HEPARIN UNFRACTIONATED: 0.34 [IU]/mL (ref 0.30–0.70)

## 2014-07-17 LAB — PROTIME-INR
INR: 1.12 (ref 0.00–1.49)
PROTHROMBIN TIME: 14.5 s (ref 11.6–15.2)

## 2014-07-17 MED ORDER — ENOXAPARIN (LOVENOX) PATIENT EDUCATION KIT
PACK | Freq: Once | Status: AC
  Administered 2014-07-17: 10:00:00
  Filled 2014-07-17: qty 1

## 2014-07-17 MED ORDER — MORPHINE SULFATE 4 MG/ML IJ SOLN: 4.0000 mg | INTRAMUSCULAR | Status: DC | PRN

## 2014-07-17 MED ORDER — WARFARIN SODIUM 2.5 MG PO TABS
12.5000 mg | ORAL_TABLET | Freq: Once | ORAL | Status: AC
  Administered 2014-07-17: 12.5 mg via ORAL
  Filled 2014-07-17: qty 1

## 2014-07-17 MED ORDER — WARFARIN SODIUM 10 MG PO TABS
10.0000 mg | ORAL_TABLET | Freq: Once | ORAL | Status: DC
  Filled 2014-07-17: qty 1

## 2014-07-17 MED ORDER — ENOXAPARIN SODIUM 120 MG/0.8ML ~~LOC~~ SOLN
120.0000 mg | Freq: Two times a day (BID) | SUBCUTANEOUS | Status: DC
  Administered 2014-07-17 – 2014-07-18 (×3): 120 mg via SUBCUTANEOUS
  Filled 2014-07-17 (×4): qty 0.8

## 2014-07-17 NOTE — Progress Notes (Signed)
[facility] Progress Note   Donna [name] NTI:[id number] DOB: [birth year] DOA: 07/16/2014 PCP: Angelica Chessman, MD  Brief narrative: Donna [name] is a 44 y.o. female 44 year old female with a history of iron deficiency anemia, pulmonary embolus presents with one-day history of right sided chest pain and shortness of breath that began on the evening before admission. Found to have a PE- hypercoagulable w/u per hematology has been negative in the past.    Subjective: Chest pain improving. No dyspnea at rest. Receiving morphine for pain. Agrees to start Lovenox injections to allow her to go home tomorrow. 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    Pleuritic chest pain - due to PE- cont pain control    Code Status: full code Family Communication:  Disposition Plan: home DVT prophylaxis: Heparin/ Lovenox  Consultants: none  Procedures: none  Antibiotics: Anti-infectives    None         Objective: Filed Weights   07/16/14 0948 07/16/14 1416  Weight: 122.471 kg (270 lb) 123.152 kg (271 lb 8 oz)    Intake/Output Summary (Last 24 hours) at 07/17/14 1018 Last data filed at 07/17/14 0600  Gross per 24 hour  Intake  692.5 ml  Output    300 ml  Net  392.5 ml     Vitals Filed Vitals:   07/16/14 1414 07/16/14 1416 07/16/14 2107 07/17/14 0527  BP: 102/58 115/68 118/68 119/67  Pulse: 93 95 98 98  Temp:  98.4 F (36.9 C) 98.3 F (36.8 C) 98.6 F (37 C)  TempSrc:  Oral Oral Oral  Resp: 17 18 18 16   Height:  5\' 9"  (1.753 m)    Weight:  123.152 kg (271 lb 8 oz)    SpO2: 98% 100% 100% 97%    Exam: General: AAO x 3, No acute respiratory distress Lungs: Clear to auscultation  bilaterally without wheezes or crackles Cardiovascular: Regular rate and rhythm without murmur gallop or rub normal S1 and S2 Abdomen: Nontender, nondistended, soft, bowel sounds positive, no rebound, no ascites, no appreciable mass Extremities: No significant cyanosis, clubbing, or edema bilateral lower extremities  Data Reviewed: Basic Metabolic Panel:  Recent Labs Lab 07/16/14 1034  NA 137  K 4.0  CL 106  CO2 25  GLUCOSE 103*  BUN 10  CREATININE 0.87  CALCIUM 8.4   Liver Function Tests:  Recent Labs Lab 07/16/14 1034  AST 17  ALT 17  ALKPHOS 73  BILITOT 0.4  PROT 7.0  ALBUMIN 3.2*   No results for input(s): LIPASE, AMYLASE in the last 168 hours. No results for input(s): AMMONIA in the last 168 hours. CBC:  Recent Labs Lab 07/16/14 1034 07/17/14 0200  WBC 9.2 8.8  NEUTROABS 5.4  --   HGB 8.9* 8.5*  HCT 30.2* 29.2*  MCV 64.0* 64.6*  PLT 338 344   Cardiac Enzymes:  Recent Labs Lab 07/16/14 1034 07/16/14 1500 07/16/14 2036  TROPONINI <0.03 <0.03 <0.03   BNP (last 3 results) No results for input(s): PROBNP in the last 8760 hours. CBG: No results for input(s): GLUCAP in the last 168 hours.  No results found for this or any previous visit (from the past 240 hour(s)).   Studies:  Recent x-ray studies have  been reviewed in detail by the Attending Physician  Scheduled Meds:  Scheduled Meds: . enoxaparin (LOVENOX) injection  120 mg Subcutaneous Q12H  . enoxaparin   Does not apply Once  . patient's guide to using coumadin book   Does not apply Once  . sodium chloride  3 mL Intravenous Q12H  . warfarin  12.5 mg Oral ONCE-1800  . warfarin   Does not apply Once  . Warfarin - Pharmacist Dosing Inpatient   Does not apply q1800   Continuous Infusions:   Time spent on care of this patient: 35 min   [place], MD 07/17/2014, 10:18 AM  LOS: 1 day   [facility] Office  [phone] Pager - Text Page per [email or web address]  If 7PM-7AM, please  contact night-coverage [email or web address]

## 2014-07-17 NOTE — Discharge Instructions (Signed)
Information on my medicine - Coumadin   (Warfarin)  This medication education was reviewed with me or my healthcare representative as part of my discharge preparation.  The pharmacist that spoke with me during my hospital stay was:  Emiliano Dyer, [facility]  Why was Coumadin prescribed for you? Coumadin was prescribed for you because you have a blood clot or a medical condition that can cause an increased risk of forming blood clots. Blood clots can cause serious health problems by blocking the flow of blood to the heart, lung, or brain. Coumadin can prevent harmful blood clots from forming. As a reminder your indication for Coumadin is:   Pulmonary Embolism Treatment  What test will check on my response to Coumadin? While on Coumadin (warfarin) you will need to have an INR test regularly to ensure that your dose is keeping you in the desired range. The INR (international normalized ratio) number is calculated from the result of the laboratory test called prothrombin time (PT).  If an INR APPOINTMENT HAS NOT ALREADY BEEN MADE FOR YOU please schedule an appointment to have this lab work done by your health care provider within 7 days. Your INR goal is usually a number between:  2 to 3 or your provider may give you a more narrow range like 2-2.5.  Ask your health care provider during an office visit what your goal INR is.  What  do you need to  know  About  COUMADIN? Take Coumadin (warfarin) exactly as prescribed by your healthcare provider about the same time each day.  DO NOT stop taking without talking to the doctor who prescribed the medication.  Stopping without other blood clot prevention medication to take the place of Coumadin may increase your risk of developing a new clot or stroke.  Get refills before you run out.  What do you do if you miss a dose? If you miss a dose, take it as soon as you remember on the same day then continue your regularly scheduled regimen the next day.  Do not take  two doses of Coumadin at the same time.  Important Safety Information A possible side effect of Coumadin (Warfarin) is an increased risk of bleeding. You should call your healthcare provider right away if you experience any of the following: ? Bleeding from an injury or your nose that does not stop. ? Unusual colored urine (red or dark brown) or unusual colored stools (red or black). ? Unusual bruising for unknown reasons. ? A serious fall or if you hit your head (even if there is no bleeding).  Some foods or medicines interact with Coumadin (warfarin) and might alter your response to warfarin. To help avoid this: ? Eat a balanced diet, maintaining a consistent amount of Vitamin K. ? Notify your provider about major diet changes you plan to make. ? Avoid alcohol or limit your intake to 1 drink for women and 2 drinks for men per day. (1 drink is 5 oz. wine, 12 oz. beer, or 1.5 oz. liquor.)  Make sure that ANY health care provider who prescribes medication for you knows that you are taking Coumadin (warfarin).  Also make sure the healthcare provider who is monitoring your Coumadin knows when you have started a new medication including herbals and non-prescription products.  Coumadin (Warfarin)  Major Drug Interactions  Increased Warfarin Effect Decreased Warfarin Effect  Alcohol (large quantities) Antibiotics (esp. Septra/Bactrim, Flagyl, Cipro) Amiodarone (Cordarone) Aspirin (ASA) Cimetidine (Tagamet) Megestrol (Megace) NSAIDs (ibuprofen, naproxen, etc.) Piroxicam (  Feldene) °Propafenone (Rythmol SR) °Propranolol (Inderal) °Isoniazid (INH) °Posaconazole (Noxafil) Barbiturates (Phenobarbital) °Carbamazepine (Tegretol) °Chlordiazepoxide (Librium) °Cholestyramine (Questran) °Griseofulvin °Oral Contraceptives °Rifampin °Sucralfate (Carafate) °Vitamin K  ° °Coumadin® (Warfarin) Major Herbal Interactions  °Increased Warfarin Effect Decreased Warfarin Effect  °Garlic °Ginseng °Ginkgo biloba  Coenzyme Q10 °Green tea °St. John’s wort   ° °Coumadin® (Warfarin) FOOD Interactions  °Eat a consistent number of servings per week of foods HIGH in Vitamin K °(1 serving = ½ cup)  °Collards (cooked, or boiled & drained) °Kale (cooked, or boiled & drained) °Mustard greens (cooked, or boiled & drained) °Parsley *serving size only = ¼ cup °Spinach (cooked, or boiled & drained) °Swiss chard (cooked, or boiled & drained) °Turnip greens (cooked, or boiled & drained)  °Eat a consistent number of servings per week of foods MEDIUM-HIGH in Vitamin K °(1 serving = 1 cup)  °Asparagus (cooked, or boiled & drained) °Broccoli (cooked, boiled & drained, or raw & chopped) °Brussel sprouts (cooked, or boiled & drained) *serving size only = ½ cup °Lettuce, raw (green leaf, endive, romaine) °Spinach, raw °Turnip greens, raw & chopped  ° °These websites have more information on Coumadin (warfarin):  www.coumadin.com; °www.ahrq.gov/consumer/coumadin.htm; ° ° °

## 2014-07-17 NOTE — Progress Notes (Signed)
Brief pharmacy note:  IV Heparin  In brief, 57 yoF on IV heparin / warfarin bridge for new PE. Heparin level therapeutic x2  (goal 0.3-0.7).  No issues per RN with infusion and no s/sxs bleeding.   Plan:  Continue heparin infusion at 1750 units/hr.   Daily HL/CBC  Dorrene German 07/17/2014, 3:37 AM

## 2014-07-17 NOTE — Progress Notes (Signed)
Bilateral lower extremity venous duplex completed:  No evidence of DVT, superficial thrombosis, or Baker's cyst.   

## 2014-07-17 NOTE — Progress Notes (Addendum)
ANTICOAGULATION CONSULT NOTE - Follow Up Consult  Pharmacy Consult for Heparin/Warfarin Indication: pulmonary embolus  No Known Allergies  Patient Measurements: Height: 5\' 9"  (175.3 cm) Weight: 271 lb 8 oz (123.152 kg) IBW/kg (Calculated) : 66.2 Heparin Dosing Weight: 96.7 kg  Vital Signs: Temp: 98.6 F (37 C) (01/01 0527) Temp Source: Oral (01/01 0527) BP: 119/67 mmHg (01/01 0527) Pulse Rate: 98 (01/01 0527)  Labs:  Recent Labs  07/16/14 1034 07/16/14 1500 07/16/14 2036 07/17/14 0200  HGB 8.9*  --   --  8.5*  HCT 30.2*  --   --  29.2*  PLT 338  --   --  344  APTT 29  --   --   --   LABPROT 14.3  --   --  14.5  INR 1.10  --   --  1.12  HEPARINUNFRC  --  0.35 0.36 0.34  CREATININE 0.87  --   --   --   TROPONINI <0.03 <0.03 <0.03  --     Estimated Creatinine Clearance: 117.1 mL/min (by C-G formula based on Cr of 0.87).   Medications:  Scheduled:  . patient's guide to using coumadin book   Does not apply Once  . sodium chloride  3 mL Intravenous Q12H  . warfarin   Does not apply Once  . Warfarin - Pharmacist Dosing Inpatient   Does not apply q1800   Infusions:  . heparin 1,750 Units/hr (07/17/14 0600)   PRN: acetaminophen **OR** acetaminophen, morphine injection, ondansetron **OR** ondansetron (ZOFRAN) IV, oxyCODONE  Assessment: 44 yo female presented to ER with SOB since last PM. Per CT, patient found to have new PE. Patient with hx PE last year likely due to ankle injury. To start IV heparin per pharmacy dosing. Physician discussed oral options for anticoagulation and patient wishes to start warfarin.   Today, 07/17/2014  Heparin levels therapeutic x 2 on 1750 units/hr  INR unchanged as expected after 1st dose of warfarin 12.5mg  last PM  Hgb low (8.5) slightly decreased from 8.9 yesterday; Plts WNL  No bleeding reported per chart notes  Noted history of heavy menstrual cycles  No drug interactions noted  Goal of Therapy:  Heparin level 0.3-0.7  units/ml Monitor platelets by anticoagulation protocol: Yes   Plan:   Continue heparin infusion 1750 units/hr  Repeat Warfarin 12.5mg  PO x 1 today at 18:00  Daily heparin level, INR Provided warfarin education to patient including indication, adverse effects, importance of INR monitoring, food & drug interactions.  Also stressed importance of contraception while on therapy - she stated this is not an issue for her. She reports her only bleeding issue with prior warfarin therapy was heavy menstrual cycles. She recalls that she required doses as high as 15mg  at one point during her therapy.  If no invasive procedures planned, recommend changing heparin to lovenox 1mg /kg sq q12h as this is preferred over heparin per CHEST guidelines.  Peggyann Juba, PharmD, BCPS Pager: [phone] 07/17/2014 8:26 AM

## 2014-07-17 NOTE — Progress Notes (Signed)
  Echocardiogram 2D Echocardiogram has been performed.  Donna [name] 07/17/2014, 10:20 AM

## 2014-07-17 NOTE — Progress Notes (Signed)
ANTICOAGULATION CONSULT NOTE - Initial Consult  Pharmacy Consult for Lovenox Indication: pulmonary embolus  No Known Allergies  Patient Measurements: Height: 5' 9" (175.3 cm) Weight: 271 lb 8 oz (123.152 kg) IBW/kg (Calculated) : 66.2  Vital Signs: Temp: 98.6 F (37 C) (01/01 0527) Temp Source: Oral (01/01 0527) BP: 119/67 mmHg (01/01 0527) Pulse Rate: 98 (01/01 0527)  Labs:  Recent Labs  07/16/14 1034 07/16/14 1500 07/16/14 2036 07/17/14 0200  HGB 8.9*  --   --  8.5*  HCT 30.2*  --   --  29.2*  PLT 338  --   --  344  APTT 29  --   --   --   LABPROT 14.3  --   --  14.5  INR 1.10  --   --  1.12  HEPARINUNFRC  --  0.35 0.36 0.34  CREATININE 0.87  --   --   --   TROPONINI <0.03 <0.03 <0.03  --     Estimated Creatinine Clearance: 117.1 mL/min (by C-G formula based on Cr of 0.87).   Medications:  Scheduled:  . enoxaparin (LOVENOX) injection  120 mg Subcutaneous Q12H  . enoxaparin   Does not apply Once  . patient's guide to using coumadin book   Does not apply Once  . sodium chloride  3 mL Intravenous Q12H  . warfarin  10 mg Oral ONCE-1800  . warfarin   Does not apply Once  . Warfarin - Pharmacist Dosing Inpatient   Does not apply q1800   Infusions:  . heparin 1,750 Units/hr (07/17/14 0600)   Assessment: 44 yo female presented to ER with SOB since evening before admission. Per CT, patient found to have new PE. Patient with hx PE last year likely due to ankle injury. Patient initially started on IV heparin for bridge, now being transitioned to SQ Lovenox. Physician discussed oral options for anticoagulation and patient wishes to start warfarin.   Today, 07/17/2014  Heparin levels therapeutic x 2 on IV heparin at 1750 units/hr  INR unchanged as expected after 1st dose of warfarin 12.48m last PM  Hgb low (8.5) slightly decreased from 8.9 yesterday; Pltc WNL  SCr 0.87 (from 12/31), CrCl > 100 mL/min  No bleeding reported per chart notes  Noted history of  heavy menstrual cycles  No drug interactions noted  Goal of Therapy:  INR 2-3 Monitor platelets by anticoagulation protocol: Yes    Plan:   D/C heparin drip at 0900.  Start Lovenox 120 mg (1 mg/kg) SQ q12h at 1000.  Provide Lovenox teaching kit to patient.  Monitor renal function and for signs and symptoms of bleeding.  See previous pharmacy note from EPeggyann Juba PharmD, for full details regarding Warfarin dosing.    JLindell Spar PharmD, BCPS Pager: [phone]/07/2014 8:53 AM

## 2014-07-18 LAB — URINALYSIS, ROUTINE W REFLEX MICROSCOPIC
BILIRUBIN URINE: NEGATIVE
Glucose, UA: NEGATIVE mg/dL
HGB URINE DIPSTICK: NEGATIVE
Ketones, ur: NEGATIVE mg/dL
LEUKOCYTES UA: NEGATIVE
Nitrite: NEGATIVE
PROTEIN: NEGATIVE mg/dL
SPECIFIC GRAVITY, URINE: 1.013 (ref 1.005–1.030)
Urobilinogen, UA: 1 mg/dL (ref 0.0–1.0)
pH: 7 (ref 5.0–8.0)

## 2014-07-18 LAB — PROTIME-INR
INR: 1.47 (ref 0.00–1.49)
PROTHROMBIN TIME: 18 s — AB (ref 11.6–15.2)

## 2014-07-18 LAB — CBC
HCT: 28.2 % — ABNORMAL LOW (ref 36.0–46.0)
HEMOGLOBIN: 8.1 g/dL — AB (ref 12.0–15.0)
MCH: 18.4 pg — AB (ref 26.0–34.0)
MCHC: 28.7 g/dL — ABNORMAL LOW (ref 30.0–36.0)
MCV: 64.1 fL — AB (ref 78.0–100.0)
Platelets: 335 10*3/uL (ref 150–400)
RBC: 4.4 MIL/uL (ref 3.87–5.11)
RDW: 18.7 % — AB (ref 11.5–15.5)
WBC: 5.9 10*3/uL (ref 4.0–10.5)

## 2014-07-18 MED ORDER — ENOXAPARIN SODIUM 120 MG/0.8ML ~~LOC~~ SOLN: 120.0000 mg | Freq: Two times a day (BID) | SUBCUTANEOUS | Status: DC

## 2014-07-18 MED ORDER — WARFARIN SODIUM 2.5 MG PO TABS: 10.0000 mg | ORAL_TABLET | Freq: Every day | ORAL | Status: DC

## 2014-07-18 MED ORDER — OXYCODONE-ACETAMINOPHEN 5-325 MG PO TABS: 1.0000 | ORAL_TABLET | ORAL | Status: DC | PRN

## 2014-07-18 MED ORDER — WARFARIN SODIUM 2.5 MG PO TABS
12.5000 mg | ORAL_TABLET | Freq: Once | ORAL | Status: DC
  Filled 2014-07-18: qty 1

## 2014-07-18 NOTE — Progress Notes (Signed)
ANTICOAGULATION CONSULT NOTE - Follow Up Consult  Pharmacy Consult for enoxaparin/warfarin Indication: pulmonary embolus  No Known Allergies  Patient Measurements: Height: 5\' 9"  (175.3 cm) Weight: 271 lb 8 oz (123.152 kg) IBW/kg (Calculated) : 66.2 Heparin Dosing Weight: 96.7 kg  Vital Signs: Temp: 98.7 F (37.1 C) (01/02 0613) Temp Source: Oral (01/02 0613) BP: 123/81 mmHg (01/02 0613) Pulse Rate: 85 (01/02 0613)  Labs:  Recent Labs  07/16/14 1034 07/16/14 1500 07/16/14 2036 07/17/14 0200 07/18/14 0550  HGB 8.9*  --   --  8.5* 8.1*  HCT 30.2*  --   --  29.2* 28.2*  PLT 338  --   --  344 335  APTT 29  --   --   --   --   LABPROT 14.3  --   --  14.5 18.0*  INR 1.10  --   --  1.12 1.47  HEPARINUNFRC  --  0.35 0.36 0.34  --   CREATININE 0.87  --   --   --   --   TROPONINI <0.03 <0.03 <0.03  --   --     Estimated Creatinine Clearance: 117.1 mL/min (by C-G formula based on Cr of 0.87).   Medications:  Scheduled:  . enoxaparin (LOVENOX) injection  120 mg Subcutaneous Q12H  . patient's guide to using coumadin book   Does not apply Once  . sodium chloride  3 mL Intravenous Q12H  . warfarin   Does not apply Once  . Warfarin - Pharmacist Dosing Inpatient   Does not apply q1800   Infusions:    PRN: acetaminophen **OR** acetaminophen, morphine injection, ondansetron **OR** ondansetron (ZOFRAN) IV, oxyCODONE  Assessment: 44 yo female presented to ER 12/31 with SOB for 1 day PTA. Per CT, patient found to have new PE. Patient with hx PE last year likely due to ankle injury. Originally started on IV heparin per pharmacy dosing, now transitioned to enoxparin. Physician discussed oral options for anticoagulation and patient wishes to start warfarin.   Today, 07/18/2014,   Day 3/5 minimum required overlap of parenteral anticoagulation and warfarin  Patient continues on enoxaparin 120mg  SQ BID  INR increased to 1.47, but remains below therapeutic range  S/p 12.5mg  PO x2  days  Hgb low (8.1) slightly decreased since admit; Plts remain WNL  CrCl > 100 ml/min  No bleeding reported per chart notes  Noted history of heavy menstrual cycles  No drug interactions noted  Patient on regular diet- eating 100% of meals  During education session by PharmD performed on 07/17/14:  Provided warfarin education to patient including indication, adverse effects, importance of INR monitoring, food & drug interactions  Stressed importance of contraception while on therapy  Patient reports only bleeding issue with prior warfarin therapy was heavy menstrual cycles  Patient recalls requiring doses as high as 15mg  at one point during prior therapy  Goal of Therapy:  Heparin level 0.3-0.7 units/ml Monitor platelets by anticoagulation protocol: Yes   Plan:   Continue enoxaparin 120mg  SQ BID  Repeat Warfarin 12.5mg  PO x 1 today at 18:00  Daily INR, CBC at least q72h, bmet at least q72h  Thank you for the consult.  Currie Paris, PharmD, BCPS Pager: [phone] Pharmacy: [phone] 07/18/2014 7:38 AM

## 2014-07-18 NOTE — Progress Notes (Signed)
CARE MANAGEMENT NOTE 07/18/2014  Patient:  NOLAH, KRENZER   Account Number:  [id number]  Date Initiated:  07/18/2014  Documentation initiated by:  [facility]  Subjective/Objective Assessment:   PE     Action/Plan:   Anticipated DC Date:  07/18/2014   Anticipated DC Plan:  [place]  CM consult  [facility]      Choice offered to / List presented to:             Status of service:  Completed, signed off Medicare Important Message given?   (If response is "NO", the following Medicare IM given date fields will be blank) Date Medicare IM given:   Medicare IM given by:   Date Additional Medicare IM given:   Additional Medicare IM given by:    Discharge Disposition:  HOME/SELF CARE  Per UR Regulation:    If discussed at Long Length of Stay Meetings, dates discussed:    Comments:  07/18/2014 1100 NCM spoke to pt and states she does not have insurance coverage. Provided pt with MATCH to pick up meds for $3. Explained program can be used only once per year. Provided pt with brochure to follow up at [facility] for appt on Tues. Jonnie Finner RN CCM Case Mgmt phone [phone]

## 2014-07-18 NOTE — Progress Notes (Signed)
SATURATION QUALIFICATIONS: (This note is used to comply with regulatory documentation for home oxygen)  Patient Saturations on Room Air at Rest = 99%  Patient Saturations on Room Air while Ambulating = 94%-97%  Patient Saturations on Liters of oxygen while Ambulating =   Please briefly explain why patient needs home oxygen:

## 2014-07-18 NOTE — Discharge Summary (Addendum)
Physician Discharge Summary  Donna [name] BJY:[id number] DOB: 07-10-1971 DOA: 07/16/2014  PCP: Angelica Chessman, MD  Admit date: 07/16/2014 Discharge date: 07/18/2014  Time spent: 45 minutes  Recommendations for Outpatient Follow-up:  1. Continue to follow up with [facility] and wellness clinic-INR to be done this coming Tuesday  Discharge Condition: Stable Diet recommendation: Heart healthy  Discharge Diagnoses:  Principal Problem:   Acute pulmonary embolism Active Problems:   Anemia   Iron deficiency anemia   Pleuritic chest pain   History of present illness:  Donna [name] is a 44 y.o. female 44 year old female with a history of iron deficiency anemia, pulmonary embolus presents with one-day history of right sided chest pain and shortness of breath that began on the evening before admission. Found to have a PE- hypercoagulable w/u per hematology has been negative in the past.   Hospital Course:  Principal Problem:  Acute pulmonary embolism - transition to Lovenox- case management consult to assist with cost - cont Coumadin which is what the patient is preferring to take instead of NOACs - INR to be checked in 3 days  -  ECHO was a poor study but may show some RV dilatation-LV within normal limits =- see report below - LE venous duplex does not reveal any evidence of a DVT - Troponin negative  Active Problems: AOCD - previous blood work from 2014 consistent with this   Pleuritic chest pain - due to PE-pain has resolved  Procedures:  ECHO Left ventricle: The cavity size was normal. Systolic function was normal. The estimated ejection fraction was in the range of 60% to 65%. Wall motion was normal; there were no regional wall motion abnormalities. - Right ventricle: Poorly visualized. The cavity size was mildly dilated. Wall thickness was normal.  Consultations:  none  Discharge Exam: Filed Weights   07/16/14 0948 07/16/14 1416   Weight: 122.471 kg (270 lb) 123.152 kg (271 lb 8 oz)   Filed Vitals:   07/18/14 1443  BP: 104/49  Pulse: 96  Temp: 97.8 F (36.6 C)  Resp: 18    General: AAO x 3, no distress Cardiovascular: RRR, no murmurs  Respiratory: clear to auscultation bilaterally GI: soft, non-tender, non-distended, bowel sound positive  Discharge Instructions You were cared for by a hospitalist during your hospital stay. If you have any questions about your discharge medications or the care you received while you were in the hospital after you are discharged, you can call the unit and asked to speak with the hospitalist on call if the hospitalist that took care of you is not available. Once you are discharged, your primary care physician will handle any further medical issues. Please note that NO REFILLS for any discharge medications will be authorized once you are discharged, as it is imperative that you return to your primary care physician (or establish a relationship with a primary care physician if you do not have one) for your aftercare needs so that they can reassess your need for medications and monitor your lab values.      Discharge Instructions    Diet - low sodium heart healthy    Complete by:  As directed      Discharge instructions    Complete by:  As directed   Have INR done on Tues     Increase activity slowly    Complete by:  As directed             Medication List    STOP taking  these medications        sulfamethoxazole-trimethoprim 800-160 MG per tablet  Commonly known as:  SEPTRA DS      TAKE these medications        BC FAST PAIN RELIEF PO  Take 1 packet by mouth every 4 (four) hours as needed (for pain).     bisacodyl 5 MG EC tablet  Commonly known as:  DULCOLAX  Take 1 tablet (5 mg total) by mouth daily.     enoxaparin 120 MG/0.8ML injection  Commonly known as:  LOVENOX  Inject 0.8 mLs (120 mg total) into the skin every 12 (twelve) hours.      oxyCODONE-acetaminophen 5-325 MG per tablet  Commonly known as:  PERCOCET/ROXICET  Take 1 tablet by mouth every 4 (four) hours as needed for severe pain.     warfarin 2.5 MG tablet  Commonly known as:  COUMADIN  Take 4 tablets (10 mg total) by mouth daily.       No Known Allergies Follow-up Information    Follow up with [facility]    .   Why:  please call on 07/20/2014 to arrange appointment    Contact information:   [address] [place] [address] [phone]       The results of significant diagnostics from this hospitalization (including imaging, microbiology, ancillary and laboratory) are listed below for reference.    Significant Diagnostic Studies: Dg Chest 2 View  07/16/2014   CLINICAL DATA:  Right-sided chest pain and difficulty breathing for 1 day  EXAM: CHEST  2 VIEW  COMPARISON:  Chest radiograph Dec 04, 2012; chest CT July 23, 2013  FINDINGS: There is mild bibasilar lung scarring. There is no edema or consolidation. The heart size and pulmonary vascularity are normal. No adenopathy. There are surgical clips in the left upper hemithorax region.  IMPRESSION: Bibasilar lung scarring.  No edema or consolidation.   Electronically Signed   By: Lowella Grip M.D.   On: 07/16/2014 10:27   Ct Angio Chest Pe W/cm &/or Wo Cm  07/16/2014   CLINICAL DATA:  Right-sided chest pain, shortness of breath, onset last night. Pain worse with inspiration.  EXAM: CT ANGIOGRAPHY CHEST WITH CONTRAST  TECHNIQUE: Multidetector CT imaging of the chest was performed using the standard protocol during bolus administration of intravenous contrast. Multiplanar CT image reconstructions and MIPs were obtained to evaluate the vascular anatomy.  CONTRAST:  173mL OMNIPAQUE IOHEXOL 350 MG/ML SOLN  COMPARISON:  07/23/2013  FINDINGS: Filling defects are noted in the right lower lobe pulmonary arterial branches compatible with acute pulmonary emboli.  Associated small right pleural effusion. Bibasilar ground-glass opacities are noted, likely atelectasis. The right atrial to left atrial ratio is normal, less than 0.9. No mediastinal, hilar, or axillary adenopathy. Chest wall soft tissues are unremarkable. Imaging into the upper abdomen shows no acute findings. No acute bony abnormality.  Review of the MIP images confirms the above findings.  IMPRESSION: Right lower lobe pulmonary emboli with associated small right pleural effusion.  Bibasilar atelectasis.  Critical Value/emergent results were called by telephone at the time of interpretation on 07/16/2014 at 12:35 pm to Dr. Sherwood Gambler , who verbally acknowledged these results.   Electronically Signed   By: Rolm Baptise M.D.   On: 07/16/2014 12:38    Microbiology: No results found for this or any previous visit (from the past 240 hour(s)).   Labs: Basic Metabolic Panel:  Recent Labs Lab 07/16/14 1034  NA 137  K 4.0  CL 106  CO2 25  GLUCOSE 103*  BUN 10  CREATININE 0.87  CALCIUM 8.4   Liver Function Tests:  Recent Labs Lab 07/16/14 1034  AST 17  ALT 17  ALKPHOS 73  BILITOT 0.4  PROT 7.0  ALBUMIN 3.2*   No results for input(s): LIPASE, AMYLASE in the last 168 hours. No results for input(s): AMMONIA in the last 168 hours. CBC:  Recent Labs Lab 07/16/14 1034 07/17/14 0200 07/18/14 0550  WBC 9.2 8.8 5.9  NEUTROABS 5.4  --   --   HGB 8.9* 8.5* 8.1*  HCT 30.2* 29.2* 28.2*  MCV 64.0* 64.6* 64.1*  PLT 338 344 335   Cardiac Enzymes:  Recent Labs Lab 07/16/14 1034 07/16/14 1500 07/16/14 2036  TROPONINI <0.03 <0.03 <0.03   BNP: BNP (last 3 results) No results for input(s): PROBNP in the last 8760 hours. CBG: No results for input(s): GLUCAP in the last 168 hours.     SignedDebbe Odea, MD [facility] 07/18/2014, 3:32 PM

## 2014-07-19 LAB — IRON AND TIBC: UIBC: 364 ug/dL (ref 125–400)

## 2014-07-19 LAB — HIV ANTIBODY (ROUTINE TESTING W REFLEX): HIV 1&2 Ab, 4th Generation: NONREACTIVE

## 2014-07-19 LAB — FERRITIN: Ferritin: 8 ng/mL — ABNORMAL LOW (ref 10–291)

## 2014-07-21 ENCOUNTER — Ambulatory Visit: Payer: Self-pay | Attending: Internal Medicine | Admitting: Pharmacist

## 2014-07-21 DIAGNOSIS — I82409 Acute embolism and thrombosis of unspecified deep veins of unspecified lower extremity: Secondary | ICD-10-CM

## 2014-07-21 DIAGNOSIS — Z7901 Long term (current) use of anticoagulants: Secondary | ICD-10-CM

## 2014-07-21 DIAGNOSIS — I2699 Other pulmonary embolism without acute cor pulmonale: Secondary | ICD-10-CM

## 2014-07-21 LAB — POCT INR: INR: 1.6

## 2014-07-21 NOTE — Progress Notes (Signed)
Just received call from Case Mgmnet dept for d/c patient on MD f/u appt @ [facility] for pt/inr that is due today.Spoke to d/c patient on phone c#[phone] about appt.[facility] [place] appt scheduler for appt today @ 2:45p. Patient voiced understanding.

## 2014-07-21 NOTE — Care Management Note (Signed)
    Page 1 of 1   07/21/2014     4:25:53 PM CARE MANAGEMENT NOTE 07/21/2014  Patient:  Donna, [name]   Account Number:  [id number]  Date Initiated:  07/18/2014  Documentation initiated by:  [facility]  Subjective/Objective Assessment:   PE     Action/Plan:   Anticipated DC Date:  07/18/2014   Anticipated DC Plan:  [place]  CM consult  [facility]      Choice offered to / List presented to:             Status of service:  Completed, signed off Medicare Important Message given?   (If response is "NO", the following Medicare IM given date fields will be blank) Date Medicare IM given:   Medicare IM given by:   Date Additional Medicare IM given:   Additional Medicare IM given by:    Discharge Disposition:  HOME/SELF CARE  Per UR Regulation:    If discussed at Long Length of Stay Meetings, dates discussed:    Comments:  07/21/14 Dessa Phi RN BSN NCM [phone] Received call from case mgmnt dept secy about d/c patient's pt/inr appt.Patient was transferred to me:I contacted [facility] to see patient today, appt set for 07/21/14 @ 2:45p.Patient voiced understanding to come to [facility] today for pt/inr.  07/18/2014 1100 NCM spoke to pt and states she does not have insurance coverage. Provided pt with MATCH to pick up meds for $3. Explained program can be used only once per year. Provided pt with brochure to follow up at [facility] for appt on Tues. Jonnie Finner RN CCM Case Mgmt phone [phone]

## 2014-07-27 ENCOUNTER — Ambulatory Visit: Payer: Medicaid Other | Attending: Family Medicine | Admitting: Family Medicine

## 2014-07-27 ENCOUNTER — Encounter: Payer: Self-pay | Admitting: Family Medicine

## 2014-07-27 ENCOUNTER — Ambulatory Visit: Payer: Medicaid Other | Attending: Internal Medicine | Admitting: Pharmacist

## 2014-07-27 VITALS — BP 124/81 | HR 105 | Temp 98.0°F | Resp 16 | Ht 70.0 in | Wt 266.0 lb

## 2014-07-27 DIAGNOSIS — I2699 Other pulmonary embolism without acute cor pulmonale: Secondary | ICD-10-CM | POA: Insufficient documentation

## 2014-07-27 DIAGNOSIS — Z86718 Personal history of other venous thrombosis and embolism: Secondary | ICD-10-CM | POA: Insufficient documentation

## 2014-07-27 DIAGNOSIS — Z7901 Long term (current) use of anticoagulants: Secondary | ICD-10-CM

## 2014-07-27 DIAGNOSIS — I82403 Acute embolism and thrombosis of unspecified deep veins of lower extremity, bilateral: Secondary | ICD-10-CM

## 2014-07-27 LAB — POCT INR: INR: 1.1

## 2014-07-27 MED ORDER — ENOXAPARIN SODIUM 120 MG/0.8ML ~~LOC~~ SOLN: 120.0000 mg | Freq: Two times a day (BID) | SUBCUTANEOUS | Status: DC

## 2014-07-27 MED ORDER — WARFARIN SODIUM 2.5 MG PO TABS: 10.0000 mg | ORAL_TABLET | Freq: Once | ORAL | Status: DC

## 2014-07-27 MED ORDER — ACETAMINOPHEN-CODEINE #3 300-30 MG PO TABS: 1.0000 | ORAL_TABLET | Freq: Four times a day (QID) | ORAL | Status: DC | PRN

## 2014-07-27 MED ORDER — RIVAROXABAN 15 MG PO TABS: 15.0000 mg | ORAL_TABLET | Freq: Two times a day (BID) | ORAL | Status: DC

## 2014-07-27 MED ORDER — RIVAROXABAN 20 MG PO TABS: 20.0000 mg | ORAL_TABLET | Freq: Every day | ORAL | Status: DC

## 2014-07-27 NOTE — Progress Notes (Signed)
HPI  44 year old female presents with right-sided chest pain and shortness of breath since last night. Started around 10 PM. Significantly worsened this morning. No trauma. Patient rates the pain as severe. Worse with inspiration or lying flat. Worse with certain types of movements. Patient had similar pain when she had her pulmonary embolism last year that was it should be noted due to an ankle injury. Denies fevers or congestion. Mild dry cough that mostly occurs when she becomes short of breath. Denies any new leg swelling or leg pain.  Pt comes in to establish care for right pulmonary embolism  Pt started on Lovenox injection 120 mg tab- states she completed 3 dys ago C/o right ribcage pain with deep breathing that started today upon arrival Tachycardic 105 denies chest pain or sob INR- sub-therapeutic 1.1

## 2014-07-27 NOTE — Patient Instructions (Addendum)
Donna [name],  This is the second clot you have had. #1 right leg  #2 R lung   After 2 clots you must be anticoagulated lifelong. Please stop coumadin. Start xarelto 15 mg twice [name] for 21 days then 20 mg [name].  Referral to hematology.     Dr. Adrian Blackwater

## 2014-07-27 NOTE — Progress Notes (Signed)
   Subjective:    Patient ID: Donna [name], female    DOB: Nov 20, 1970, 44 y.o.   MRN: [id number] CC: HFU for PE  HPI 43 yo F re-establish care, hx of RLE DVT:  1. PE: acute R sided PE. On coumadin-lovenox bridge. Out of lovenox for 3 days. Previously required 15 mg of coumadin to reach goal INR. Was seen by heme-onc following last R LE DVT. It was determined, based on serial labs on and off coumadin that she did not have lupus anticoagulant (chart reviewed). Today she denies bleeding. She admits to mild R sided CP and SOB. She would prefer to stay on coumadin but cannot afford the lovenox to complete the bridge.    Soc Hx: non smoker  Med Hx: R sided DVT in 2014  Fam Hx:  Negative for DVT/PE  Review of Systems As per HPI    Objective:   Physical Exam BP 124/81 mmHg  Pulse 105  Temp(Src) 98 F (36.7 C) (Oral)  Resp 16  SpO2 100%  LMP 07/10/2014 General appearance: alert, cooperative and no distress Lungs: clear to auscultation bilaterally Heart: regular rate and rhythm, S1, S2 normal, no murmur, click, rub or gallop Extremities: extremities normal, atraumatic, no cyanosis or edema   Lab Results  Component Value Date   INR 1.1 07/27/2014   INR 1.6 07/21/2014   INR 1.47 07/18/2014       Assessment & Plan:

## 2014-07-27 NOTE — Assessment & Plan Note (Deleted)
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 Start xarelto 15 mg twice daily for 21 days then 20 mg daily.  Referral to hematology.

## 2014-07-27 NOTE — Assessment & Plan Note (Signed)
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 Start xarelto 15 mg twice daily for 21 days then 20 mg daily.  Referral to hematology.

## 2014-07-29 ENCOUNTER — Telehealth: Payer: Self-pay | Admitting: *Deleted

## 2014-07-29 ENCOUNTER — Telehealth: Payer: Self-pay | Admitting: Hematology

## 2014-07-29 NOTE — Telephone Encounter (Signed)
Pt stated been having migraine since starting taking Xarelto

## 2014-07-29 NOTE — Telephone Encounter (Signed)
S/W PATIENT AND GAVE APPT FOR 1/27 @ 8:30 W/DR. FENG. FORMER PATIENT OF DR. Juliann Mule R/S APPT FRPM 07/01/13 TO 01/27 @ 8:30

## 2014-07-29 NOTE — Telephone Encounter (Signed)
Patient advised to continue xarelto Rest. Take tylenol #3 if headache severe. She needs f/u appt for further evaluation.

## 2014-07-30 ENCOUNTER — Ambulatory Visit: Payer: Medicaid Other | Attending: Family Medicine | Admitting: Family Medicine

## 2014-07-30 ENCOUNTER — Telehealth: Payer: Self-pay | Admitting: Family Medicine

## 2014-07-30 ENCOUNTER — Encounter: Payer: Self-pay | Admitting: Family Medicine

## 2014-07-30 VITALS — BP 115/77 | HR 112 | Temp 98.1°F | Resp 16 | Ht 70.0 in | Wt 266.0 lb

## 2014-07-30 DIAGNOSIS — H5711 Ocular pain, right eye: Secondary | ICD-10-CM | POA: Insufficient documentation

## 2014-07-30 DIAGNOSIS — I2699 Other pulmonary embolism without acute cor pulmonale: Secondary | ICD-10-CM | POA: Diagnosis not present

## 2014-07-30 DIAGNOSIS — R51 Headache: Secondary | ICD-10-CM | POA: Insufficient documentation

## 2014-07-30 DIAGNOSIS — Z7901 Long term (current) use of anticoagulants: Secondary | ICD-10-CM | POA: Insufficient documentation

## 2014-07-30 LAB — POCT INR: INR: 1

## 2014-07-30 MED ORDER — WARFARIN SODIUM 10 MG PO TABS: 10.0000 mg | ORAL_TABLET | Freq: Once | ORAL | Status: DC

## 2014-07-30 MED ORDER — ENOXAPARIN SODIUM 100 MG/ML ~~LOC~~ SOLN: 1.0000 mg/kg | Freq: Two times a day (BID) | SUBCUTANEOUS | Status: DC

## 2014-07-30 NOTE — Assessment & Plan Note (Addendum)
A: pain with xarelto which has resolved since patient stopped xarelto 2 days ago. Normal ocular exam including fundoscopic exam w/o pain currently.  P:  Restart coumadin with lovenox bridge Imaging: not ordered since symptoms resolved and patient's exam is normal.

## 2014-07-30 NOTE — Telephone Encounter (Signed)
Pt advised to make a F/U appointment with PCP for for further evaluation.

## 2014-07-30 NOTE — Progress Notes (Signed)
Xarelto started on [place] Pt. Reports severe headache 10/10 starting on Tuesday Pt. Took last does of Xarelto Wednesday am and has not taken a pill today She reports no headache today

## 2014-07-30 NOTE — Progress Notes (Signed)
   Subjective:    Patient ID: Donna [name], female    DOB: 08-05-1970, 44 y.o.   MRN: [id number] CC: severe R temple headache HPI 44 yo R with recent PE:  1. R temple headache: 3 days ago. Now resolved. Severe R temple and behind R eye. Not responsive to narcotic pain medicine. Patient stopped xarelto. No HA x 2 days. No bleeding. No weakness or numbness on R or L side.   Soc Hx: non smoker  Review of Systems As per HPI     Objective:   Physical Exam BP 115/77 mmHg  Pulse 112  Temp(Src) 98.1 F (36.7 C)  Resp 16  Ht 5\' 10"  (1.778 m)  Wt 266 lb (120.657 kg)  BMI 38.17 kg/m2  SpO2 100%  LMP 07/17/2014 General appearance: alert, cooperative and no distress Head: Normocephalic, without obvious abnormality, atraumatic Eyes: conjunctivae/corneas clear. PERRL, EOM's intact. Fundi benign.  Lungs: normal WOB, CTA b/l    Lab Results  Component Value Date   INR 1.0 07/30/2014   INR 1.1 07/27/2014   INR 1.6 07/21/2014       Assessment & Plan:

## 2014-07-30 NOTE — Patient Instructions (Signed)
Ms. Hauss,  Thank you for coming in today. Your eye exam is normal.   Switching for xarelto to coumadin, still need to bridge until your INR is 2-3 for at least 48 hrs.   Lovenox 120 mg twice daily Coumadin 10 mg daily  F/u in coumadin clinic for INR check on Monday 08/03/13  Dr. Adrian Blackwater

## 2014-07-30 NOTE — Telephone Encounter (Signed)
Pt calling to speak to nurse in regards to headaches she has been having since starting her medications. Please f/u with pt.

## 2014-07-31 NOTE — Assessment & Plan Note (Signed)
Switching for xarelto to coumadin, still need to bridge until your INR is 2-3 for at least 48 hrs.   Lovenox 120 mg twice daily Coumadin 10 mg daily  F/u in coumadin clinic for INR check on Monday 08/03/13 Goal INR 2-3 for at least 48 hrs before lovenox discontinued. If INR is 2-3 at f/u visit patient to continue current regimen and f/u in 2 days if INR still 2-3 she may d/c lovenox.

## 2014-08-03 ENCOUNTER — Ambulatory Visit: Payer: Self-pay | Attending: Family Medicine | Admitting: Pharmacist

## 2014-08-03 DIAGNOSIS — I2699 Other pulmonary embolism without acute cor pulmonale: Secondary | ICD-10-CM

## 2014-08-03 LAB — POCT INR: INR: 1.3

## 2014-08-06 ENCOUNTER — Ambulatory Visit: Payer: Self-pay | Attending: Family Medicine | Admitting: Pharmacist

## 2014-08-06 DIAGNOSIS — I2699 Other pulmonary embolism without acute cor pulmonale: Secondary | ICD-10-CM

## 2014-08-06 LAB — POCT INR: INR: 1.4

## 2014-08-06 MED ORDER — ENOXAPARIN SODIUM 100 MG/ML ~~LOC~~ SOLN: 1.0000 mg/kg | Freq: Two times a day (BID) | SUBCUTANEOUS | Status: DC

## 2014-08-11 ENCOUNTER — Ambulatory Visit: Payer: Self-pay | Attending: Family Medicine | Admitting: Pharmacist

## 2014-08-11 DIAGNOSIS — J810 Acute pulmonary edema: Secondary | ICD-10-CM

## 2014-08-11 LAB — POCT INR: INR: 1.7

## 2014-08-11 NOTE — Patient Instructions (Addendum)
Take 15 mg of coumadin tonight and tomorrow night. Return on Thursday and Friday for INR checks Continue lovenox 120 mg twice daily   Dr. Adrian Blackwater    Vitamin K Foods and Warfarin Warfarin is a medicine that helps prevent harmful blood clots by causing blood to clot more slowly. It does this by decreasing the activity of vitamin K, which promotes normal blood clotting. For the dose of warfarin you have been prescribed to work well, you need to get about the same amount of vitamin K from your food from day to day. Suddenly getting a lot more vitamin K could cause your blood to clot too quickly. A sudden decrease in vitamin K intake could cause your blood to clot too slowly. These changes in vitamin K intake could lead to dangerous blood clotsor to bleeding. WHAT GENERAL GUIDELINES DO I NEED TO FOLLOW?  Keep your intake of vitamin K consistent from day to day. To do this, you must be aware of which foods contain moderate or high amounts of vitamin K. Listed below are some foods that are very high, high, or moderately high in vitamin K. If you eat these foods, make sure you eat a consistent amount of them from day to day.  Avoid major changes in your diet, or tell your health care provider before changing your diet.  If you take a multivitamin that contains vitamin K, be sure to take it every day.  If you drink green tea, drink the same amount each day. WHAT FOODS ARE VERY HIGH IN VITAMIN K?   Greens, such as Swiss chard and beet, collard, mustard, or turnip greens (fresh or frozen, cooked).  Kale (fresh or frozen, cooked).   Parsley (raw).  Spinach (cooked).  WHAT FOODS ARE HIGH IN VITAMIN K?  Asparagus (frozen, cooked).  Beans, green (frozen, cooked).  Broccoli.   Bok choy (cooked).   Brussels sprouts (fresh or frozen, cooked).  Cabbage (cooked).  Coleslaw. WHAT FOODS ARE MODERATELY HIGH IN VITAMIN K?  Blueberries.  Black-eyed peas.  Endive (raw).   Green leaf  lettuce (raw).   Green scallions (raw).  Kale (raw).  Okra (frozen, cooked).  Plantains (fried).  Romaine lettuce (raw).   Sauerkraut (canned).   Spinach (raw). Document Released: 04/30/2009 Document Revised: 07/08/2013 Document Reviewed: 05/07/2013 [facility] Patient Information 2015 [place], [place]. This information is not intended to replace advice given to you by your health care provider. Make sure you discuss any questions you have with your health care provider.

## 2014-08-12 ENCOUNTER — Encounter: Payer: Self-pay | Admitting: Hematology

## 2014-08-12 ENCOUNTER — Ambulatory Visit (HOSPITAL_BASED_OUTPATIENT_CLINIC_OR_DEPARTMENT_OTHER): Payer: Self-pay | Admitting: Hematology

## 2014-08-12 ENCOUNTER — Telehealth: Payer: Self-pay | Admitting: Hematology

## 2014-08-12 VITALS — BP 112/68 | HR 88 | Temp 98.4°F | Resp 18 | Ht 70.0 in | Wt 267.4 lb

## 2014-08-12 DIAGNOSIS — I82409 Acute embolism and thrombosis of unspecified deep veins of unspecified lower extremity: Secondary | ICD-10-CM

## 2014-08-12 DIAGNOSIS — I2699 Other pulmonary embolism without acute cor pulmonale: Secondary | ICD-10-CM

## 2014-08-12 DIAGNOSIS — D509 Iron deficiency anemia, unspecified: Secondary | ICD-10-CM

## 2014-08-12 DIAGNOSIS — I8290 Acute embolism and thrombosis of unspecified vein: Secondary | ICD-10-CM | POA: Insufficient documentation

## 2014-08-12 DIAGNOSIS — D500 Iron deficiency anemia secondary to blood loss (chronic): Secondary | ICD-10-CM

## 2014-08-12 NOTE — Progress Notes (Signed)
[place] [phone]   Fax:([phone]) [phone]  NEW PATIENT EVALUATION   Name: Donna [name] Date: 08/12/2014 MRN: [id number] DOB: 11-03-1970  PCP: Minerva Ends, MD   REFERRING PHYSICIAN: Tresa Garter, MD  REASON FOR REFERRAL: recurrent DVT/PE     HISTORY OF PRESENT ILLNESS:Donna [name] is a 44 y.o. 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  Her DVT was thought to have been provoked by a leg spriain that happened a few days before her developing symptoms.  She had a hypercoagulable workup which was positive for lupus anticoagulant and decreased protein C levels.  She was discharge from the hospital on 5/28, and today she is inquiring whether she can come off anticoagulation.  She denies any prior DVTs or blood clots or family history of blood clots in the past.   She denies any melena or hematochezia or bleeding complications while on coumadin.  She has one child without a history of having miscarriages.  Due to recent subtherapeutic INR levels, she was started on 10 mg of coumadin on 06/03/2013.  Her INR on 05/28/2013 was 1.4 and on 06/02/2013 was 1.5.   She had repeat hypercoagulable workup on 06/05/2013 which revealed lupus anticoagulant was not detected.  She was negative for factor V mutation and prothrombin II gene mutation.  Her anticardiolipin were alo negative.  Antithrombin III levels were in normal range.      INTERIM HISTORY: She has been off coumadin since 06/2013. She was admitted in Dec 2015 for a new PE in right lung. She is back on coumadin now. She did not have injury or surgery or other provoking events before the recent episode of PE. Leg [place] was negative for DVT. She feels well overall, no complains. She has heavy menstrual period, it lasts 4-5 days, she needs to change pad every hour for the first 2 days, especially when she is on coumadin, no other bleedings.    PAST MEDICAL HISTORY:  has a past  medical history of Lung collapse; Pulmonary embolism, blood-clot, obstetric; and DVT (deep venous thrombosis) (12/06/2012).     PAST SURGICAL HISTORY: Past Surgical History  Procedure Laterality Date  . Rib resection     CURRENT MEDICATIONS: has a current medication list which includes the following prescription(s): acetaminophen-codeine, enoxaparin, oxycodone-acetaminophen, and warfarin.   ALLERGIES: Review of patient's allergies indicates no known allergies.   SOCIAL HISTORY:  reports that she has never smoked. She has never used smokeless tobacco. She reports that she drinks alcohol. She reports that she does not use illicit drugs.   FAMILY HISTORY: family history includes COPD in her father; Stroke in her father.   LABORATORY DATA:  Results for orders placed or performed in visit on 08/11/14 (from the past 48 hour(s))  INR     Status: None   Collection Time: 08/11/14  2:23 PM  Result Value Ref Range   INR 1.7      CBC CBC Latest Ref Rng 07/18/2014 07/17/2014 07/16/2014  WBC 4.0 - 10.5 K/uL 5.9 8.8 9.2  Hemoglobin 12.0 - 15.0 g/dL 8.1(L) 8.5(L) 8.9(L)  Hematocrit 36.0 - 46.0 % 28.2(L) 29.2(L) 30.2(L)  Platelets 150 - 400 K/uL 335 344 338    CMP Latest Ref Rng 07/16/2014 03/08/2014 07/23/2013  Glucose 70 - 99 mg/dL 103(H) 94 105(H)  BUN 6 - 23 mg/dL 10 14 14   Creatinine 0.50 - 1.10 mg/dL 0.87 1.02 1.00  Sodium 135 - 145  mmol/L 137 138 139  Potassium 3.5 - 5.1 mmol/L 4.0 3.8 4.1  Chloride 96 - 112 mEq/L 106 104 105  CO2 19 - 32 mmol/L 25 23 -  Calcium 8.4 - 10.5 mg/dL 8.4 8.9 -  Total Protein 6.0 - 8.3 g/dL 7.0 - -  Total Bilirubin 0.3 - 1.2 mg/dL 0.4 - -  Alkaline Phos 39 - 117 U/L 73 - -  AST 0 - 37 U/L 17 - -  ALT 0 - 35 U/L 17 - -      RADIOGRAPHY: 12/05/2012 CT ANGIOGRAPHY CHEST  Technique: Multidetector CT imaging of the chest using the  standard protocol during bolus administration of intravenous  contrast. Multiplanar reconstructed images including MIPs were   obtained and reviewed to evaluate the vascular anatomy.  Contrast: 164mL OMNIPAQUE IOHEXOL 350 MG/ML SOLN  Comparison: CT chest 07/20/2012.  Findings: The chest wall is unremarkable and stable. No breast  masses, supraclavicular or axillary adenopathy. Small scattered  benign appearing fatty lymph nodes are noted.  The bony thorax is intact.  The heart is normal in size. No pericardial effusion. No  mediastinal or hilar lymphadenopathy. Small scattered lymph nodes  are noted. The esophagus is grossly normal. A small hiatal hernia  is noted. The aorta is normal in caliber. No dissection.  The pulmonary arterial tree is fairly well opacified. There are  bilateral pulmonary emboli.  Examination of the lung parenchyma demonstrates patchy subsegmental  bibasilar atelectasis. No worrisome mass lesions or  bronchiectasis.  The upper abdomen is unremarkable.  IMPRESSION:  1. Small bilateral pulmonary emboli.  2. Normal thoracic aorta.  3. Patchy bibasilar subsegmental atelectasis.   Venous Duplex Noninvasive Vascular Lab  Bilateral Lower Extremity Venous Duplex Evaluation  Patient: Donna [name], Donna [name] MR #: [id number] Study Date: 12/05/2012 Gender: F Age: 7 Height: Weight: BSA: Pt. Status: Room: [place], Cruzita Lederer SONOGRAPHER [facility], RVT Reports also to:  ------------------------------------------------------------ History and indications:  Indications  415.19 Other pulmonary embolism and infarction. History  Diagnostic evaluation. Pulmonary embolus.  ------------------------------------------------------------ Study information:  Study status: Routine. Procedure: A vascular evaluation was performed with the patient in the supine position. The right common femoral, right femoral, right profunda femoral, right popliteal, right peroneal, right posterior tibial, left common femoral, left femoral, left profunda  femoral, left popliteal, left peroneal, and left posterior tibial veins were studied. Bilateral lower extremity venous duplex evaluation. Doppler flow study including B-mode compression maneuvers of all visualized segments, color flow Doppler and selected views of pulsed wave Doppler. Location: Bedside. Patient status: Inpatient.  Summary:  - Findings consistent with deep vein thrombosis involving the right posterial tibial vein and right peroneal vein. - No evidence of deep vein thrombosis involving the left lower extremity. Other specific details can be found in the table(s) above. Prepared and Electronically Authenticated by  REVIEW OF SYSTEMS:  Constitutional: Denies fevers, chills or abnormal weight loss Eyes: Denies blurriness of vision Ears, nose, mouth, throat, and face: Denies mucositis or sore throat Respiratory: Denies cough, dyspnea or wheezes Cardiovascular: Denies palpitation, chest discomfort or lower extremity swelling Gastrointestinal:  Denies nausea, heartburn or change in bowel habits Skin: Denies abnormal skin rashes Lymphatics: Denies new lymphadenopathy or easy bruising Neurological:Denies numbness, tingling or new weaknesses Behavioral/Psych: Mood is stable, no new changes  All other systems were reviewed with the patient and are negative.  PHYSICAL EXAM:  height is 5\' 10"  (1.778 m) and weight is 267 lb 6.4 oz (121.292 kg). Her oral  temperature is 98.4 F (36.9 C). Her blood pressure is 112/68 and her pulse is 88. Her respiration is 18 and oxygen saturation is 100%.   GENERAL:alert, no distress and comfortable; Moderately obese.  SKIN: skin color, texture, turgor are normal, no rashes or significant lesions EYES: normal, Conjunctiva are pink and non-injected, sclera clear OROPHARYNX:no exudate, no erythema and lips, buccal mucosa, and tongue normal  NECK: supple, thyroid normal size, non-tender, without nodularity LYMPH:  no palpable lymphadenopathy in the  cervical, axillary or inguinal LUNGS: clear to auscultation and percussion with normal breathing effort HEART: regular rate & rhythm and no murmurs and no lower extremity edema ABDOMEN:abdomen soft, non-tender and normal bowel sounds Musculoskeletal:no cyanosis of digits and no clubbing  NEURO: alert & oriented x 3 with fluent speech, no focal motor/sensory deficits   IMPRESSION: Donna [name] is a 44 y.o. female with a history of   RADIOLOGY CT chest 07/16/2014 IMPRESSION: Right lower lobe pulmonary emboli with associated small right pleural effusion.  Bibasilar atelectasis.  PLAN:  1.  Recurrent DVT/PE.  --She had provoked DVT and PE in 2014, and second episode off on provoked TE in December 2015 after she was off Coumadin. --Her PTT Lupus anticoagulant was 89.1 on 12/05/2012 and decreased Protein C levels.  Her reepat lupus anticoagulant is negative without detection of a lupus anticoagulant. Hypercoagulopathy workup was negative when she was off Coumadin. -I recommend anticoagulation for life long if there is no contraindication such as bleeding. -We discussed Different options of anticoagulation. She could not tolerate Xarelto. She has some skin thickening in the neck, questionable skin necrosis from committing, we'll watch closely.  -She is still on Lovenox for bridging due to hypotherapeutic INR. She is noted to hesitate to commit continuing Lovenox for long-term.  2. Anemia of iron deficiency -She has fairly low ferritin and iron level, all consistent with iron deficiency. -She is agreeable to restart by mouth iron 3 times a day, constipation strategy reviewed with her -Repeat iron  study in 5-6 weeks, if she still has low iron, or issues with tolerance of by mouth iron, I will recommend  IV Feraheme.   Obesity. -- Continued dieting and exercises per her PCP.   3. Follow-up. -Return in 6 weeks with CBC and iron study    Plan: -continue coumadin, follow up at out  coumadin clinic -po iron 3 tab daily RTC in 6 weeks     Truitt Merle, MD @T (<PARAMETER> error)@ 8:34 AM

## 2014-08-12 NOTE — Telephone Encounter (Signed)
Gave avs & calendar for March. °

## 2014-08-13 ENCOUNTER — Ambulatory Visit: Payer: Medicaid Other | Attending: Family Medicine | Admitting: Pharmacist

## 2014-08-13 DIAGNOSIS — I2699 Other pulmonary embolism without acute cor pulmonale: Secondary | ICD-10-CM

## 2014-08-13 LAB — POCT INR: INR: 2.7

## 2014-08-13 MED ORDER — ENOXAPARIN SODIUM 100 MG/ML ~~LOC~~ SOLN: 1.0000 mg/kg | Freq: Two times a day (BID) | SUBCUTANEOUS | Status: DC

## 2014-08-14 ENCOUNTER — Ambulatory Visit: Payer: Self-pay | Attending: Family Medicine | Admitting: Pharmacist

## 2014-08-14 DIAGNOSIS — I2699 Other pulmonary embolism without acute cor pulmonale: Secondary | ICD-10-CM

## 2014-08-14 LAB — POCT INR: INR: 2.5

## 2014-08-20 ENCOUNTER — Ambulatory Visit: Payer: Self-pay | Attending: Family Medicine | Admitting: Pharmacist

## 2014-08-20 DIAGNOSIS — I2699 Other pulmonary embolism without acute cor pulmonale: Secondary | ICD-10-CM

## 2014-08-20 DIAGNOSIS — I8290 Acute embolism and thrombosis of unspecified vein: Secondary | ICD-10-CM

## 2014-08-20 LAB — POCT INR: INR: 2.3

## 2014-08-20 MED ORDER — WARFARIN SODIUM 10 MG PO TABS: 10.0000 mg | ORAL_TABLET | Freq: Once | ORAL | Status: DC

## 2014-08-20 NOTE — Patient Instructions (Signed)
Continue coumadin with current regiment Return to office in two weeks for INR check

## 2014-09-03 ENCOUNTER — Ambulatory Visit: Payer: Self-pay | Attending: Family Medicine | Admitting: Pharmacist

## 2014-09-03 DIAGNOSIS — I8290 Acute embolism and thrombosis of unspecified vein: Secondary | ICD-10-CM

## 2014-09-03 LAB — POCT INR: INR: 2.2

## 2014-09-16 ENCOUNTER — Emergency Department (HOSPITAL_COMMUNITY): Payer: Self-pay

## 2014-09-16 ENCOUNTER — Emergency Department (HOSPITAL_COMMUNITY)
Admission: EM | Admit: 2014-09-16 | Discharge: 2014-09-16 | Disposition: A | Discharge status: Home / Self Care | Payer: Medicaid Other | Attending: Emergency Medicine | Admitting: Emergency Medicine

## 2014-09-16 ENCOUNTER — Emergency Department (HOSPITAL_COMMUNITY): Payer: Medicaid Other

## 2014-09-16 ENCOUNTER — Other Ambulatory Visit (HOSPITAL_BASED_OUTPATIENT_CLINIC_OR_DEPARTMENT_OTHER): Payer: Self-pay

## 2014-09-16 ENCOUNTER — Encounter (HOSPITAL_COMMUNITY): Payer: Self-pay

## 2014-09-16 DIAGNOSIS — M546 Pain in thoracic spine: Secondary | ICD-10-CM

## 2014-09-16 DIAGNOSIS — D500 Iron deficiency anemia secondary to blood loss (chronic): Secondary | ICD-10-CM

## 2014-09-16 DIAGNOSIS — Z86711 Personal history of pulmonary embolism: Secondary | ICD-10-CM | POA: Insufficient documentation

## 2014-09-16 DIAGNOSIS — R0789 Other chest pain: Secondary | ICD-10-CM | POA: Insufficient documentation

## 2014-09-16 DIAGNOSIS — R0602 Shortness of breath: Secondary | ICD-10-CM | POA: Insufficient documentation

## 2014-09-16 DIAGNOSIS — Z7901 Long term (current) use of anticoagulants: Secondary | ICD-10-CM | POA: Insufficient documentation

## 2014-09-16 DIAGNOSIS — I728 Aneurysm of other specified arteries: Secondary | ICD-10-CM | POA: Insufficient documentation

## 2014-09-16 DIAGNOSIS — D509 Iron deficiency anemia, unspecified: Secondary | ICD-10-CM

## 2014-09-16 DIAGNOSIS — Z86718 Personal history of other venous thrombosis and embolism: Secondary | ICD-10-CM | POA: Insufficient documentation

## 2014-09-16 DIAGNOSIS — I2699 Other pulmonary embolism without acute cor pulmonale: Secondary | ICD-10-CM

## 2014-09-16 LAB — BASIC METABOLIC PANEL
ANION GAP: 5 (ref 5–15)
BUN: 15 mg/dL (ref 6–23)
CO2: 24 mmol/L (ref 19–32)
CREATININE: 1.1 mg/dL (ref 0.50–1.10)
Calcium: 8.7 mg/dL (ref 8.4–10.5)
Chloride: 105 mmol/L (ref 96–112)
GFR calc Af Amer: 70 mL/min — ABNORMAL LOW (ref >90)
GFR, EST NON AFRICAN AMERICAN: 61 mL/min — AB (ref >90)
Glucose, Bld: 97 mg/dL (ref 70–99)
Potassium: 3.8 mmol/L (ref 3.5–5.1)
Sodium: 134 mmol/L — ABNORMAL LOW (ref 135–145)

## 2014-09-16 LAB — I-STAT TROPONIN, ED: Troponin i, poc: 0 ng/mL (ref 0.00–0.08)

## 2014-09-16 LAB — COMPREHENSIVE METABOLIC PANEL (CC13)
ALK PHOS: 71 U/L (ref 40–150)
ALT: 12 U/L (ref 0–55)
AST: 15 U/L (ref 5–34)
Albumin: 2.8 g/dL — ABNORMAL LOW (ref 3.5–5.0)
Anion Gap: 10 mEq/L (ref 3–11)
BUN: 12.8 mg/dL (ref 7.0–26.0)
CO2: 22 mEq/L (ref 22–29)
Calcium: 8.5 mg/dL (ref 8.4–10.4)
Chloride: 107 mEq/L (ref 98–109)
Creatinine: 1 mg/dL (ref 0.6–1.1)
EGFR: 84 mL/min/{1.73_m2} — AB (ref >90)
GLUCOSE: 92 mg/dL (ref 70–140)
Potassium: 4.2 mEq/L (ref 3.5–5.1)
SODIUM: 139 meq/L (ref 136–145)
TOTAL PROTEIN: 6.4 g/dL (ref 6.4–8.3)
Total Bilirubin: 0.21 mg/dL (ref 0.20–1.20)

## 2014-09-16 LAB — CBC & DIFF AND RETIC
BASO%: 0.6 % (ref 0.0–2.0)
BASOS ABS: 0 10*3/uL (ref 0.0–0.1)
EOS%: 2.4 % (ref 0.0–7.0)
Eosinophils Absolute: 0.1 10*3/uL (ref 0.0–0.5)
HCT: 38.6 % (ref 34.8–46.6)
HGB: 12 g/dL (ref 11.6–15.9)
IMMATURE RETIC FRACT: 21.9 % — AB (ref 1.60–10.00)
LYMPH#: 2.3 10*3/uL (ref 0.9–3.3)
LYMPH%: 45.9 % (ref 14.0–49.7)
MCH: 23.6 pg — AB (ref 25.1–34.0)
MCHC: 31.1 g/dL — ABNORMAL LOW (ref 31.5–36.0)
MCV: 75.8 fL — ABNORMAL LOW (ref 79.5–101.0)
MONO#: 0.4 10*3/uL (ref 0.1–0.9)
MONO%: 8.5 % (ref 0.0–14.0)
NEUT#: 2.1 10*3/uL (ref 1.5–6.5)
NEUT%: 42.6 % (ref 38.4–76.8)
PLATELETS: 317 10*3/uL (ref 145–400)
RBC: 5.09 10*6/uL (ref 3.70–5.45)
RETIC CT ABS: 108.93 10*3/uL — AB (ref 33.70–90.70)
Retic %: 2.14 % — ABNORMAL HIGH (ref 0.70–2.10)
WBC: 5 10*3/uL (ref 3.9–10.3)

## 2014-09-16 LAB — PROTIME-INR
INR: 3.25 — ABNORMAL HIGH (ref 0.00–1.49)
Prothrombin Time: 33.4 seconds — ABNORMAL HIGH (ref 11.6–15.2)

## 2014-09-16 LAB — CBC
HEMATOCRIT: 37.8 % (ref 36.0–46.0)
HEMOGLOBIN: 11.7 g/dL — AB (ref 12.0–15.0)
MCH: 23.4 pg — AB (ref 26.0–34.0)
MCHC: 31 g/dL (ref 30.0–36.0)
MCV: 75.8 fL — AB (ref 78.0–100.0)
PLATELETS: 351 10*3/uL (ref 150–400)
RBC: 4.99 MIL/uL (ref 3.87–5.11)
WBC: 8.2 10*3/uL (ref 4.0–10.5)

## 2014-09-16 LAB — HCG, QUANTITATIVE, PREGNANCY: hCG, Beta Chain, Quant, S: <1 m[IU]/mL (ref <5)

## 2014-09-16 MED ORDER — KETOROLAC TROMETHAMINE 30 MG/ML IJ SOLN
30.0000 mg | Freq: Once | INTRAMUSCULAR | Status: AC
  Administered 2014-09-16: 30 mg via INTRAVENOUS
  Filled 2014-09-16: qty 1

## 2014-09-16 MED ORDER — METHOCARBAMOL 500 MG PO TABS: 1000.0000 mg | ORAL_TABLET | Freq: Three times a day (TID) | ORAL | Status: DC | PRN

## 2014-09-16 MED ORDER — IBUPROFEN 600 MG PO TABS: 600.0000 mg | ORAL_TABLET | Freq: Four times a day (QID) | ORAL | Status: DC | PRN

## 2014-09-16 MED ORDER — ASPIRIN 81 MG PO CHEW
324.0000 mg | CHEWABLE_TABLET | Freq: Once | ORAL | Status: DC
  Filled 2014-09-16: qty 4

## 2014-09-16 MED ORDER — IOHEXOL 350 MG/ML SOLN
100.0000 mL | Freq: Once | INTRAVENOUS | Status: AC | PRN
  Administered 2014-09-16: 100 mL via INTRAVENOUS

## 2014-09-16 MED ORDER — ASPIRIN 325 MG PO TABS: 325.0000 mg | ORAL_TABLET | ORAL | Status: DC

## 2014-09-16 MED ORDER — METHOCARBAMOL 500 MG PO TABS
1000.0000 mg | ORAL_TABLET | Freq: Once | ORAL | Status: AC
  Administered 2014-09-16: 1000 mg via ORAL
  Filled 2014-09-16: qty 2

## 2014-09-16 NOTE — Discharge Instructions (Signed)
Chest Pain (Nonspecific) °It is often hard to give a specific diagnosis for the cause of chest pain. There is always a chance that your pain could be related to something serious, such as a heart attack or a blood clot in the lungs. You need to follow up with your health care provider for further evaluation. °CAUSES  °· Heartburn. °· Pneumonia or bronchitis. °· Anxiety or stress. °· Inflammation around your heart (pericarditis) or lung (pleuritis or pleurisy). °· A blood clot in the lung. °· A collapsed lung (pneumothorax). It can develop suddenly on its own (spontaneous pneumothorax) or from trauma to the chest. °· Shingles infection (herpes zoster virus). °The chest wall is composed of bones, muscles, and cartilage. Any of these can be the source of the pain. °· The bones can be bruised by injury. °· The muscles or cartilage can be strained by coughing or overwork. °· The cartilage can be affected by inflammation and become sore (costochondritis). °DIAGNOSIS  °Lab tests or other studies may be needed to find the cause of your pain. Your health care provider may have you take a test called an ambulatory electrocardiogram (ECG). An ECG records your heartbeat patterns over a 24-hour period. You may also have other tests, such as: °· Transthoracic echocardiogram (TTE). During echocardiography, sound waves are used to evaluate how blood flows through your heart. °· Transesophageal echocardiogram (TEE). °· Cardiac monitoring. This allows your health care provider to monitor your heart rate and rhythm in real time. °· Holter monitor. This is a portable device that records your heartbeat and can help diagnose heart arrhythmias. It allows your health care provider to track your heart activity for several days, if needed. °· Stress tests by exercise or by giving medicine that makes the heart beat faster. °TREATMENT  °· Treatment depends on what may be causing your chest pain. Treatment may include: °¨ Acid blockers for  heartburn. °¨ Anti-inflammatory medicine. °¨ Pain medicine for inflammatory conditions. °¨ Antibiotics if an infection is present. °· You may be advised to change lifestyle habits. This includes stopping smoking and avoiding alcohol, caffeine, and chocolate. °· You may be advised to keep your head raised (elevated) when sleeping. This reduces the chance of acid going backward from your stomach into your esophagus. °Most of the time, nonspecific chest pain will improve within 2-3 days with rest and mild pain medicine.  °HOME CARE INSTRUCTIONS  °· If antibiotics were prescribed, take them as directed. Finish them even if you start to feel better. °· For the next few days, avoid physical activities that bring on chest pain. Continue physical activities as directed. °· Do not use any tobacco products, including cigarettes, chewing tobacco, or electronic cigarettes. °· Avoid drinking alcohol. °· Only take medicine as directed by your health care provider. °· Follow your health care provider's suggestions for further testing if your chest pain does not go away. °· Keep any follow-up appointments you made. If you do not go to an appointment, you could develop lasting (chronic) problems with pain. If there is any problem keeping an appointment, call to reschedule. °SEEK MEDICAL CARE IF:  °· Your chest pain does not go away, even after treatment. °· You have a rash with blisters on your chest. °· You have a fever. °SEEK IMMEDIATE MEDICAL CARE IF:  °· You have increased chest pain or pain that spreads to your arm, neck, jaw, back, or abdomen. °· You have shortness of breath. °· You have an increasing cough, or you cough   up blood.  You have severe back or abdominal pain.  You feel nauseous or vomit.  You have severe weakness.  You faint.  You have chills. This is an emergency. Do not wait to see if the pain will go away. Get medical help at once. Call your local emergency services (911 in [place] Do not drive  yourself to the hospital. MAKE SURE YOU:   Understand these instructions.  Will watch your condition.  Will get help right away if you are not doing well or get worse. Document Released: 04/12/2005 Document Revised: 07/08/2013 Document Reviewed: 02/06/2008 [facility] Patient Information 2015 [place], [place]. This information is not intended to replace advice given to you by your health care provider. Make sure you discuss any questions you have with your health care provider. Musculoskeletal Pain Musculoskeletal pain is muscle and boney aches and pains. These pains can occur in any part of the body. Your caregiver may treat you without knowing the cause of the pain. They may treat you if blood or urine tests, X-rays, and other tests were normal.  CAUSES There is often not a definite cause or reason for these pains. These pains may be caused by a type of germ (virus). The discomfort may also come from overuse. Overuse includes working out too hard when your body is not fit. Boney aches also come from weather changes. Bone is sensitive to atmospheric pressure changes. HOME CARE INSTRUCTIONS   Ask when your test results will be ready. Make sure you get your test results.  Only take over-the-counter or prescription medicines for pain, discomfort, or fever as directed by your caregiver. If you were given medications for your condition, do not drive, operate machinery or power tools, or sign legal documents for 24 hours. Do not drink alcohol. Do not take sleeping pills or other medications that may interfere with treatment.  Continue all activities unless the activities cause more pain. When the pain lessens, slowly resume normal activities. Gradually increase the intensity and duration of the activities or exercise.  During periods of severe pain, bed rest may be helpful. Lay or sit in any position that is comfortable.  Putting ice on the injured area.  Put ice in a bag.  Place a towel between  your skin and the bag.  Leave the ice on for 15 to 20 minutes, 3 to 4 times a day.  Follow up with your caregiver for continued problems and no reason can be found for the pain. If the pain becomes worse or does not go away, it may be necessary to repeat tests or do additional testing. Your caregiver may need to look further for a possible cause. SEEK IMMEDIATE MEDICAL CARE IF:  You have pain that is getting worse and is not relieved by medications.  You develop chest pain that is associated with shortness or breath, sweating, feeling sick to your stomach (nauseous), or throw up (vomit).  Your pain becomes localized to the abdomen.  You develop any new symptoms that seem different or that concern you. MAKE SURE YOU:   Understand these instructions.  Will watch your condition.  Will get help right away if you are not doing well or get worse. Document Released: 07/03/2005 Document Revised: 09/25/2011 Document Reviewed: 03/07/2013 [facility] Patient Information 2015 [place], [place]. This information is not intended to replace advice given to you by your health care provider. Make sure you discuss any questions you have with your health care provider.

## 2014-09-16 NOTE — ED Notes (Signed)
Patient ambulatory to restroom  ?

## 2014-09-16 NOTE — ED Notes (Signed)
Patient states chest pain "comes and goes" patient also states numbness to left finger tips and arm

## 2014-09-16 NOTE — ED Provider Notes (Signed)
CSN: [id number]     Arrival date & time 09/16/14  5397 History   First MD Initiated Contact with Patient 09/16/14 0359     Chief Complaint  Patient presents with  . Chest Pain     (Consider location/radiation/quality/duration/timing/severity/associated sxs/prior Treatment) HPI Patient presents with acute onset chest pain and tightness starting around 1 AM. She states the pain radiates to her back mostly on the left. It is associated with some mild shortness of breath. The pain is episodic and now has resolved. She denies any fever or chills. No cough. 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 She's had no new lower extremity swelling or pain. No history of any coronary artery disease. Past Medical History  Diagnosis Date  . Lung collapse   . Pulmonary embolism, blood-clot, obstetric   . DVT (deep venous thrombosis) 12/06/2012   Past Surgical History  Procedure Laterality Date  . Rib resection     Family History  Problem Relation Age of Onset  . COPD Father   . Stroke Father    History  Substance Use Topics  . Smoking status: Never Smoker   . Smokeless tobacco: Never Used  . Alcohol Use: Yes     Comment: socially    OB History    Gravida Para Term Preterm AB TAB SAB Ectopic Multiple Living   1 1 1  0 0 0 0 0 0 1     Review of Systems  Constitutional: Negative for fever and chills.  Respiratory: Positive for chest tightness and shortness of breath. Negative for cough and wheezing.   Cardiovascular: Positive for chest pain. Negative for palpitations and leg swelling.  Gastrointestinal: Negative for nausea, vomiting, abdominal pain and diarrhea.  Musculoskeletal: Positive for myalgias and back pain. Negative for neck pain and neck stiffness.  Skin: Negative for rash and wound.  Neurological: Negative for dizziness, light-headedness and headaches.  All other systems reviewed and are negative.     Allergies  Review of patient's allergies  indicates no known allergies.  Home Medications   Prior to Admission medications   Medication Sig Start Date End Date Taking? Authorizing Provider  acetaminophen-codeine (TYLENOL #3) 300-30 MG per tablet Take 1 tablet by mouth every 6 (six) hours as needed for moderate pain. 07/27/14  Yes Josalyn C Funches, MD  warfarin (COUMADIN) 10 MG tablet Take 1 tablet (10 mg total) by mouth one time only at 6 PM. Patient taking differently: Take 10-15 mg by mouth See admin instructions. 10mg  daily except Tuesday and Wednesdays take 15mg  08/20/14  Yes Josalyn C Funches, MD  enoxaparin (LOVENOX) 100 MG/ML injection Inject 1.2 mLs (120 mg total) into the skin every 12 (twelve) hours. Patient not taking: Reported on 09/16/2014 08/13/14   Minerva Ends, MD  ibuprofen (ADVIL,MOTRIN) 600 MG tablet Take 1 tablet (600 mg total) by mouth every 6 (six) hours as needed. 09/16/14   Julianne Rice, MD  methocarbamol (ROBAXIN) 500 MG tablet Take 2 tablets (1,000 mg total) by mouth every 8 (eight) hours as needed for muscle spasms. 09/16/14   Julianne Rice, MD  oxyCODONE-acetaminophen (PERCOCET/ROXICET) 5-325 MG per tablet Take 1 tablet by mouth every 4 (four) hours as needed for severe pain. Patient not taking: Reported on 09/16/2014 07/18/14   Eugenie Filler, MD   BP 120/77 mmHg  Pulse 79  Temp(Src) 98.2 F (36.8 C) (Oral)  Resp 18  Ht 5\' 5"  (1.651 m)  Wt 250 lb (113.399 kg)  BMI 41.60 kg/m2  SpO2 100%  LMP 09/09/2014 Physical Exam  Constitutional: She is oriented to person, place, and time. She appears well-developed and well-nourished. No distress.  HENT:  Head: Normocephalic and atraumatic.  Mouth/Throat: Oropharynx is clear and moist.  Eyes: EOM are normal. Pupils are equal, round, and reactive to light.  Neck: Normal range of motion. Neck supple.  Cardiovascular: Normal rate and regular rhythm.   Pulmonary/Chest: Effort normal and breath sounds normal. No respiratory distress. She has no wheezes. She has  no rales. She exhibits tenderness (mild left upper chest tenderness with palpation.).  Abdominal: Soft. Bowel sounds are normal. She exhibits no distension and no mass. There is no tenderness. There is no rebound and no guarding.  Musculoskeletal: Normal range of motion. She exhibits no edema or tenderness.  No midline thoracic or lumbar tenderness with palpation. No calf swelling or tenderness.  Neurological: She is alert and oriented to person, place, and time.  Skin: Skin is warm and dry. No rash noted. No erythema.  Psychiatric: She has a normal mood and affect. Her behavior is normal.  Nursing note and vitals reviewed.   ED Course  Procedures (including critical care time) Labs Review Labs Reviewed  BASIC METABOLIC PANEL - Abnormal; Notable for the following:    Sodium 134 (*)    GFR calc non Af Amer 61 (*)    GFR calc Af Amer 70 (*)    All other components within normal limits  CBC - Abnormal; Notable for the following:    Hemoglobin 11.7 (*)    MCV 75.8 (*)    MCH 23.4 (*)    All other components within normal limits  PROTIME-INR - Abnormal; Notable for the following:    Prothrombin Time 33.4 (*)    INR 3.25 (*)    All other components within normal limits  HCG, QUANTITATIVE, PREGNANCY  I-STAT TROPOININ, ED  I-STAT TROPOININ, ED    Imaging Review Ct Angio Chest Pe W/cm &/or Wo Cm  09/16/2014   CLINICAL DATA:  Chest pain to the back. History pulmonary embolism and left lung collapse.  EXAM: CT ANGIOGRAPHY CHEST WITH CONTRAST  TECHNIQUE: Multidetector CT imaging of the chest was performed using the standard protocol during bolus administration of intravenous contrast. Multiplanar CT image reconstructions and MIPs were obtained to evaluate the vascular anatomy.  CONTRAST:  175mL OMNIPAQUE IOHEXOL 350 MG/ML SOLN  COMPARISON:  07/16/2014  FINDINGS: THORACIC INLET/BODY WALL:  No acute abnormality.  10 mm thyroid nodule in the right lobe which is likely stable from 2014 and  incidental. No invasive/malignant features.  MEDIASTINUM:  Normal heart size. No pericardial effusion. No acute vascular abnormality, including aortic dissection or pulmonary embolism. No adenopathy.  LUNG WINDOWS:  No consolidation.  No effusion.  No suspicious pulmonary nodule.  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  OSSEOUS:  Changes of left first rib resection.  No acute findings.  Review of the MIP images confirms the above findings.  IMPRESSION: 1. Negative for pulmonary embolism. 2. 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 Recommend 1 year surveillance imaging, which could be arranged through the [facility].   Electronically Signed   By: Monte Fantasia M.D.   On: 09/16/2014 06:56   Dg Chest Port 1 View  09/16/2014   CLINICAL DATA:  Chest pain and tightness  EXAM: PORTABLE CHEST - 1 VIEW  COMPARISON:  07/16/2014  FINDINGS: Normal  heart size and mediastinal contours. No acute infiltrate or edema. No effusion or pneumothorax. Status post left first rib resection, possibly related to patient's history of previous venous thrombosis. No acute osseous findings.  IMPRESSION: No active disease.   Electronically Signed   By: Monte Fantasia M.D.   On: 09/16/2014 04:24     EKG Interpretation   Date/Time:  Wednesday September 16 2014 03:38:34 EST Ventricular Rate:  77 PR Interval:  147 QRS Duration: 83 QT Interval:  387 QTC Calculation: 438 R Axis:   79 Text Interpretation:  Sinus rhythm Borderline T abnormalities, anterior  leads Confirmed by Christen Wardrop  MD, Yuriel Lopezmartinez ([id number]) on 09/16/2014 5:05:00 AM      MDM   Final diagnoses:  Atypical chest pain  Left-sided thoracic back pain  Splenic artery aneurysm     Patient with atypical chest and thoracic back pain. EKG without any acute findings. Initial troponin is normal. INR is therapeutic. CT chest without any evidence of pulmonary embolism cause for the patient's  symptoms. Patient does have reproducible findings on exam. This likely muscular in nature. At the suspicion for coronary artery disease. Heart score is 0. We'll repeat troponin given recent nature of the patient's chest pain. If normal will D/C home with symptomatic treatment for musculoskeletal pain.  Incidental splenic aneurysm seen on CT. Will need follow-up in 1 year.  Julianne Rice, MD 09/16/14 [phone]

## 2014-09-16 NOTE — ED Notes (Signed)
Pt complains of chest pain and tightness since 1am, pt states that the pain radiates to her back, it has eased up now but still has an achy feeling.

## 2014-09-17 ENCOUNTER — Ambulatory Visit: Payer: Self-pay | Attending: Family Medicine | Admitting: Pharmacist

## 2014-09-17 DIAGNOSIS — I8290 Acute embolism and thrombosis of unspecified vein: Secondary | ICD-10-CM

## 2014-09-17 LAB — FERRITIN CHCC: Ferritin: 14 ng/ml (ref 9–269)

## 2014-09-17 LAB — POCT INR: INR: 5.4

## 2014-09-17 LAB — IRON AND TIBC CHCC
%SAT: 9 % — AB (ref 21–57)
IRON: 29 ug/dL — AB (ref 41–142)
TIBC: 313 ug/dL (ref 236–444)
UIBC: 283 ug/dL (ref 120–384)

## 2014-09-22 ENCOUNTER — Ambulatory Visit: Payer: Self-pay | Attending: Family Medicine | Admitting: Pharmacist

## 2014-09-22 ENCOUNTER — Encounter: Payer: Self-pay | Admitting: Hematology

## 2014-09-22 ENCOUNTER — Telehealth: Payer: Self-pay | Admitting: Hematology

## 2014-09-22 ENCOUNTER — Ambulatory Visit (HOSPITAL_BASED_OUTPATIENT_CLINIC_OR_DEPARTMENT_OTHER): Payer: Self-pay | Admitting: Hematology

## 2014-09-22 VITALS — BP 103/80 | HR 84 | Temp 98.5°F | Resp 21 | Ht 65.0 in | Wt 272.8 lb

## 2014-09-22 DIAGNOSIS — E669 Obesity, unspecified: Secondary | ICD-10-CM

## 2014-09-22 DIAGNOSIS — D509 Iron deficiency anemia, unspecified: Secondary | ICD-10-CM

## 2014-09-22 DIAGNOSIS — I8290 Acute embolism and thrombosis of unspecified vein: Secondary | ICD-10-CM

## 2014-09-22 DIAGNOSIS — Z7901 Long term (current) use of anticoagulants: Secondary | ICD-10-CM

## 2014-09-22 DIAGNOSIS — I2699 Other pulmonary embolism without acute cor pulmonale: Secondary | ICD-10-CM

## 2014-09-22 DIAGNOSIS — Z86718 Personal history of other venous thrombosis and embolism: Secondary | ICD-10-CM

## 2014-09-22 LAB — POCT INR: INR: 1

## 2014-09-22 MED ORDER — WARFARIN SODIUM 10 MG PO TABS: 10.0000 mg | ORAL_TABLET | ORAL | Status: DC

## 2014-09-22 NOTE — Progress Notes (Signed)
[place] Telephone:([phone]) [phone]   Fax:(336) [phone]  NEW PATIENT EVALUATION   Name: Donna [name] Date: 09/22/2014 MRN: [id number] DOB: 05-15-1971  PCP: Minerva Ends, MD   REFERRING PHYSICIAN: Boykin Nearing, MD  REASON FOR REFERRAL: recurrent DVT/PE, iron deficient anemia    HISTORY OF PRESENT ILLNESS:Donna [name] is a 44 y.o. 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  Her DVT was thought to have been provoked by a leg spriain that happened a few days before her developing symptoms.  She had a hypercoagulable workup which was positive for lupus anticoagulant and decreased protein C levels.  She was discharge from the hospital on 5/28, and today she is inquiring whether she can come off anticoagulation.  She denies any prior DVTs or blood clots or family history of blood clots in the past.   She denies any melena or hematochezia or bleeding complications while on coumadin.  She has one child without a history of having miscarriages.  Due to recent subtherapeutic INR levels, she was started on 10 mg of coumadin on 06/03/2013.  Her INR on 05/28/2013 was 1.4 and on 06/02/2013 was 1.5.   She had repeat hypercoagulable workup on 06/05/2013 which revealed lupus anticoagulant was not detected.  She was negative for factor V mutation and prothrombin II gene mutation.  Her anticardiolipin were alo negative.  Antithrombin III levels were in normal range.      INTERIM HISTORY: She returns for follow up. She has been compliant with coumadin and po iron pill 3 times daily. She has heavy period, especially on coumadin, it lasts 3-4 days, she needs to change pad every 1-2 hour for the first 2 days, especially when she is on coumadin, no other bleedings.    She had an episode of chest pain on 3/2, and when to ED. Her cardiac workup was negative, CT scan show was negative for PE, and she has some reproducible chest tenderness. It was felt to be  muscular pain, and she was discharged to home. She had no recurrent chest pain.  PAST MEDICAL HISTORY:  has a past medical history of Lung collapse; Pulmonary embolism, blood-clot, obstetric; and DVT (deep venous thrombosis) (12/06/2012).     PAST SURGICAL HISTORY: Past Surgical History  Procedure Laterality Date  . Rib resection     CURRENT MEDICATIONS: has a current medication list which includes the following prescription(s): acetaminophen-codeine, ibuprofen, methocarbamol, and warfarin.   ALLERGIES: Review of patient's allergies indicates no known allergies.   SOCIAL HISTORY:  reports that she has never smoked. She has never used smokeless tobacco. She reports that she drinks alcohol. She reports that she does not use illicit drugs.   FAMILY HISTORY: family history includes COPD in her father; Stroke in her father.   LABORATORY DATA:  No results found for this or any previous visit (from the past 48 hour(s)).   CBC CBC Latest Ref Rng 09/16/2014 09/16/2014 07/18/2014  WBC 3.9 - 10.3 10e3/uL 5.0 8.2 5.9  Hemoglobin 11.6 - 15.9 g/dL 12.0 11.7(L) 8.1(L)  Hematocrit 34.8 - 46.6 % 38.6 37.8 28.2(L)  Platelets 145 - 400 10e3/uL 317 351 335    CMP Latest Ref Rng 09/16/2014 09/16/2014 07/16/2014  Glucose 70 - 140 mg/dl 92 97 103(H)  BUN 7.0 - 26.0 mg/dL 12.8 15 10   Creatinine 0.6 - 1.1 mg/dL 1.0 1.10 0.87  Sodium 136 - 145 mEq/L 139 134(L) 137  Potassium 3.5 - 5.1 mEq/L  4.2 3.8 4.0  Chloride 96 - 112 mmol/L 105 - 106  CO2 22 - 29 mEq/L 22 24 25   Calcium 8.4 - 10.4 mg/dL 8.5 8.7 8.4  Total Protein 6.4 - 8.3 g/dL 6.4 - 7.0  Total Bilirubin 0.20 - 1.20 mg/dL 0.21 - 0.4  Alkaline Phos 40 - 150 U/L 71 - 73  AST 5 - 34 U/L 15 - 17  ALT 0 - 55 U/L 12 - 17   Ferritin  Status: Finalresult Visible to patient:  Not Released Nextappt: 01/19/2015 at 03:30 PM in Oncology [facility] 1) Dx:  Iron deficiency anemia due to chronic...           Ref Range 6d ago  51mo ago   47yr ago     Ferritin 9 - 269 ng/ml 14 8 (L)R, CM 13R           RADIOGRAPHY:  Summary:  - Findings consistent with deep vein thrombosis involving the right posterial tibial vein and right peroneal vein. - No evidence of deep vein thrombosis involving the left lower extremity. Other specific details can be found in the table(s) above. Prepared and Electronically Authenticated by  REVIEW OF SYSTEMS:  Constitutional: Denies fevers, chills or abnormal weight loss Eyes: Denies blurriness of vision Ears, nose, mouth, throat, and face: Denies mucositis or sore throat Respiratory: Denies cough, dyspnea or wheezes Cardiovascular: Denies palpitation, chest discomfort or lower extremity swelling Gastrointestinal:  Denies nausea, heartburn or change in bowel habits Skin: Denies abnormal skin rashes Lymphatics: Denies new lymphadenopathy or easy bruising Neurological:Denies numbness, tingling or new weaknesses Behavioral/Psych: Mood is stable, no new changes  All other systems were reviewed with the patient and are negative.  PHYSICAL EXAM:  height is 5\' 5"  (1.651 m) and weight is 272 lb 12.8 oz (123.741 kg). Her oral temperature is 98.5 F (36.9 C). Her blood pressure is 103/80 and her pulse is 84. Her respiration is 21 and oxygen saturation is 100%.   GENERAL:alert, no distress and comfortable; Moderately obese.  SKIN: skin color, texture, turgor are normal, no rashes or significant lesions EYES: normal, Conjunctiva are pink and non-injected, sclera clear OROPHARYNX:no exudate, no erythema and lips, buccal mucosa, and tongue normal  NECK: supple, thyroid normal size, non-tender, without nodularity LYMPH:  no palpable lymphadenopathy in the cervical, axillary or inguinal LUNGS: clear to auscultation and percussion with normal breathing effort HEART: regular rate & rhythm and no murmurs and no lower extremity edema ABDOMEN:abdomen soft, non-tender and normal bowel  sounds Musculoskeletal:no cyanosis of digits and no clubbing  NEURO: alert & oriented x 3 with fluent speech, no focal motor/sensory deficits   IMPRESSION: Donna [name] is a 44 y.o. female with a history of   RADIOLOGY CT chest 09/16/2014 IMPRESSION: 1. Negative for pulmonary embolism. 2. 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 Recommend 1 year surveillance imaging, which could be arranged through the [facility].   PLAN:  1.  Recurrent DVT/PE.  --She had provoked DVT and PE in 2014, and second episode off on provoked TE in December 2015 after she was off Coumadin. --Her PTT Lupus anticoagulant was 89.1 on 12/05/2012 and decreased Protein C levels.  Her reepat lupus anticoagulant is negative without detection of a lupus anticoagulant. Hypercoagulopathy workup was negative when she was off Coumadin. -I recommend anticoagulation for life long if there is no contraindication such as bleeding. -We discussed Different options of anticoagulation. She could not tolerate Xarelto. She had some skin thickening in the neck, questionable  skin necrosis from coumadin, resolved now  -she is tolerating Coumadin well, we'll continue and follow up in our Coumadin clinic.   2. Anemia of iron deficiency, likely secondary to menorrhagia -She has fairly low ferritin and iron level, all consistent with iron deficiency. -She had good response to oral iron supplement, anemia significantly improved. Ferritin level trended up. -continue oral iron 3 tablets daily, I suggest her to continue for 6 months, and until ferritin level normalize before we reduce the dosage.   3. Obesity. -- Continued dieting and exercises per her PCP.   4. Episode of chest pain on 09/17/2014 -likely muscular in nature -She will follow-up with her primary care physician   Follow-up. -Return in 4 months with repeated CBC and iron study the week before     Truitt Merle, MD 09/22/2014   2:30  PM

## 2014-09-22 NOTE — Telephone Encounter (Signed)
Pt confirmed labs/ov per 03/08 POF, gave pt AVS..... KJ °

## 2014-09-24 ENCOUNTER — Ambulatory Visit: Payer: Self-pay | Attending: Family Medicine | Admitting: Pharmacist

## 2014-09-24 DIAGNOSIS — I2699 Other pulmonary embolism without acute cor pulmonale: Secondary | ICD-10-CM

## 2014-09-24 LAB — POCT INR: INR: 1.2

## 2014-09-29 ENCOUNTER — Ambulatory Visit: Payer: Self-pay | Attending: Family Medicine | Admitting: Pharmacist

## 2014-09-29 DIAGNOSIS — I8290 Acute embolism and thrombosis of unspecified vein: Secondary | ICD-10-CM

## 2014-09-29 LAB — POCT INR: INR: 2

## 2014-10-06 ENCOUNTER — Ambulatory Visit: Payer: Self-pay | Attending: Family Medicine | Admitting: Pharmacist

## 2014-10-06 DIAGNOSIS — I2699 Other pulmonary embolism without acute cor pulmonale: Secondary | ICD-10-CM

## 2014-10-06 LAB — POCT INR: INR: 1.4

## 2014-10-13 ENCOUNTER — Ambulatory Visit: Payer: Self-pay | Attending: Family Medicine | Admitting: Pharmacist

## 2014-10-13 DIAGNOSIS — I2699 Other pulmonary embolism without acute cor pulmonale: Secondary | ICD-10-CM

## 2014-10-13 LAB — POCT INR: INR: 2.7

## 2014-10-13 MED ORDER — WARFARIN SODIUM 10 MG PO TABS: 10.0000 mg | ORAL_TABLET | ORAL | Status: DC

## 2014-10-20 ENCOUNTER — Ambulatory Visit: Payer: Self-pay | Attending: Family Medicine | Admitting: Pharmacist

## 2014-10-20 ENCOUNTER — Ambulatory Visit (HOSPITAL_BASED_OUTPATIENT_CLINIC_OR_DEPARTMENT_OTHER): Payer: Self-pay | Admitting: Family Medicine

## 2014-10-20 DIAGNOSIS — B86 Scabies: Secondary | ICD-10-CM

## 2014-10-20 LAB — POCT INR: INR: 2.3

## 2014-10-20 MED ORDER — PERMETHRIN 5 % EX CREA
1.0000 | TOPICAL_CREAM | Freq: Once | CUTANEOUS | Status: DC
Start: 2014-10-20 — End: 2015-01-26

## 2014-10-20 MED ORDER — PERMETHRIN 5 % EX CREA: 1.0000 "application " | TOPICAL_CREAM | Freq: Once | CUTANEOUS | Status: DC

## 2014-10-20 NOTE — Patient Instructions (Signed)
Apply cream all over one time. May repeat if necessary in 2 weeks. Wash all bedding in hot waterScabies Scabies are small bugs (mites) that burrow under the skin and cause red bumps and severe itching. These bugs can only be seen with a microscope. Scabies are highly contagious. They can spread easily from person to person by direct contact. They are also spread through sharing clothing or linens that have the scabies mites living in them. It is not unusual for an entire family to become infected through shared towels, clothing, or bedding.  HOME CARE INSTRUCTIONS   Your caregiver may prescribe a cream or lotion to kill the mites. If cream is prescribed, massage the cream into the entire body from the neck to the bottom of both feet. Also massage the cream into the scalp and face if your child is less than 27 year old. Avoid the eyes and mouth. Do not wash your hands after application.  Leave the cream on for 8 to 12 hours. Your child should bathe or shower after the 8 to 12 hour application period. Sometimes it is helpful to apply the cream to your child right before bedtime.  One treatment is usually effective and will eliminate approximately 95% of infestations. For severe cases, your caregiver may decide to repeat the treatment in 1 week. Everyone in your household should be treated with one application of the cream.  New rashes or burrows should not appear within 24 to 48 hours after successful treatment. However, the itching and rash may last for 2 to 4 weeks after successful treatment. Your caregiver may prescribe a medicine to help with the itching or to help the rash go away more quickly.  Scabies can live on clothing or linens for up to 3 days. All of your child's recently used clothing, towels, stuffed toys, and bed linens should be washed in hot water and then dried in a dryer for at least 20 minutes on high heat. Items that cannot be washed should be enclosed in a plastic bag for at least 3  days.  To help relieve itching, bathe your child in a cool bath or apply cool washcloths to the affected areas.  Your child may return to school after treatment with the prescribed cream. SEEK MEDICAL CARE IF:   The itching persists longer than 4 weeks after treatment.  The rash spreads or becomes infected. Signs of infection include red blisters or yellow-tan crust. Document Released: 07/03/2005 Document Revised: 09/25/2011 Document Reviewed: 11/11/2008 [facility] Patient Information 2015 [place], [place]. This information is not intended to replace advice given to you by your health care provider. Make sure you discuss any questions you have with your health care provider.

## 2014-10-20 NOTE — Progress Notes (Signed)
Grandson diagnosed yesterday with scabies.  Patient presents to clinic with small red itchy bumps all over her body.  She was told by her grandson's MD that everyone in the family should be treated.

## 2014-10-20 NOTE — Progress Notes (Signed)
Patient ID: Donna [name], female   DOB: 12-May-1971, 44 y.o.   MRN: [id number] CC:  Scabies  Subjective:  Patient has two family members with scabies and presents with bumps and itching for 2 days. Was told the entire family should be treated.  Objective:  Scatter papules some in liner formation.  Asessment:  Scabies  Plan:   Permethrin 5% cream to be apply to entire body times one. May repeat in 14 days if needed. Provide handout on getting rid of scabies from the home.   Micheline Chapman, FNP-BC

## 2014-11-03 ENCOUNTER — Ambulatory Visit: Payer: Self-pay | Attending: Family Medicine | Admitting: Pharmacist

## 2014-11-03 DIAGNOSIS — I2699 Other pulmonary embolism without acute cor pulmonale: Secondary | ICD-10-CM

## 2014-11-03 LAB — POCT INR: INR: 2

## 2014-11-03 MED ORDER — WARFARIN SODIUM 5 MG PO TABS: ORAL_TABLET | ORAL | Status: DC

## 2014-11-24 ENCOUNTER — Ambulatory Visit: Payer: Self-pay | Attending: Family Medicine | Admitting: Pharmacist

## 2014-11-24 DIAGNOSIS — I2699 Other pulmonary embolism without acute cor pulmonale: Secondary | ICD-10-CM

## 2014-11-24 LAB — POCT INR: INR: 3.1

## 2014-12-17 ENCOUNTER — Telehealth: Payer: Self-pay | Admitting: Pharmacist

## 2014-12-17 NOTE — Telephone Encounter (Signed)
Pt appt for today instead on next Tuesday

## 2014-12-18 ENCOUNTER — Ambulatory Visit: Payer: Self-pay | Attending: Family Medicine | Admitting: Pharmacist

## 2014-12-18 DIAGNOSIS — I2699 Other pulmonary embolism without acute cor pulmonale: Secondary | ICD-10-CM

## 2014-12-18 LAB — POCT INR: INR: 2.4

## 2015-01-14 ENCOUNTER — Ambulatory Visit: Payer: Self-pay | Attending: Family Medicine | Admitting: Pharmacist

## 2015-01-14 DIAGNOSIS — I2699 Other pulmonary embolism without acute cor pulmonale: Secondary | ICD-10-CM

## 2015-01-14 LAB — POCT INR: INR: 3

## 2015-01-14 MED ORDER — WARFARIN SODIUM 5 MG PO TABS: ORAL_TABLET | ORAL | Status: DC

## 2015-01-19 ENCOUNTER — Other Ambulatory Visit (HOSPITAL_BASED_OUTPATIENT_CLINIC_OR_DEPARTMENT_OTHER): Payer: Self-pay

## 2015-01-19 DIAGNOSIS — D500 Iron deficiency anemia secondary to blood loss (chronic): Secondary | ICD-10-CM

## 2015-01-19 DIAGNOSIS — D509 Iron deficiency anemia, unspecified: Secondary | ICD-10-CM

## 2015-01-19 LAB — CBC & DIFF AND RETIC
BASO%: 0.5 % (ref 0.0–2.0)
Basophils Absolute: 0 10*3/uL (ref 0.0–0.1)
EOS ABS: 0.1 10*3/uL (ref 0.0–0.5)
EOS%: 1.8 % (ref 0.0–7.0)
HEMATOCRIT: 40 % (ref 34.8–46.6)
HGB: 13.2 g/dL (ref 11.6–15.9)
Immature Retic Fract: 10.2 % — ABNORMAL HIGH (ref 1.60–10.00)
LYMPH%: 45 % (ref 14.0–49.7)
MCH: 28.4 pg (ref 25.1–34.0)
MCHC: 33 g/dL (ref 31.5–36.0)
MCV: 86.2 fL (ref 79.5–101.0)
MONO#: 0.4 10*3/uL (ref 0.1–0.9)
MONO%: 8.9 % (ref 0.0–14.0)
NEUT#: 1.9 10*3/uL (ref 1.5–6.5)
NEUT%: 43.8 % (ref 38.4–76.8)
PLATELETS: 280 10*3/uL (ref 145–400)
RBC: 4.64 10*6/uL (ref 3.70–5.45)
RDW: 16.2 % — ABNORMAL HIGH (ref 11.2–14.5)
RETIC %: 2.3 % — AB (ref 0.70–2.10)
Retic Ct Abs: 106.72 10*3/uL — ABNORMAL HIGH (ref 33.70–90.70)
WBC: 4.4 10*3/uL (ref 3.9–10.3)
lymph#: 2 10*3/uL (ref 0.9–3.3)

## 2015-01-19 LAB — COMPREHENSIVE METABOLIC PANEL (CC13)
ALK PHOS: 57 U/L (ref 40–150)
ALT: 22 U/L (ref 0–55)
AST: 16 U/L (ref 5–34)
Albumin: 3 g/dL — ABNORMAL LOW (ref 3.5–5.0)
Anion Gap: 6 mEq/L (ref 3–11)
BILIRUBIN TOTAL: 0.3 mg/dL (ref 0.20–1.20)
BUN: 11.6 mg/dL (ref 7.0–26.0)
CO2: 22 meq/L (ref 22–29)
CREATININE: 0.9 mg/dL (ref 0.6–1.1)
Calcium: 8.9 mg/dL (ref 8.4–10.4)
Chloride: 108 mEq/L (ref 98–109)
EGFR: 87 mL/min/{1.73_m2} — AB (ref >90)
GLUCOSE: 91 mg/dL (ref 70–140)
Potassium: 4.1 mEq/L (ref 3.5–5.1)
Sodium: 136 mEq/L (ref 136–145)
Total Protein: 6.5 g/dL (ref 6.4–8.3)

## 2015-01-20 LAB — FERRITIN CHCC: Ferritin: 17 ng/ml (ref 9–269)

## 2015-01-20 LAB — IRON AND TIBC CHCC
%SAT: 14 % — ABNORMAL LOW (ref 21–57)
IRON: 43 ug/dL (ref 41–142)
TIBC: 303 ug/dL (ref 236–444)
UIBC: 259 ug/dL (ref 120–384)

## 2015-01-26 ENCOUNTER — Encounter: Payer: Self-pay | Admitting: Hematology

## 2015-01-26 ENCOUNTER — Telehealth: Payer: Self-pay | Admitting: Hematology

## 2015-01-26 ENCOUNTER — Ambulatory Visit (HOSPITAL_BASED_OUTPATIENT_CLINIC_OR_DEPARTMENT_OTHER): Payer: Self-pay | Admitting: Hematology

## 2015-01-26 VITALS — BP 114/75 | HR 81 | Temp 98.4°F | Resp 18 | Ht 69.0 in | Wt 269.7 lb

## 2015-01-26 DIAGNOSIS — D509 Iron deficiency anemia, unspecified: Secondary | ICD-10-CM

## 2015-01-26 DIAGNOSIS — I2699 Other pulmonary embolism without acute cor pulmonale: Secondary | ICD-10-CM

## 2015-01-26 DIAGNOSIS — R079 Chest pain, unspecified: Secondary | ICD-10-CM

## 2015-01-26 DIAGNOSIS — N920 Excessive and frequent menstruation with regular cycle: Secondary | ICD-10-CM

## 2015-01-26 NOTE — Progress Notes (Signed)
[place] [phone]   [phone]  Follow up note    Name: Donna [name] Date: 01/26/2015 MRN: [id number] DOB: 1970/12/10  PCP: Minerva Ends, MD   REFERRING PHYSICIAN: Boykin Nearing, MD  REASON FOR REFERRAL: recurrent DVT/PE, iron deficient anemia    HISTORY OF PRESENT ILLNESS:Donna [name] is a 44 y.o. 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  Her DVT was thought to have been provoked by a leg spriain that happened a few days before her developing symptoms.  She had a hypercoagulable workup which was positive for lupus anticoagulant and decreased protein C levels.  She was discharge from the hospital on 5/28, and today she is inquiring whether she can come off anticoagulation.  She denies any prior DVTs or blood clots or family history of blood clots in the past.   She denies any melena or hematochezia or bleeding complications while on coumadin.  She has one child without a history of having miscarriages.  Due to recent subtherapeutic INR levels, she was started on 10 mg of coumadin on 06/03/2013.  Her INR on 05/28/2013 was 1.4 and on 06/02/2013 was 1.5.   She had repeat hypercoagulable workup on 06/05/2013 which revealed lupus anticoagulant was not detected.  She was negative for factor V mutation and prothrombin II gene mutation.  Her anticardiolipin were alo negative.  Antithrombin III levels were in normal range.      INTERIM HISTORY: She returns for follow up. She has been compliant with coumadin and po iron pill (with orange juice) 3 times daily. She is tolerating both well. No significant constipation. She still has heavy period, especially on coumadin, it lasts 3-4 days, she needs to change pad every 1-2 hour for the first 2 days, especially when she is on coumadin, no other bleedings.    She still has intermittent mild left chest pain, which was evaluated at ED before and felt to be muscular related.  She takes methocarbamol as needed which helps. No other new complains.  PAST MEDICAL HISTORY:  has a past medical history of Lung collapse; Pulmonary embolism, blood-clot, obstetric; and DVT (deep venous thrombosis) (12/06/2012).     PAST SURGICAL HISTORY: Past Surgical History  Procedure Laterality Date  . Rib resection     CURRENT MEDICATIONS Current Outpatient Prescriptions on File Prior to Visit  Medication Sig Dispense Refill  . warfarin (COUMADIN) 5 MG tablet Take as directed per coumadin clinic 90 tablet 3  . methocarbamol (ROBAXIN) 500 MG tablet Take 2 tablets (1,000 mg total) by mouth every 8 (eight) hours as needed for muscle spasms. (Patient not taking: Reported on 01/26/2015) 30 tablet 0   No current facility-administered medications on file prior to visit.  ;  ALLERGIES: Review of patient's allergies indicates no known allergies.   SOCIAL HISTORY:  reports that she has never smoked. She has never used smokeless tobacco. She reports that she drinks alcohol. She reports that she does not use illicit drugs.   FAMILY HISTORY: family history includes COPD in her father; Stroke in her father.   LABORATORY DATA:  No results found for this or any previous visit (from the past 48 hour(s)).   CBC CBC Latest Ref Rng 01/19/2015 09/16/2014 09/16/2014  WBC 3.9 - 10.3 10e3/uL 4.4 5.0 8.2  Hemoglobin 11.6 - 15.9 g/dL 13.2 12.0 11.7(L)  Hematocrit 34.8 - 46.6 % 40.0 38.6 37.8  Platelets 145 - 400 10e3/uL 280 317 351  CMP Latest Ref Rng 01/19/2015 09/16/2014 09/16/2014  Glucose 70 - 140 mg/dl 91 92 97  BUN 7.0 - 26.0 mg/dL 11.6 12.8 15  Creatinine 0.6 - 1.1 mg/dL 0.9 1.0 1.10  Sodium 136 - 145 mEq/L 136 139 134(L)  Potassium 3.5 - 5.1 mEq/L 4.1 4.2 3.8  Chloride 96 - 112 mmol/L - 105 -  CO2 22 - 29 mEq/L 22 22 24   Calcium 8.4 - 10.4 mg/dL 8.9 8.5 8.7  Total Protein 6.4 - 8.3 g/dL 6.5 6.4 -  Total Bilirubin 0.20 - 1.20 mg/dL 0.30 0.21 -  Alkaline Phos 40 - 150 U/L 57 71 -  AST 5 - 34  U/L 16 15 -  ALT 0 - 55 U/L 22 12 -   Ferritin  Status: Finalresult Visible to patient:  Not Released Nextappt: 01/19/2015 at 03:30 PM in Oncology [facility] 1) Dx:  Iron deficiency anemia due to chronic...           Ref Range 6d ago  39mo ago  67yr ago     Ferritin 9 - 269 ng/ml 14 8 (L)R, CM 13R           REVIEW OF SYSTEMS:  Constitutional: Denies fevers, chills or abnormal weight loss Eyes: Denies blurriness of vision Ears, nose, mouth, throat, and face: Denies mucositis or sore throat Respiratory: Denies cough, dyspnea or wheezes Cardiovascular: Denies palpitation, chest discomfort or lower extremity swelling Gastrointestinal:  Denies nausea, heartburn or change in bowel habits Skin: Denies abnormal skin rashes Lymphatics: Denies new lymphadenopathy or easy bruising Neurological:Denies numbness, tingling or new weaknesses Behavioral/Psych: Mood is stable, no new changes  All other systems were reviewed with the patient and are negative.  PHYSICAL EXAM:  height is 5\' 9"  (1.753 m) and weight is 269 lb 11.2 oz (122.335 kg). Her oral temperature is 98.4 F (36.9 C). Her blood pressure is 114/75 and her pulse is 81. Her respiration is 18 and oxygen saturation is 99%.   GENERAL:alert, no distress and comfortable; Moderately obese.  SKIN: skin color, texture, turgor are normal, no rashes or significant lesions EYES: normal, Conjunctiva are pink and non-injected, sclera clear OROPHARYNX:no exudate, no erythema and lips, buccal mucosa, and tongue normal  NECK: supple, thyroid normal size, non-tender, without nodularity LYMPH:  no palpable lymphadenopathy in the cervical, axillary or inguinal LUNGS: clear to auscultation and percussion with normal breathing effort HEART: regular rate & rhythm and no murmurs and no lower extremity edema ABDOMEN:abdomen soft, non-tender and normal bowel sounds Musculoskeletal:no cyanosis of digits and no clubbing  NEURO:  alert & oriented x 3 with fluent speech, no focal motor/sensory deficits  Radiology  No new scans    IMPRESSION: Donna [name] is a 44 y.o. female with a history of    PLAN:  1.  Recurrent DVT/PE.  --She had provoked DVT and PE in 2014, and second episode off on provoked TE in December 2015 after she was off Coumadin. --Her PTT Lupus anticoagulant was 89.1 on 12/05/2012 and decreased Protein C levels.  Her repeated lupus anticoagulant is negative without detection of a lupus anticoagulant. Hypercoagulopathy workup was negative when she was off Coumadin. -I recommend anticoagulation for life long if there is no contraindication such as bleeding. -We discussed Different options of anticoagulation. She could not tolerate Xarelto. She had some skin thickening in the neck, questionable skin necrosis from coumadin, resolved now  -Preventive strategy such as avoid cigarette smoking, contraceptives, and aware compression stocks on [employer]. She Voiced  Good Understanding. -she is tolerating Coumadin well, we'll continue and follow up in Coumadin clinic.   2. Anemia of iron deficiency, likely secondary to menorrhagia -She has fairly low ferritin and iron level, all consistent with iron deficiency. -She had good response to oral iron supplement, anemia significantly improved. Hb 13.2 tiday. Ferritin level trended up, 14 last week.  -continue oral iron 3 tablets daily, she is tolerating it well   3. Obesity. -- Continued dieting and exercises per her PCP.   4. Episode of chest pain on 09/17/2014 -likely muscular in nature -She will follow-up with her primary care physician   Follow-up. -Return in 6 months with repeated CBC and iron study the week before     Truitt Merle, MD 01/26/2015   3:23 PM

## 2015-01-26 NOTE — Telephone Encounter (Signed)
Gave patient avs report and appointments for January 2017. °

## 2015-01-28 ENCOUNTER — Emergency Department (HOSPITAL_COMMUNITY): Payer: Medicaid Other

## 2015-01-28 ENCOUNTER — Encounter (HOSPITAL_COMMUNITY): Payer: Self-pay

## 2015-01-28 ENCOUNTER — Emergency Department (HOSPITAL_COMMUNITY)
Admission: EM | Admit: 2015-01-28 | Discharge: 2015-01-29 | Disposition: A | Discharge status: Home / Self Care | Payer: Self-pay | Attending: Emergency Medicine | Admitting: Emergency Medicine

## 2015-01-28 DIAGNOSIS — Z86711 Personal history of pulmonary embolism: Secondary | ICD-10-CM | POA: Insufficient documentation

## 2015-01-28 DIAGNOSIS — Z7901 Long term (current) use of anticoagulants: Secondary | ICD-10-CM | POA: Insufficient documentation

## 2015-01-28 DIAGNOSIS — Z86718 Personal history of other venous thrombosis and embolism: Secondary | ICD-10-CM | POA: Insufficient documentation

## 2015-01-28 DIAGNOSIS — J020 Streptococcal pharyngitis: Secondary | ICD-10-CM | POA: Insufficient documentation

## 2015-01-28 LAB — CBC WITH DIFFERENTIAL/PLATELET
Basophils Absolute: 0 10*3/uL (ref 0.0–0.1)
Basophils Relative: 0 % (ref 0–1)
Eosinophils Absolute: 0 10*3/uL (ref 0.0–0.7)
Eosinophils Relative: 0 % (ref 0–5)
HCT: 41 % (ref 36.0–46.0)
Hemoglobin: 13.6 g/dL (ref 12.0–15.0)
Lymphocytes Relative: 10 % — ABNORMAL LOW (ref 12–46)
Lymphs Abs: 1.8 10*3/uL (ref 0.7–4.0)
MCH: 28.4 pg (ref 26.0–34.0)
MCHC: 33.2 g/dL (ref 30.0–36.0)
MCV: 85.6 fL (ref 78.0–100.0)
Monocytes Absolute: 1.8 10*3/uL — ABNORMAL HIGH (ref 0.1–1.0)
Monocytes Relative: 10 % (ref 3–12)
Neutro Abs: 14 10*3/uL — ABNORMAL HIGH (ref 1.7–7.7)
Neutrophils Relative %: 80 % — ABNORMAL HIGH (ref 43–77)
Platelets: 308 10*3/uL (ref 150–400)
RBC: 4.79 MIL/uL (ref 3.87–5.11)
RDW: 15.9 % — ABNORMAL HIGH (ref 11.5–15.5)
WBC: 17.6 10*3/uL — ABNORMAL HIGH (ref 4.0–10.5)

## 2015-01-28 LAB — BASIC METABOLIC PANEL
Anion gap: 8 (ref 5–15)
BUN: 9 mg/dL (ref 6–20)
CO2: 22 mmol/L (ref 22–32)
Calcium: 8.6 mg/dL — ABNORMAL LOW (ref 8.9–10.3)
Chloride: 103 mmol/L (ref 101–111)
Creatinine, Ser: 1.07 mg/dL — ABNORMAL HIGH (ref 0.44–1.00)
GFR calc Af Amer: >60 mL/min (ref >60)
GFR calc non Af Amer: >60 mL/min (ref >60)
Glucose, Bld: 106 mg/dL — ABNORMAL HIGH (ref 65–99)
Potassium: 3.5 mmol/L (ref 3.5–5.1)
Sodium: 133 mmol/L — ABNORMAL LOW (ref 135–145)

## 2015-01-28 LAB — RAPID STREP SCREEN (MED CTR MEBANE ONLY): Streptococcus, Group A Screen (Direct): POSITIVE — AB

## 2015-01-28 MED ORDER — PENICILLIN G BENZATHINE 1200000 UNIT/2ML IM SUSP
1.2000 10*6.[IU] | Freq: Once | INTRAMUSCULAR | Status: AC
  Administered 2015-01-28: 1.2 10*6.[IU] via INTRAMUSCULAR
  Filled 2015-01-28: qty 2

## 2015-01-28 MED ORDER — FENTANYL CITRATE (PF) 100 MCG/2ML IJ SOLN
100.0000 ug | Freq: Once | INTRAMUSCULAR | Status: AC
  Administered 2015-01-28: 100 ug via INTRAVENOUS
  Filled 2015-01-28: qty 2

## 2015-01-28 MED ORDER — SODIUM CHLORIDE 0.9 % IV BOLUS (SEPSIS)
1000.0000 mL | Freq: Once | INTRAVENOUS | Status: AC
  Administered 2015-01-28: 1000 mL via INTRAVENOUS

## 2015-01-28 MED ORDER — ACETAMINOPHEN 325 MG PO TABS
650.0000 mg | ORAL_TABLET | Freq: Once | ORAL | Status: AC
  Administered 2015-01-28: 650 mg via ORAL
  Filled 2015-01-28: qty 2

## 2015-01-28 MED ORDER — ACETAMINOPHEN-CODEINE 120-12 MG/5ML PO SOLN: 10.0000 mL | ORAL | Status: DC | PRN

## 2015-01-28 NOTE — ED Provider Notes (Signed)
CSN: [id number]     Arrival date & time 01/28/15  1809 History   First MD Initiated Contact with Patient 01/28/15 2101     Chief Complaint  Patient presents with  . Sore Throat  . Generalized Body Aches     (Consider location/radiation/quality/duration/timing/severity/associated sxs/prior Treatment) HPI Patient presents to the emergency department with sore throat and body aches.  Patient states that she had a feeling bad 1 day ago.  The patient states that she did not take any medications prior to arrival a denies chest pain, shortness of breath, cough, runny nose, back pain, dysuria, abdominal pain, nausea, vomiting, diarrhea, or syncope.  The patient states that swallowing makes the pain worse in her throat Past Medical History  Diagnosis Date  . Lung collapse   . Pulmonary embolism, blood-clot, obstetric   . DVT (deep venous thrombosis) 12/06/2012   Past Surgical History  Procedure Laterality Date  . Rib resection     Family History  Problem Relation Age of Onset  . COPD Father   . Stroke Father    History  Substance Use Topics  . Smoking status: Never Smoker   . Smokeless tobacco: Never Used  . Alcohol Use: Yes     Comment: socially    OB History    Gravida Para Term Preterm AB TAB SAB Ectopic Multiple Living   1 1 1  0 0 0 0 0 0 1     Review of Systems  All other systems negative except as documented in the HPI. All pertinent positives and negatives as reviewed in the HPI.=  Allergies  Review of patient's allergies indicates no known allergies.  Home Medications   Prior to Admission medications   Medication Sig Start Date End Date Taking? Authorizing Provider  acetaminophen (TYLENOL) 325 MG tablet Take 650 mg by mouth as needed.   Yes Historical Provider, MD  methocarbamol (ROBAXIN) 500 MG tablet Take 2 tablets (1,000 mg total) by mouth every 8 (eight) hours as needed for muscle spasms. 09/16/14  Yes Julianne Rice, MD  warfarin (COUMADIN) 5 MG tablet Take as  directed per coumadin clinic Patient taking differently: 10-15 mg daily at 6 PM. Take 2 tablets (10mg ) by mouth daily on MON,WED,THU,FRI,SAT,SUN. Take 3 tablets (15mg ) by mouth daily on TUESDAYs. As directed by coumadin clinic. 01/14/15  Yes Josalyn Funches, MD   BP 122/78 mmHg  Pulse 108  Temp(Src) 102.5 F (39.2 C) (Oral)  Resp 21  SpO2 98%  LMP 01/24/2015 Physical Exam  Constitutional: She is oriented to person, place, and time. She appears well-developed and well-nourished. No distress.  HENT:  Head: Normocephalic and atraumatic.  Mouth/Throat: No uvula swelling. Posterior oropharyngeal erythema present. No oropharyngeal exudate.  Eyes: Pupils are equal, round, and reactive to light.  Neck: Normal range of motion. Neck supple.  Cardiovascular: Normal rate, regular rhythm and normal heart sounds.  Exam reveals no gallop and no friction rub.   No murmur heard. Pulmonary/Chest: Effort normal and breath sounds normal. No respiratory distress. She has no wheezes.  Neurological: She is alert and oriented to person, place, and time. She exhibits normal muscle tone. Coordination normal.  Skin: Skin is warm and dry. No rash noted. No erythema.  Psychiatric: She has a normal mood and affect. Her behavior is normal.  Nursing note and vitals reviewed.   ED Course  Procedures (including critical care time) Labs Review Labs Reviewed  RAPID STREP SCREEN (NOT AT [facility]) - Abnormal; Notable for the following:  Streptococcus, Group A Screen (Direct) POSITIVE (*)    All other components within normal limits  BASIC METABOLIC PANEL - Abnormal; Notable for the following:    Sodium 133 (*)    Glucose, Bld 106 (*)    Creatinine, Ser 1.07 (*)    Calcium 8.6 (*)    All other components within normal limits  CBC WITH DIFFERENTIAL/PLATELET - Abnormal; Notable for the following:    WBC 17.6 (*)    RDW 15.9 (*)    Neutrophils Relative % 80 (*)    Neutro Abs 14.0 (*)    Lymphocytes Relative 10 (*)     Monocytes Absolute 1.8 (*)    All other components within normal limits    Imaging Review Dg Chest 2 View  01/28/2015   CLINICAL DATA:  Cough and chest tightness for the past several days. Fever this p.m.  EXAM: CHEST  2 VIEW  COMPARISON:  09/16/2014  FINDINGS: Slightly shallow inspiration. The heart size and mediastinal contours are within normal limits. Both lungs are clear. The visualized skeletal structures are unremarkable. Surgical clips in the left axilla.  IMPRESSION: No active cardiopulmonary disease.   Electronically Signed   By: Lucienne Capers M.D.   On: 01/28/2015 23:05    Patient be treated for strep pharyngitis.  Told to return here as needed.  Told to increase her fluid intake and rest as much as possible.  I advised follow-up with her primary care doctor  Dalia Heading, PA-C 02/01/15 0111  Noemi Chapel, MD 02/02/15 [phone]

## 2015-01-28 NOTE — ED Notes (Signed)
Pt states since yesterday she has had a score throat and body aches.  Took tylenol with no relief.  Unknown for fever at home.

## 2015-01-28 NOTE — Discharge Instructions (Signed)
Return here as needed.  Increase your fluid intake, rest as much as possible

## 2015-02-11 ENCOUNTER — Ambulatory Visit: Payer: Self-pay | Attending: Family Medicine | Admitting: Pharmacist

## 2015-02-11 DIAGNOSIS — Z7901 Long term (current) use of anticoagulants: Secondary | ICD-10-CM

## 2015-02-11 LAB — POCT INR: INR: 6.1

## 2015-02-11 NOTE — Progress Notes (Signed)
Patient with INR of 6.1  Recently given penicillin injection (7/14) for infection. Also has not had good food intake due to sore throat.   Will hold warfarin today and tomorrow and decrease the weekly dose by about 10% until we are sure the effects of the penicillin are gone. Patient does not have any active bleeding at this time.   Patient instructed to seek immediate medical attention for any signs/symptoms of bleeding. Patient to return for INR check 02/16/15.

## 2015-02-16 ENCOUNTER — Ambulatory Visit: Payer: Self-pay | Attending: Family Medicine | Admitting: Pharmacist

## 2015-02-16 DIAGNOSIS — Z7901 Long term (current) use of anticoagulants: Secondary | ICD-10-CM

## 2015-02-16 LAB — POCT INR: INR: 1.1

## 2015-02-19 ENCOUNTER — Emergency Department (HOSPITAL_COMMUNITY): Payer: Medicaid Other

## 2015-02-19 ENCOUNTER — Encounter (HOSPITAL_COMMUNITY): Payer: Self-pay

## 2015-02-19 ENCOUNTER — Emergency Department (HOSPITAL_COMMUNITY)
Admission: EM | Admit: 2015-02-19 | Discharge: 2015-02-19 | Disposition: A | Discharge status: Home / Self Care | Payer: Medicaid Other | Attending: Emergency Medicine | Admitting: Emergency Medicine

## 2015-02-19 DIAGNOSIS — Z86711 Personal history of pulmonary embolism: Secondary | ICD-10-CM | POA: Insufficient documentation

## 2015-02-19 DIAGNOSIS — Z8709 Personal history of other diseases of the respiratory system: Secondary | ICD-10-CM | POA: Insufficient documentation

## 2015-02-19 DIAGNOSIS — R109 Unspecified abdominal pain: Secondary | ICD-10-CM | POA: Insufficient documentation

## 2015-02-19 DIAGNOSIS — Z86718 Personal history of other venous thrombosis and embolism: Secondary | ICD-10-CM | POA: Insufficient documentation

## 2015-02-19 DIAGNOSIS — Z7901 Long term (current) use of anticoagulants: Secondary | ICD-10-CM | POA: Insufficient documentation

## 2015-02-19 LAB — COMPREHENSIVE METABOLIC PANEL
ALT: 13 U/L — AB (ref 14–54)
AST: 14 U/L — AB (ref 15–41)
Albumin: 3.1 g/dL — ABNORMAL LOW (ref 3.5–5.0)
Alkaline Phosphatase: 59 U/L (ref 38–126)
Anion gap: 6 (ref 5–15)
BILIRUBIN TOTAL: 0.5 mg/dL (ref 0.3–1.2)
BUN: 10 mg/dL (ref 6–20)
CO2: 24 mmol/L (ref 22–32)
Calcium: 8.4 mg/dL — ABNORMAL LOW (ref 8.9–10.3)
Chloride: 106 mmol/L (ref 101–111)
Creatinine, Ser: 0.95 mg/dL (ref 0.44–1.00)
GFR calc Af Amer: >60 mL/min (ref >60)
GFR calc non Af Amer: >60 mL/min (ref >60)
Glucose, Bld: 96 mg/dL (ref 65–99)
POTASSIUM: 3.9 mmol/L (ref 3.5–5.1)
Sodium: 136 mmol/L (ref 135–145)
Total Protein: 7.1 g/dL (ref 6.5–8.1)

## 2015-02-19 LAB — PROTIME-INR
INR: 2.07 — ABNORMAL HIGH (ref 0.00–1.49)
Prothrombin Time: 23.1 seconds — ABNORMAL HIGH (ref 11.6–15.2)

## 2015-02-19 LAB — CBC WITH DIFFERENTIAL/PLATELET
BASOS ABS: 0 10*3/uL (ref 0.0–0.1)
Basophils Relative: 0 % (ref 0–1)
Eosinophils Absolute: 0.1 10*3/uL (ref 0.0–0.7)
Eosinophils Relative: 2 % (ref 0–5)
HCT: 39.2 % (ref 36.0–46.0)
Hemoglobin: 12.6 g/dL (ref 12.0–15.0)
Lymphocytes Relative: 46 % (ref 12–46)
Lymphs Abs: 2.2 10*3/uL (ref 0.7–4.0)
MCH: 27.4 pg (ref 26.0–34.0)
MCHC: 32.1 g/dL (ref 30.0–36.0)
MCV: 85.2 fL (ref 78.0–100.0)
MONO ABS: 0.4 10*3/uL (ref 0.1–1.0)
MONOS PCT: 8 % (ref 3–12)
NEUTROS ABS: 2.1 10*3/uL (ref 1.7–7.7)
Neutrophils Relative %: 44 % (ref 43–77)
PLATELETS: 319 10*3/uL (ref 150–400)
RBC: 4.6 MIL/uL (ref 3.87–5.11)
RDW: 15.8 % — AB (ref 11.5–15.5)
WBC: 4.8 10*3/uL (ref 4.0–10.5)

## 2015-02-19 LAB — URINALYSIS, ROUTINE W REFLEX MICROSCOPIC
BILIRUBIN URINE: NEGATIVE
GLUCOSE, UA: NEGATIVE mg/dL
Hgb urine dipstick: NEGATIVE
Ketones, ur: NEGATIVE mg/dL
Leukocytes, UA: NEGATIVE
NITRITE: NEGATIVE
PH: 6 (ref 5.0–8.0)
Protein, ur: NEGATIVE mg/dL
SPECIFIC GRAVITY, URINE: 1.018 (ref 1.005–1.030)
UROBILINOGEN UA: 0.2 mg/dL (ref 0.0–1.0)

## 2015-02-19 LAB — LIPASE, BLOOD: Lipase: 28 U/L (ref 22–51)

## 2015-02-19 MED ORDER — TRAMADOL HCL 50 MG PO TABS: 50.0000 mg | ORAL_TABLET | Freq: Four times a day (QID) | ORAL | Status: DC | PRN

## 2015-02-19 NOTE — ED Provider Notes (Signed)
CSN: [id number]     Arrival date & time 02/19/15  1446 History   First MD Initiated Contact with Patient 02/19/15 1628     Chief Complaint  Patient presents with  . Back Pain  . Abdominal Pain     (Consider location/radiation/quality/duration/timing/severity/associated sxs/prior Treatment) Patient is a 44 y.o. female presenting with back pain and abdominal pain. The history is provided by the patient.  Back Pain Associated symptoms: abdominal pain   Abdominal Pain She has been having pain in both flanks, radiating to both lower quadrants. This started one week ago, and got worse over the last three days. Pain is dull, and worse when she lays down, much worse at night. She rates pain at 3/10 now, but 10/10 at night. Pain is not affected by movement or lifting. No fever, chills, sweats. She has noticed severe nocturia - getting up every hour. There is some urinary urgency, but no dysuria. Of note, she was treated for strep throat on July 14. She has been taking acetaminophen and ibuprofen for pain, with no relief.  Past Medical History  Diagnosis Date  . Lung collapse   . Pulmonary embolism, blood-clot, obstetric   . DVT (deep venous thrombosis) 12/06/2012   Past Surgical History  Procedure Laterality Date  . Rib resection     Family History  Problem Relation Age of Onset  . COPD Father   . Stroke Father    History  Substance Use Topics  . Smoking status: Never Smoker   . Smokeless tobacco: Never Used  . Alcohol Use: Yes     Comment: socially    OB History    Gravida Para Term Preterm AB TAB SAB Ectopic Multiple Living   1 1 1  0 0 0 0 0 0 1     Review of Systems  Gastrointestinal: Positive for abdominal pain.  Musculoskeletal: Positive for back pain.  All other systems reviewed and are negative.     Allergies  Review of patient's allergies indicates no known allergies.  Home Medications   Prior to Admission medications   Medication Sig Start Date End Date Taking?  Authorizing Provider  acetaminophen (TYLENOL) 325 MG tablet Take 650 mg by mouth daily as needed for mild pain, moderate pain or headache.    Yes Historical Provider, MD  ibuprofen (ADVIL,MOTRIN) 200 MG tablet Take 400 mg by mouth every 6 (six) hours as needed for fever, headache, mild pain or moderate pain.   Yes Historical Provider, MD  warfarin (COUMADIN) 5 MG tablet Take as directed per coumadin clinic Patient taking differently: Take 10-15 mg by mouth daily at 6 PM. Take 2 tablets (10mg ) by mouth daily on MON,THU,FRI,SAT,SUN. Take 3 tablets (15mg ) by mouth daily on [place]. As directed by coumadin clinic. 01/14/15  Yes Boykin Nearing, MD  acetaminophen-codeine 120-12 MG/5ML solution Take 10 mLs by mouth every 4 (four) hours as needed for moderate pain. Patient not taking: Reported on 02/19/2015 01/28/15   Dalia Heading, PA-C  methocarbamol (ROBAXIN) 500 MG tablet Take 2 tablets (1,000 mg total) by mouth every 8 (eight) hours as needed for muscle spasms. Patient not taking: Reported on 02/19/2015 09/16/14   Julianne Rice, MD   BP 98/65 mmHg  Pulse 81  Temp(Src) 97.9 F (36.6 C) (Oral)  SpO2 100%  LMP 01/24/2015 Physical Exam  Nursing note and vitals reviewed.  44 year old female, resting comfortably and in no acute distress. Vital signs are normal. Oxygen saturation is 100%, which is normal. Head  is normocephalic and atraumatic. PERRLA, EOMI. Oropharynx is clear. Neck is nontender and supple without adenopathy or JVD. Back is nontender in the midline. There is bilateral CVA tenderness. Straight leg raise is negative. Lungs are clear without rales, wheezes, or rhonchi. Chest is nontender. Heart has regular rate and rhythm without murmur. Abdomen is soft, flat, with mild tenderness in both lower quadrants. There are no masses or hepatosplenomegaly and peristalsis is normoactive. Extremities have no cyanosis or edema, full range of motion is present. Skin is warm and dry  without rash. Neurologic: Mental status is normal, cranial nerves are intact, there are no motor or sensory deficits.  ED Course  Procedures (including critical care time) Labs Review Results for orders placed or performed during the hospital encounter of 02/19/15  Urinalysis, Routine w reflex microscopic-may I&O cath if menses (not at [facility])  Result Value Ref Range   Color, Urine YELLOW YELLOW   APPearance CLOUDY (A) CLEAR   Specific Gravity, Urine 1.018 1.005 - 1.030   pH 6.0 5.0 - 8.0   Glucose, UA NEGATIVE NEGATIVE mg/dL   Hgb urine dipstick NEGATIVE NEGATIVE   Bilirubin Urine NEGATIVE NEGATIVE   Ketones, ur NEGATIVE NEGATIVE mg/dL   Protein, ur NEGATIVE NEGATIVE mg/dL   Urobilinogen, UA 0.2 0.0 - 1.0 mg/dL   Nitrite NEGATIVE NEGATIVE   Leukocytes, UA NEGATIVE NEGATIVE  CBC with Differential  Result Value Ref Range   WBC 4.8 4.0 - 10.5 K/uL   RBC 4.60 3.87 - 5.11 MIL/uL   Hemoglobin 12.6 12.0 - 15.0 g/dL   HCT 39.2 36.0 - 46.0 %   MCV 85.2 78.0 - 100.0 fL   MCH 27.4 26.0 - 34.0 pg   MCHC 32.1 30.0 - 36.0 g/dL   RDW 15.8 (H) 11.5 - 15.5 %   Platelets 319 150 - 400 K/uL   Neutrophils Relative % 44 43 - 77 %   Neutro Abs 2.1 1.7 - 7.7 K/uL   Lymphocytes Relative 46 12 - 46 %   Lymphs Abs 2.2 0.7 - 4.0 K/uL   Monocytes Relative 8 3 - 12 %   Monocytes Absolute 0.4 0.1 - 1.0 K/uL   Eosinophils Relative 2 0 - 5 %   Eosinophils Absolute 0.1 0.0 - 0.7 K/uL   Basophils Relative 0 0 - 1 %   Basophils Absolute 0.0 0.0 - 0.1 K/uL  Protime-INR  Result Value Ref Range   Prothrombin Time 23.1 (H) 11.6 - 15.2 seconds   INR 2.07 (H) 0.00 - 1.49  Comprehensive metabolic panel  Result Value Ref Range   Sodium 136 135 - 145 mmol/L   Potassium 3.9 3.5 - 5.1 mmol/L   Chloride 106 101 - 111 mmol/L   CO2 24 22 - 32 mmol/L   Glucose, Bld 96 65 - 99 mg/dL   BUN 10 6 - 20 mg/dL   Creatinine, Ser 0.95 0.44 - 1.00 mg/dL   Calcium 8.4 (L) 8.9 - 10.3 mg/dL   Total Protein 7.1 6.5 - 8.1  g/dL   Albumin 3.1 (L) 3.5 - 5.0 g/dL   AST 14 (L) 15 - 41 U/L   ALT 13 (L) 14 - 54 U/L   Alkaline Phosphatase 59 38 - 126 U/L   Total Bilirubin 0.5 0.3 - 1.2 mg/dL   GFR calc non Af Amer >60 >60 mL/min   GFR calc Af Amer >60 >60 mL/min   Anion gap 6 5 - 15  Lipase, blood  Result Value Ref Range   Lipase  28 22 - 51 U/L   Imaging Review Ct Renal Stone Study  02/19/2015   CLINICAL DATA:  Bilateral flank pain for 1 week  EXAM: CT ABDOMEN AND PELVIS WITHOUT CONTRAST  TECHNIQUE: Multidetector CT imaging of the abdomen and pelvis was performed following the standard protocol without IV contrast.  COMPARISON:  None.  FINDINGS: Lung bases are unremarkable. Small hiatal hernia. Sagittal images of the spine shows mild degenerative changes lumbar spine. Unenhanced liver shows no biliary ductal dilatation. No calcified gallstones are noted within gallbladder. Unenhanced pancreas, spleen and adrenal glands are unremarkable. Unenhanced kidneys are symmetrical in size. No nephrolithiasis. No hydronephrosis or hydroureter. No aortic aneurysm.  No small bowel obstruction. No ascites or free air. No adenopathy. There is markedly enlarged uterus with multiple fibroids the uterus measures at least 18 by 9.2 cm. Correlation with GYN exam and pelvic ultrasound is recommended. The largest left fibroid in uterine body distorting the endometrial cavity measures at least 9 cm. No adnexal masses noted. There is a follicle within right ovary measures 2.1 cm.  Normal appendix.  No pericecal inflammation.  No calcified ureteral calculi. No calcified calculi are noted within under distended urinary bladder. Bilateral distal ureter is unremarkable.  IMPRESSION: 1. There is no evidence of nephrolithiasis. No hydronephrosis or hydroureter. 2. There is markedly enlarged uterus with multiple nodular fibroids. Correlation with GYN exam and pelvic ultrasound is recommended. 3. Normal appendix.  No pericecal inflammation. 4. No small bowel  obstruction. 5. Small hiatal hernia.   Electronically Signed   By: Lahoma Crocker M.D.   On: 02/19/2015 17:12    MDM   Final diagnoses:  None    Bilateral flank pain - consider UTI. 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 She is on long-term anticoagulation with warfarin, so INR will be checked. Urinalysis and metabolic panel are obtained.  Workup is negative including normal renal function, normal urinalysis, unremarkable CT of abdomen and pelvis. Patient is advised of the findings and is discharged with prescription for tramadol for pain.  Delora Fuel, MD 22/48/25 0037

## 2015-02-19 NOTE — Discharge Instructions (Signed)
Your tests did not show a reason for your pain. Continue taking acetaminophen as needed for milder pain. Take tramadol for more severe pain. Do not take ibuprofen, or other non-steroidal anti-inflammatory drugs - they can cause bleedign which is worse when you are taking warfarin.  Flank Pain Flank pain refers to pain that is located on the side of the body between the upper abdomen and the back. The pain may occur over a short period of time (acute) or may be long-term or reoccurring (chronic). It may be mild or severe. Flank pain can be caused by many things. CAUSES  Some of the more common causes of flank pain include:  Muscle strains.   Muscle spasms.   A disease of your spine (vertebral disk disease).   A lung infection (pneumonia).   Fluid around your lungs (pulmonary edema).   A kidney infection.   Kidney stones.   A very painful skin rash caused by the chickenpox virus (shingles).   Gallbladder disease.  [place] care will depend on the cause of your pain. In general,  Rest as directed by your caregiver.  Drink enough fluids to keep your urine clear or pale yellow.  Only take over-the-counter or prescription medicines as directed by your caregiver. Some medicines may help relieve the pain.  Tell your caregiver about any changes in your pain.  Follow up with your caregiver as directed. SEEK IMMEDIATE MEDICAL CARE IF:   Your pain is not controlled with medicine.   You have new or worsening symptoms.  Your pain increases.   You have abdominal pain.   You have shortness of breath.   You have persistent nausea or vomiting.   You have swelling in your abdomen.   You feel faint or pass out.   You have blood in your urine.  You have a fever or persistent symptoms for more than 2-3 days.  You have a fever and your symptoms suddenly get worse. MAKE SURE YOU:   Understand these instructions.  Will watch your  condition.  Will get help right away if you are not doing well or get worse. Document Released: 08/24/2005 Document Revised: 03/27/2012 Document Reviewed: 02/15/2012 [facility] Patient Information 2015 [place], [place]. This information is not intended to replace advice given to you by your health care provider. Make sure you discuss any questions you have with your health care provider.  Tramadol tablets What is this medicine? TRAMADOL (TRA ma dole) is a pain reliever. It is used to treat moderate to severe pain in adults. This medicine may be used for other purposes; ask your health care provider or pharmacist if you have questions. COMMON BRAND NAME(S): Ultram What should I tell my health care provider before I take this medicine? They need to know if you have any of these conditions: -brain tumor -depression -drug abuse or addiction -head injury -if you frequently drink alcohol containing drinks -kidney disease or trouble passing urine -liver disease -lung disease, asthma, or breathing problems -seizures or epilepsy -suicidal thoughts, plans, or attempt; a previous suicide attempt by you or a family member -an unusual or allergic reaction to tramadol, codeine, other medicines, foods, dyes, or preservatives -pregnant or trying to get pregnant -breast-feeding How should I use this medicine? Take this medicine by mouth with a full glass of water. Follow the directions on the prescription label. If the medicine upsets your stomach, take it with food or milk. Do not take more medicine than you are told to  take. Talk to your pediatrician regarding the use of this medicine in children. Special care may be needed. Overdosage: If you think you have taken too much of this medicine contact a poison control center or emergency room at once. NOTE: This medicine is only for you. Do not share this medicine with others. What if I miss a dose? If you miss a dose, take it as soon as you can. If it is  almost time for your next dose, take only that dose. Do not take double or extra doses. What may interact with this medicine? Do not take this medicine with any of the following medications: -MAOIs like Carbex, Eldepryl, Marplan, Nardil, and Parnate This medicine may also interact with the following medications: -alcohol or medicines that contain alcohol -antihistamines -benzodiazepines -bupropion -carbamazepine or oxcarbazepine -clozapine -cyclobenzaprine -digoxin -furazolidone -linezolid -medicines for depression, anxiety, or psychotic disturbances -medicines for migraine headache like almotriptan, eletriptan, frovatriptan, naratriptan, rizatriptan, sumatriptan, zolmitriptan -medicines for pain like pentazocine, buprenorphine, butorphanol, meperidine, nalbuphine, and propoxyphene -medicines for sleep -muscle relaxants -naltrexone -phenobarbital -phenothiazines like perphenazine, thioridazine, chlorpromazine, mesoridazine, fluphenazine, prochlorperazine, promazine, and trifluoperazine -procarbazine -warfarin This list may not describe all possible interactions. Give your health care provider a list of all the medicines, herbs, non-prescription drugs, or dietary supplements you use. Also tell them if you smoke, drink alcohol, or use illegal drugs. Some items may interact with your medicine. What should I watch for while using this medicine? Tell your doctor or health care professional if your pain does not go away, if it gets worse, or if you have new or a different type of pain. You may develop tolerance to the medicine. Tolerance means that you will need a higher dose of the medicine for pain relief. Tolerance is normal and is expected if you take this medicine for a long time. Do not suddenly stop taking your medicine because you may develop a severe reaction. Your body becomes used to the medicine. This does NOT mean you are addicted. Addiction is a behavior related to getting and  using a drug for a non-medical reason. If you have pain, you have a medical reason to take pain medicine. Your doctor will tell you how much medicine to take. If your doctor wants you to stop the medicine, the dose will be slowly lowered over time to avoid any side effects. You may get drowsy or dizzy. Do not drive, use machinery, or do anything that needs mental alertness until you know how this medicine affects you. Do not stand or sit up quickly, especially if you are an older patient. This reduces the risk of dizzy or fainting spells. Alcohol can increase or decrease the effects of this medicine. Avoid alcoholic drinks. You may have constipation. Try to have a bowel movement at least every 2 to 3 days. If you do not have a bowel movement for 3 days, call your doctor or health care professional. Your mouth may get dry. Chewing sugarless gum or sucking hard candy, and drinking plenty of water may help. Contact your doctor if the problem does not go away or is severe. What side effects may I notice from receiving this medicine? Side effects that you should report to your doctor or health care professional as soon as possible: -allergic reactions like skin rash, itching or hives, swelling of the face, lips, or tongue -breathing difficulties, wheezing -confusion -itching -light headedness or fainting spells -redness, blistering, peeling or loosening of the skin, including inside the mouth -seizures Side effects  that usually do not require medical attention (report to your doctor or health care professional if they continue or are bothersome): -constipation -dizziness -drowsiness -headache -nausea, vomiting This list may not describe all possible side effects. Call your doctor for medical advice about side effects. You may report side effects to FDA at 1-800-FDA-1088. Where should I keep my medicine? Keep out of the reach of children. Store at room temperature between 15 and 30 degrees C (59 and  86 degrees F). Keep container tightly closed. Throw away any unused medicine after the expiration date. NOTE: This sheet is a summary. It may not cover all possible information. If you have questions about this medicine, talk to your doctor, pharmacist, or health care provider.  2015, [employer]. (2010-03-16 11:55:44)

## 2015-02-19 NOTE — ED Notes (Signed)
Patient states she has bilateral low back pain and lower abdominal pain x 1 week. Patient is having urinary frequency. Patient denies any vaginal discharge.

## 2015-02-19 NOTE — ED Notes (Signed)
Pt c/o bilateral flank pain radiating to lower abdomen x 1wk.  Pt also c/o urinary frequency.  Pt denies dysuria, N/V.  Pt had normal bowel movement today.

## 2015-02-23 ENCOUNTER — Ambulatory Visit: Payer: Medicaid Other | Attending: Family Medicine | Admitting: Pharmacist

## 2015-02-23 LAB — POCT INR: INR: 3

## 2015-03-02 ENCOUNTER — Ambulatory Visit: Payer: Medicaid Other | Attending: Family Medicine | Admitting: Pharmacist

## 2015-03-02 DIAGNOSIS — I2699 Other pulmonary embolism without acute cor pulmonale: Secondary | ICD-10-CM

## 2015-03-02 LAB — POCT INR: INR: 2.6

## 2015-03-02 MED ORDER — WARFARIN SODIUM 5 MG PO TABS: 10.0000 mg | ORAL_TABLET | Freq: Every day | ORAL | Status: DC

## 2015-03-02 NOTE — Addendum Note (Signed)
Addended by: Nicoletta Ba A on: 03/02/2015 12:46 PM   Modules accepted: Orders

## 2015-03-16 ENCOUNTER — Ambulatory Visit: Payer: Self-pay | Attending: Family Medicine | Admitting: Pharmacist

## 2015-03-16 LAB — POCT INR: INR: 3.9

## 2015-03-23 ENCOUNTER — Ambulatory Visit: Payer: Medicaid Other | Attending: Family Medicine | Admitting: Pharmacist

## 2015-03-23 LAB — POCT INR: INR: 3.7

## 2015-03-30 ENCOUNTER — Ambulatory Visit: Payer: Medicaid Other | Attending: Family Medicine | Admitting: Pharmacist

## 2015-03-30 LAB — POCT INR: INR: 2

## 2015-04-06 ENCOUNTER — Ambulatory Visit: Payer: Medicaid Other | Attending: Family Medicine | Admitting: Pharmacist

## 2015-04-06 LAB — POCT INR: INR: 3.3

## 2015-04-13 ENCOUNTER — Ambulatory Visit: Payer: Medicaid Other | Attending: Family Medicine | Admitting: Pharmacist

## 2015-04-13 LAB — POCT INR: INR: 3

## 2015-04-20 ENCOUNTER — Ambulatory Visit: Payer: Medicaid Other | Attending: Internal Medicine | Admitting: Pharmacist

## 2015-04-20 LAB — POCT INR: INR: 3.3

## 2015-04-27 ENCOUNTER — Ambulatory Visit: Payer: Medicaid Other | Attending: Family Medicine | Admitting: Pharmacist

## 2015-04-27 LAB — POCT INR: INR: 2.7

## 2015-05-11 ENCOUNTER — Ambulatory Visit: Payer: Medicaid Other | Attending: Family Medicine | Admitting: Pharmacist

## 2015-05-11 LAB — POCT INR: INR: 2.4

## 2015-06-01 ENCOUNTER — Ambulatory Visit: Payer: Medicaid Other | Attending: Family Medicine | Admitting: Pharmacist

## 2015-06-01 LAB — POCT INR: INR: 1.5

## 2015-06-08 ENCOUNTER — Ambulatory Visit: Payer: Medicaid Other | Attending: Family Medicine | Admitting: Pharmacist

## 2015-06-08 LAB — POCT INR: INR: 3.4

## 2015-06-15 ENCOUNTER — Ambulatory Visit: Payer: Medicaid Other | Attending: Family Medicine | Admitting: Pharmacist

## 2015-06-15 LAB — POCT INR: INR: 2.6

## 2015-06-29 ENCOUNTER — Ambulatory Visit: Payer: Medicaid Other | Attending: Family Medicine | Admitting: Pharmacist

## 2015-06-29 DIAGNOSIS — I2699 Other pulmonary embolism without acute cor pulmonale: Secondary | ICD-10-CM

## 2015-06-29 LAB — POCT INR: INR: 2

## 2015-06-29 MED ORDER — WARFARIN SODIUM 5 MG PO TABS: 10.0000 mg | ORAL_TABLET | Freq: Every day | ORAL | Status: DC

## 2015-07-06 ENCOUNTER — Telehealth: Payer: Self-pay | Admitting: Hematology

## 2015-07-06 NOTE — Telephone Encounter (Signed)
DUE TO CALL MOVED 1/10 F/U TO 1/9. SPOKE WITH PATIENT RE CHANGE AND ALSO CONFIRMED 1/5 LAB.

## 2015-07-16 NOTE — Telephone Encounter (Signed)
I did not provide this service but the provider of this encounter is no longer at this site. I am closing the encounter.

## 2015-07-16 NOTE — Telephone Encounter (Signed)
Error

## 2015-07-16 NOTE — Progress Notes (Signed)
I did not provide this service but I am responsible for [facility] now and the provider of this encounter is no longer at this site. I am closing the encounter.

## 2015-07-20 ENCOUNTER — Ambulatory Visit: Payer: Medicaid Other | Attending: Family Medicine | Admitting: Pharmacist

## 2015-07-20 LAB — POCT INR: INR: 1.8

## 2015-07-22 ENCOUNTER — Other Ambulatory Visit (HOSPITAL_BASED_OUTPATIENT_CLINIC_OR_DEPARTMENT_OTHER): Payer: Self-pay

## 2015-07-22 DIAGNOSIS — D500 Iron deficiency anemia secondary to blood loss (chronic): Secondary | ICD-10-CM

## 2015-07-22 LAB — CBC & DIFF AND RETIC
BASO%: 1.7 % (ref 0.0–2.0)
Basophils Absolute: 0.1 10*3/uL (ref 0.0–0.1)
EOS ABS: 0.2 10*3/uL (ref 0.0–0.5)
EOS%: 2.7 % (ref 0.0–7.0)
HCT: 33.3 % — ABNORMAL LOW (ref 34.8–46.6)
HEMOGLOBIN: 9.7 g/dL — AB (ref 11.6–15.9)
Immature Retic Fract: 23.7 % — ABNORMAL HIGH (ref 1.60–10.00)
LYMPH%: 39.2 % (ref 14.0–49.7)
MCH: 19.1 pg — ABNORMAL LOW (ref 25.1–34.0)
MCHC: 29.1 g/dL — ABNORMAL LOW (ref 31.5–36.0)
MCV: 65.7 fL — AB (ref 79.5–101.0)
MONO#: 0.8 10*3/uL (ref 0.1–0.9)
MONO%: 13.2 % (ref 0.0–14.0)
NEUT#: 2.5 10*3/uL (ref 1.5–6.5)
NEUT%: 43.2 % (ref 38.4–76.8)
PLATELETS: 264 10*3/uL (ref 145–400)
RBC: 5.07 10*6/uL (ref 3.70–5.45)
RDW: 19.2 % — ABNORMAL HIGH (ref 11.2–14.5)
Retic %: 2.01 % (ref 0.70–2.10)
Retic Ct Abs: 101.91 10*3/uL — ABNORMAL HIGH (ref 33.70–90.70)
WBC: 5.7 10*3/uL (ref 3.9–10.3)
lymph#: 2.2 10*3/uL (ref 0.9–3.3)

## 2015-07-22 LAB — COMPREHENSIVE METABOLIC PANEL
ALT: 9 U/L (ref 0–55)
AST: 12 U/L (ref 5–34)
Albumin: 3 g/dL — ABNORMAL LOW (ref 3.5–5.0)
Alkaline Phosphatase: 62 U/L (ref 40–150)
Anion Gap: 7 mEq/L (ref 3–11)
BUN: 8.2 mg/dL (ref 7.0–26.0)
CHLORIDE: 109 meq/L (ref 98–109)
CO2: 21 meq/L — AB (ref 22–29)
CREATININE: 0.9 mg/dL (ref 0.6–1.1)
Calcium: 8.2 mg/dL — ABNORMAL LOW (ref 8.4–10.4)
EGFR: >90 mL/min/{1.73_m2} (ref >90)
GLUCOSE: 93 mg/dL (ref 70–140)
Potassium: 3.8 mEq/L (ref 3.5–5.1)
SODIUM: 136 meq/L (ref 136–145)
TOTAL PROTEIN: 7 g/dL (ref 6.4–8.3)

## 2015-07-22 LAB — FERRITIN: Ferritin: 7 ng/ml — ABNORMAL LOW (ref 9–269)

## 2015-07-22 LAB — IRON AND TIBC
%SAT: 4 % — AB (ref 21–57)
Iron: 17 ug/dL — ABNORMAL LOW (ref 41–142)
TIBC: 388 ug/dL (ref 236–444)
UIBC: 371 ug/dL (ref 120–384)

## 2015-07-26 ENCOUNTER — Ambulatory Visit (HOSPITAL_BASED_OUTPATIENT_CLINIC_OR_DEPARTMENT_OTHER): Payer: Self-pay | Admitting: Hematology

## 2015-07-26 ENCOUNTER — Encounter: Payer: Self-pay | Admitting: Hematology

## 2015-07-26 ENCOUNTER — Telehealth: Payer: Self-pay | Admitting: Hematology

## 2015-07-26 DIAGNOSIS — D509 Iron deficiency anemia, unspecified: Secondary | ICD-10-CM

## 2015-07-26 DIAGNOSIS — Z86718 Personal history of other venous thrombosis and embolism: Secondary | ICD-10-CM

## 2015-07-26 NOTE — Telephone Encounter (Signed)
Talked to patient here in office. Scheduled appt.       [facility]. °

## 2015-07-26 NOTE — Progress Notes (Signed)
[place] [phone]   [phone]  Follow up note    Name: Donna [name] Date: 07/26/2015 MRN: [id number] DOB: 08-Jun-1971  PCP: Minerva Ends, MD    CHIEF COMPLAIN: follow up recurrent DVT/PE, iron deficient anemia   HISTORY OF PRESENT ILLNESS:Donna [name] is a 45 y.o. 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  Her DVT was thought to have been provoked by a leg spriain that happened a few days before her developing symptoms.  Donna [name] had a hypercoagulable workup which was positive for lupus anticoagulant and decreased protein C levels.  Donna [name] was discharge from the hospital on 5/28, and today Donna [name] is inquiring whether Donna [name] can come off anticoagulation.  Donna [name] denies any prior DVTs or blood clots or family history of blood clots in the past.   Donna [name] denies any melena or hematochezia or bleeding complications while on coumadin.  Donna [name] has one child without a history of having miscarriages.  Due to recent subtherapeutic INR levels, Donna [name] was started on 10 mg of coumadin on 06/03/2013.  Her INR on 05/28/2013 was 1.4 and on 06/02/2013 was 1.5.   Donna [name] had repeat hypercoagulable workup on 06/05/2013 which revealed lupus anticoagulant was not detected.  Donna [name] was negative for factor V mutation and prothrombin II gene mutation.  Her anticardiolipin were alo negative.  Antithrombin III levels were in normal range.      CURRENT THERAPY: Coumadin   INTERIM HISTORY: Donna [name] returns for follow up. Donna [name] has been compliant with coumadin but has not taken oral iron pill lately. Her menstrual period is still pretty heavily for the first 2 days, but overall better than before. Donna [name] denies any other bleeding. Donna [name] has moderate fatigue, no dyspnea or chest pain, tolerating routine activity without difficulties.  PAST MEDICAL HISTORY:  has a past medical history of Lung collapse; Pulmonary embolism, blood-clot, obstetric; and DVT (deep venous thrombosis) ([place])  (12/06/2012).     PAST SURGICAL HISTORY: Past Surgical History  Procedure Laterality Date  . Rib resection     CURRENT MEDICATIONS Current Outpatient Prescriptions on File Prior to Visit  Medication Sig Dispense Refill  . acetaminophen (TYLENOL) 325 MG tablet Take 650 mg by mouth daily as needed for mild pain, moderate pain or headache.     . traMADol (ULTRAM) 50 MG tablet Take 1 tablet (50 mg total) by mouth every 6 (six) hours as needed. 15 tablet 0  . warfarin (COUMADIN) 5 MG tablet Take 2 tablets (10 mg total) by mouth daily at 6 PM. Take 2 tablets (10mg ) by mouth daily. As directed by coumadin clinic. 90 tablet 3   No current facility-administered medications on file prior to visit.  ;  ALLERGIES: Review of patient's allergies indicates no known allergies.   SOCIAL HISTORY:  reports that Donna [name] has never smoked. Donna [name] has never used smokeless tobacco. Donna [name] reports that Donna [name] drinks alcohol. Donna [name] reports that Donna [name] does not use illicit drugs.   FAMILY HISTORY: family history includes COPD in her father; Stroke in her father.   LABORATORY DATA:  No results found for this or any previous visit (from the past 48 hour(s)).   CBC CBC Latest Ref Rng 07/22/2015 02/19/2015 01/28/2015  WBC 3.9 - 10.3 10e3/uL 5.7 4.8 17.6(H)  Hemoglobin 11.6 - 15.9 g/dL 9.7(L) 12.6 13.6  Hematocrit 34.8 - 46.6 % 33.3(L) 39.2 41.0  Platelets 145 - 400 10e3/uL 264 319 308    CMP Latest Ref  Rng 07/22/2015 02/19/2015 01/28/2015  Glucose 70 - 140 mg/dl 93 96 106(H)  BUN 7.0 - 26.0 mg/dL 8.2 10 9   Creatinine 0.6 - 1.1 mg/dL 0.9 0.95 1.07(H)  Sodium 136 - 145 mEq/L 136 136 133(L)  Potassium 3.5 - 5.1 mEq/L 3.8 3.9 3.5  Chloride 101 - 111 mmol/L - 106 103  CO2 22 - 29 mEq/L 21(L) 24 22  Calcium 8.4 - 10.4 mg/dL 8.2(L) 8.4(L) 8.6(L)  Total Protein 6.4 - 8.3 g/dL 7.0 7.1 -  Total Bilirubin 0.20 - 1.20 mg/dL <0.30 0.5 -  Alkaline Phos 40 - 150 U/L 62 59 -  AST 5 - 34 U/L 12 14(L) -  ALT 0 - 55 U/L 9 13(L) -   Results  for Donna, [name] (MRN [id number]) as of 07/26/2015 10:36  Ref. Range 07/22/2015 14:12  Iron Latest Ref Range: 41-142 ug/dL 17 (L)  UIBC Latest Ref Range: 120-384 ug/dL 371  TIBC Latest Ref Range: 236-444 ug/dL 388  %SAT Latest Ref Range: 21-57 % 4 (L)  Ferritin Latest Ref Range: 9-269 ng/ml 7 (L)    REVIEW OF SYSTEMS:  Constitutional: Denies fevers, chills or abnormal weight loss Eyes: Denies blurriness of vision Ears, nose, mouth, throat, and face: Denies mucositis or sore throat Respiratory: Denies cough, dyspnea or wheezes Cardiovascular: Denies palpitation, chest discomfort or lower extremity swelling Gastrointestinal:  Denies nausea, heartburn or change in bowel habits Skin: Denies abnormal skin rashes Lymphatics: Denies new lymphadenopathy or easy bruising Neurological:Denies numbness, tingling or new weaknesses Behavioral/Psych: Mood is stable, no new changes  All other systems were reviewed with the patient and are negative.  PHYSICAL EXAM:  Pressure 110/71, heart rate 86, respiratory rate 18, temperature 90.8, pulse ox 100%, weight 279.3 pounds GENERAL:alert, no distress and comfortable; Moderately obese.  SKIN: skin color, texture, turgor are normal, no rashes or significant lesions EYES: normal, Conjunctiva are pink and non-injected, sclera clear OROPHARYNX:no exudate, no erythema and lips, buccal mucosa, and tongue normal  NECK: supple, thyroid normal size, non-tender, without nodularity LYMPH:  no palpable lymphadenopathy in the cervical, axillary or inguinal LUNGS: clear to auscultation and percussion with normal breathing effort HEART: regular rate & rhythm and no murmurs and no lower extremity edema ABDOMEN:abdomen soft, non-tender and normal bowel sounds Musculoskeletal:no cyanosis of digits and no clubbing  NEURO: alert & oriented x 3 with fluent speech, no focal motor/sensory deficits  Radiology  No new scans    IMPRESSION: Donna [name] is a 45 y.o.  female with a history of    PLAN:  1.  Recurrent DVT/PE.  --Donna [name] had provoked DVT and PE in 2014, and second episode off on provoked TE in December 2015 after Donna [name] came off Coumadin. --Her PTT Lupus anticoagulant was 89.1 on 12/05/2012 and decreased Protein C levels.  Her repeated lupus anticoagulant is negative without detection of a lupus anticoagulant. Hypercoagulopathy workup was negative when Donna [name] was off Coumadin. -I recommend anticoagulation for life long if there is no contraindication such as bleeding. -We discussed Different options of anticoagulation. Donna [name] could not tolerate Xarelto. Donna [name] had some skin thickening in the neck, questionable skin necrosis from coumadin, resolved now  -Preventive strategy such as avoid cigarette smoking, contraceptives, and aware compression stocks on [employer]. Donna [name] Voiced Good Understanding. -Donna [name] is tolerating Coumadin well, we'll continue and follow up in Coumadin clinic.   2. Anemia of iron deficiency, likely secondary to menorrhagia -Donna [name] had fairly low ferritin and iron level, all consistent with iron deficiency. -Donna [name] had  good response to oral iron supplement, anemia significantly improved. Hb 13.2 tiday. Ferritin level also went up -Donna [name] developed anemia and iron deficiency after Donna [name] stopped oral iron, hemoglobin 9.7 last week, ferritin 7. I discussed the option of resuming oral iron, or IV Feraheme. Donna [name] opted to resume oral iron pill 3 times a day.   3. Obesity. -- Continued dieting and exercises per her PCP.     Follow-up. -Donna [name] will resume oral iron pill -Repeat lab in one month and one-week before next visit -I'll see her back in 3 months     Truitt Merle, MD 07/26/2015                      [place] [phone]) [phone]   [phone]) [phone]  Follow up note    Name: Yadhira Heldreth Price Date: 07/26/2015 MRN: [id number] DOB: Dec 06, 1970  PCP: Minerva Ends, MD   REFERRING PHYSICIAN: Boykin Nearing, MD  REASON FOR REFERRAL: recurrent DVT/PE, iron deficient anemia    HISTORY OF PRESENT ILLNESS:Donna [name] is a 45 y.o. 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  Her DVT was thought to have been provoked by a leg spriain that happened a few days before her developing symptoms.  Donna [name] had a hypercoagulable workup which was positive for lupus anticoagulant and decreased protein C levels.  Donna [name] was discharge from the hospital on 5/28, and today Donna [name] is inquiring whether Donna [name] can come off anticoagulation.  Donna [name] denies any prior DVTs or blood clots or family history of blood clots in the past.   Donna [name] denies any melena or hematochezia or bleeding complications while on coumadin.  Donna [name] has one child without a history of having miscarriages.  Due to recent subtherapeutic INR levels, Donna [name] was started on 10 mg of coumadin on 06/03/2013.  Her INR on 05/28/2013 was 1.4 and on 06/02/2013 was 1.5.   Donna [name] had repeat hypercoagulable workup on 06/05/2013 which revealed lupus anticoagulant was not detected.  Donna [name] was negative for factor V mutation and prothrombin II gene mutation.  Her anticardiolipin were alo negative.  Antithrombin III levels were in normal range.      INTERIM HISTORY: Donna [name] returns for follow up. Donna [name] has been compliant with coumadin and po iron pill (with orange juice) 3 times daily. Donna [name] is tolerating both well. No significant constipation. Donna [name] still has heavy period, especially on coumadin, it lasts 3-4 days, Donna [name] needs to change pad every 1-2 hour for the first 2 days, especially when Donna [name] is on coumadin, no other bleedings.    Donna [name] still has intermittent mild left chest pain, which was evaluated at ED before and felt to be muscular related. Donna [name] takes methocarbamol as needed which helps. No other new complains.  PAST MEDICAL HISTORY:  has a past medical history of Lung collapse; Pulmonary embolism, blood-clot, obstetric; and DVT (deep venous thrombosis) ([place]) (12/06/2012).      PAST SURGICAL HISTORY: Past Surgical History  Procedure Laterality Date  . Rib resection     CURRENT MEDICATIONS Current Outpatient Prescriptions on File Prior to Visit  Medication Sig Dispense Refill  . acetaminophen (TYLENOL) 325 MG tablet Take 650 mg by mouth daily as needed for mild pain, moderate pain or headache.     . warfarin (COUMADIN) 5 MG tablet Take 2 tablets (10 mg total) by mouth daily at 6 PM. Take 2 tablets (10mg ) by mouth daily. As directed by coumadin clinic. 90 tablet 3  No current facility-administered medications on file prior to visit.  ;  ALLERGIES: Review of patient's allergies indicates no known allergies.   SOCIAL HISTORY:  reports that Donna [name] has never smoked. Donna [name] has never used smokeless tobacco. Donna [name] reports that Donna [name] drinks alcohol. Donna [name] reports that Donna [name] does not use illicit drugs.   FAMILY HISTORY: family history includes COPD in her father; Stroke in her father.   LABORATORY DATA:  No results found for this or any previous visit (from the past 48 hour(s)).   CBC CBC Latest Ref Rng 07/22/2015 02/19/2015 01/28/2015  WBC 3.9 - 10.3 10e3/uL 5.7 4.8 17.6(H)  Hemoglobin 11.6 - 15.9 g/dL 9.7(L) 12.6 13.6  Hematocrit 34.8 - 46.6 % 33.3(L) 39.2 41.0  Platelets 145 - 400 10e3/uL 264 319 308    CMP Latest Ref Rng 07/22/2015 02/19/2015 01/28/2015  Glucose 70 - 140 mg/dl 93 96 106(H)  BUN 7.0 - 26.0 mg/dL 8.2 10 9   Creatinine 0.6 - 1.1 mg/dL 0.9 0.95 1.07(H)  Sodium 136 - 145 mEq/L 136 136 133(L)  Potassium 3.5 - 5.1 mEq/L 3.8 3.9 3.5  Chloride 101 - 111 mmol/L - 106 103  CO2 22 - 29 mEq/L 21(L) 24 22  Calcium 8.4 - 10.4 mg/dL 8.2(L) 8.4(L) 8.6(L)  Total Protein 6.4 - 8.3 g/dL 7.0 7.1 -  Total Bilirubin 0.20 - 1.20 mg/dL <0.30 0.5 -  Alkaline Phos 40 - 150 U/L 62 59 -  AST 5 - 34 U/L 12 14(L) -  ALT 0 - 55 U/L 9 13(L) -   Ferritin  Status: Finalresult Visible to patient:  Not Released Nextappt: 01/19/2015 at 03:30 PM in Oncology [facility]  1) Dx:  Iron deficiency anemia due to chronic...           Ref Range 6d ago  39mo ago  16yr ago     Ferritin 9 - 269 ng/ml 14 8 (L)R, CM 13R           REVIEW OF SYSTEMS:  Constitutional: Denies fevers, chills or abnormal weight loss Eyes: Denies blurriness of vision Ears, nose, mouth, throat, and face: Denies mucositis or sore throat Respiratory: Denies cough, dyspnea or wheezes Cardiovascular: Denies palpitation, chest discomfort or lower extremity swelling Gastrointestinal:  Denies nausea, heartburn or change in bowel habits Skin: Denies abnormal skin rashes Lymphatics: Denies new lymphadenopathy or easy bruising Neurological:Denies numbness, tingling or new weaknesses Behavioral/Psych: Mood is stable, no new changes  All other systems were reviewed with the patient and are negative.  PHYSICAL EXAM:  vitals were not taken for this visit.  GENERAL:alert, no distress and comfortable; Moderately obese.  SKIN: skin color, texture, turgor are normal, no rashes or significant lesions EYES: normal, Conjunctiva are pink and non-injected, sclera clear OROPHARYNX:no exudate, no erythema and lips, buccal mucosa, and tongue normal  NECK: supple, thyroid normal size, non-tender, without nodularity LYMPH:  no palpable lymphadenopathy in the cervical, axillary or inguinal LUNGS: clear to auscultation and percussion with normal breathing effort HEART: regular rate & rhythm and no murmurs and no lower extremity edema ABDOMEN:abdomen soft, non-tender and normal bowel sounds Musculoskeletal:no cyanosis of digits and no clubbing  NEURO: alert & oriented x 3 with fluent speech, no focal motor/sensory deficits  Radiology  No new scans    IMPRESSION: LINELLE HAYNIE is a 45 y.o. female with a history of    PLAN:  1.  Recurrent DVT/PE.  --Donna [name] had provoked DVT and PE in 2014, and second episode off on provoked TE in December  2015 after Donna [name] was off Coumadin. --Her PTT Lupus  anticoagulant was 89.1 on 12/05/2012 and decreased Protein C levels.  Her repeated lupus anticoagulant is negative without detection of a lupus anticoagulant. Hypercoagulopathy workup was negative when Donna [name] was off Coumadin. -I recommend anticoagulation for life long if there is no contraindication such as bleeding. -We discussed Different options of anticoagulation. Donna [name] could not tolerate Xarelto. Donna [name] had some skin thickening in the neck, questionable skin necrosis from coumadin, resolved now  -Preventive strategy such as avoid cigarette smoking, contraceptives, and aware compression stocks on [employer]. Donna [name] Voiced Good Understanding. -Donna [name] is tolerating Coumadin well, we'll continue and follow up in Coumadin clinic.   2. Anemia of iron deficiency, likely secondary to menorrhagia -Donna [name] has fairly low ferritin and iron level, all consistent with iron deficiency. -Donna [name] had good response to oral iron supplement, anemia significantly improved. Hb 13.2 tiday. Ferritin level trended up, 14 last week.  -continue oral iron 3 tablets daily, Donna [name] is tolerating it well   3. Obesity. -- Continued dieting and exercises per her PCP.   4. Episode of chest pain on 09/17/2014 -likely muscular in nature -Donna [name] will follow-up with her primary care physician   Follow-up. -Return in 6 months with repeated CBC and iron study the week before     Truitt Merle, MD 07/26/2015   2:25 PM

## 2015-07-27 ENCOUNTER — Ambulatory Visit: Payer: Medicaid Other | Admitting: Hematology

## 2015-07-27 ENCOUNTER — Ambulatory Visit: Payer: Medicaid Other | Attending: Family Medicine | Admitting: Pharmacist

## 2015-07-27 LAB — POCT INR: INR: 2.2

## 2015-08-01 DIAGNOSIS — Z86718 Personal history of other venous thrombosis and embolism: Secondary | ICD-10-CM | POA: Insufficient documentation

## 2015-08-03 ENCOUNTER — Encounter: Payer: Self-pay | Admitting: Hematology

## 2015-08-03 NOTE — Progress Notes (Signed)
No active treatment.  

## 2015-08-10 ENCOUNTER — Ambulatory Visit: Payer: Medicaid Other | Attending: Family Medicine | Admitting: Pharmacist

## 2015-08-10 LAB — POCT INR: INR: 3.1

## 2015-08-27 MED FILL — ?WARFARIN SODIUM 5 MG TABLE: 5 | 28 days supply | Qty: 90 | Fill #3

## 2015-08-30 ENCOUNTER — Other Ambulatory Visit (HOSPITAL_BASED_OUTPATIENT_CLINIC_OR_DEPARTMENT_OTHER): Payer: Self-pay

## 2015-08-30 DIAGNOSIS — D509 Iron deficiency anemia, unspecified: Secondary | ICD-10-CM

## 2015-08-30 DIAGNOSIS — D500 Iron deficiency anemia secondary to blood loss (chronic): Secondary | ICD-10-CM

## 2015-08-30 LAB — CBC & DIFF AND RETIC
BASO%: 0.9 % (ref 0.0–2.0)
Basophils Absolute: 0.1 10*3/uL (ref 0.0–0.1)
EOS%: 2.5 % (ref 0.0–7.0)
Eosinophils Absolute: 0.1 10*3/uL (ref 0.0–0.5)
HCT: 35.5 % (ref 34.8–46.6)
HGB: 10.9 g/dL — ABNORMAL LOW (ref 11.6–15.9)
IMMATURE RETIC FRACT: 20.5 % — AB (ref 1.60–10.00)
LYMPH#: 2.4 10*3/uL (ref 0.9–3.3)
LYMPH%: 43.1 % (ref 14.0–49.7)
MCH: 21.2 pg — ABNORMAL LOW (ref 25.1–34.0)
MCHC: 30.7 g/dL — AB (ref 31.5–36.0)
MCV: 69.1 fL — ABNORMAL LOW (ref 79.5–101.0)
MONO#: 0.6 10*3/uL (ref 0.1–0.9)
MONO%: 11.5 % (ref 0.0–14.0)
NEUT#: 2.3 10*3/uL (ref 1.5–6.5)
NEUT%: 42 % (ref 38.4–76.8)
Platelets: 340 10*3/uL (ref 145–400)
RBC: 5.14 10*6/uL (ref 3.70–5.45)
RDW: 22.6 % — AB (ref 11.2–14.5)
RETIC %: 1.26 % (ref 0.70–2.10)
RETIC CT ABS: 64.76 10*3/uL (ref 33.70–90.70)
WBC: 5.6 10*3/uL (ref 3.9–10.3)

## 2015-08-31 LAB — FERRITIN: Ferritin: 5 ng/ml — ABNORMAL LOW (ref 9–269)

## 2015-08-31 LAB — IRON AND TIBC
%SAT: 6 % — AB (ref 21–57)
Iron: 21 ug/dL — ABNORMAL LOW (ref 41–142)
TIBC: 364 ug/dL (ref 236–444)
UIBC: 343 ug/dL (ref 120–384)

## 2015-09-03 ENCOUNTER — Telehealth: Payer: Self-pay | Admitting: *Deleted

## 2015-09-03 ENCOUNTER — Other Ambulatory Visit: Payer: Self-pay | Admitting: Hematology

## 2015-09-03 NOTE — Telephone Encounter (Signed)
Patient left message.  She had blood work this past Monday and would like to know the results and if she needs IV iron.  Her call back number is [phone].

## 2015-09-03 NOTE — Telephone Encounter (Signed)
I called her back, no answer and could not leave a message. I sent a urgent POF to see her with feraheme in one week.   [name]  09/03/2015

## 2015-09-05 ENCOUNTER — Other Ambulatory Visit: Payer: Self-pay | Admitting: Hematology

## 2015-09-06 ENCOUNTER — Telehealth: Payer: Self-pay | Admitting: Hematology

## 2015-09-06 NOTE — Telephone Encounter (Signed)
Spoke with pt to confirm appt d/t for iv iron 2/24 per MD 2/17 pof

## 2015-09-07 ENCOUNTER — Ambulatory Visit: Payer: Medicaid Other | Attending: Family Medicine | Admitting: Pharmacist

## 2015-09-07 LAB — POCT INR: INR: 3

## 2015-09-07 NOTE — Progress Notes (Signed)
Appts have been scheduled.  

## 2015-09-09 ENCOUNTER — Other Ambulatory Visit: Payer: Self-pay | Admitting: Hematology

## 2015-09-09 ENCOUNTER — Telehealth: Payer: Self-pay | Admitting: Hematology

## 2015-09-09 NOTE — Telephone Encounter (Signed)
Ov appt made per 2/23 pof. MD will see pt in infusion

## 2015-09-10 ENCOUNTER — Telehealth: Payer: Self-pay | Admitting: *Deleted

## 2015-09-10 ENCOUNTER — Ambulatory Visit (HOSPITAL_BASED_OUTPATIENT_CLINIC_OR_DEPARTMENT_OTHER): Payer: Self-pay | Admitting: Hematology

## 2015-09-10 ENCOUNTER — Ambulatory Visit (HOSPITAL_BASED_OUTPATIENT_CLINIC_OR_DEPARTMENT_OTHER): Payer: Self-pay

## 2015-09-10 ENCOUNTER — Other Ambulatory Visit: Payer: Self-pay | Admitting: *Deleted

## 2015-09-10 ENCOUNTER — Encounter: Payer: Self-pay | Admitting: Hematology

## 2015-09-10 VITALS — BP 105/56 | HR 86 | Temp 98.9°F | Resp 18

## 2015-09-10 DIAGNOSIS — I2699 Other pulmonary embolism without acute cor pulmonale: Secondary | ICD-10-CM

## 2015-09-10 DIAGNOSIS — D509 Iron deficiency anemia, unspecified: Secondary | ICD-10-CM

## 2015-09-10 DIAGNOSIS — E669 Obesity, unspecified: Secondary | ICD-10-CM

## 2015-09-10 DIAGNOSIS — N920 Excessive and frequent menstruation with regular cycle: Secondary | ICD-10-CM

## 2015-09-10 MED ORDER — FERROUS SULFATE 325 (65 FE) MG PO TABS: 325.0000 mg | ORAL_TABLET | Freq: Two times a day (BID) | ORAL | Status: DC

## 2015-09-10 MED ORDER — FERUMOXYTOL INJECTION 510 MG/17 ML
510.0000 mg | Freq: Once | INTRAVENOUS | Status: AC
  Administered 2015-09-10: 510 mg via INTRAVENOUS
  Filled 2015-09-10: qty 17

## 2015-09-10 MED ORDER — SODIUM CHLORIDE 0.9 % IV SOLN
Freq: Once | INTRAVENOUS | Status: AC
  Administered 2015-09-10: 15:00:00 via INTRAVENOUS

## 2015-09-10 NOTE — Patient Instructions (Signed)

## 2015-09-10 NOTE — Progress Notes (Signed)
[place] Telephone:(336) [phone]   [phone]) [phone]  Follow up note    Name: Donna [name] Date: 09/10/2015 MRN: [id number] DOB: 11/07/1970  PCP: Minerva Ends, MD    CHIEF COMPLAIN: follow up recurrent DVT/PE, iron deficient anemia   HISTORY OF PRESENT ILLNESS:Donna [name] is a 45 y.o. 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  Her DVT was thought to have been provoked by a leg spriain that happened a few days before her developing symptoms.  She had a hypercoagulable workup which was positive for lupus anticoagulant and decreased protein C levels.  She was discharge from the hospital on 5/28, and today she is inquiring whether she can come off anticoagulation.  She denies any prior DVTs or blood clots or family history of blood clots in the past.   She denies any melena or hematochezia or bleeding complications while on coumadin.  She has one child without a history of having miscarriages.  Due to recent subtherapeutic INR levels, she was started on 10 mg of coumadin on 06/03/2013.  Her INR on 05/28/2013 was 1.4 and on 06/02/2013 was 1.5.   She had repeat hypercoagulable workup on 06/05/2013 which revealed lupus anticoagulant was not detected.  She was negative for factor V mutation and prothrombin II gene mutation.  Her anticardiolipin were alo negative.  Antithrombin III levels were in normal range.      CURRENT THERAPY: Coumadin, oral iron pills, IV feraheme as needed  INTERIM HISTORY: [name] returns for follow up. She has been more fatigued lately. Her menstrual period is still very heavy, she changes pad every hour for the first 2 days, no other signs of bleeding. She was noticed to have worsening anemia and iron deficiency on lab, and I recommend her to have IV feraheme. She is not very complaint with oral iron.   PAST MEDICAL HISTORY:  has a past medical history of Lung collapse; Pulmonary embolism, blood-clot, obstetric;  and DVT (deep venous thrombosis) ([place]) (12/06/2012).     PAST SURGICAL HISTORY: Past Surgical History  Procedure Laterality Date  . Rib resection     CURRENT MEDICATIONS Current Outpatient Prescriptions on File Prior to Visit  Medication Sig Dispense Refill  . acetaminophen (TYLENOL) 325 MG tablet Take 650 mg by mouth daily as needed for mild pain, moderate pain or headache.     . warfarin (COUMADIN) 5 MG tablet Take 2 tablets (10 mg total) by mouth daily at 6 PM. Take 2 tablets (10mg ) by mouth daily. As directed by coumadin clinic. 90 tablet 3   No current facility-administered medications on file prior to visit.  ;  ALLERGIES: Review of patient's allergies indicates no known allergies.   SOCIAL HISTORY:  reports that she has never smoked. She has never used smokeless tobacco. She reports that she drinks alcohol. She reports that she does not use illicit drugs.   FAMILY HISTORY: family history includes COPD in her father; Stroke in her father.   LABORATORY DATA:  No results found for this or any previous visit (from the past 48 hour(s)).   CBC CBC Latest Ref Rng 08/30/2015 07/22/2015 02/19/2015  WBC 3.9 - 10.3 10e3/uL 5.6 5.7 4.8  Hemoglobin 11.6 - 15.9 g/dL 10.9(L) 9.7(L) 12.6  Hematocrit 34.8 - 46.6 % 35.5 33.3(L) 39.2  Platelets 145 - 400 10e3/uL 340 264 319    CMP Latest Ref Rng 07/22/2015 02/19/2015 01/28/2015  Glucose 70 - 140 mg/dl 93  96 106(H)  BUN 7.0 - 26.0 mg/dL 8.2 10 9   Creatinine 0.6 - 1.1 mg/dL 0.9 0.95 1.07(H)  Sodium 136 - 145 mEq/L 136 136 133(L)  Potassium 3.5 - 5.1 mEq/L 3.8 3.9 3.5  Chloride 101 - 111 mmol/L - 106 103  CO2 22 - 29 mEq/L 21(L) 24 22  Calcium 8.4 - 10.4 mg/dL 8.2(L) 8.4(L) 8.6(L)  Total Protein 6.4 - 8.3 g/dL 7.0 7.1 -  Total Bilirubin 0.20 - 1.20 mg/dL <0.30 0.5 -  Alkaline Phos 40 - 150 U/L 62 59 -  AST 5 - 34 U/L 12 14(L) -  ALT 0 - 55 U/L 9 13(L) -   Results for CAMRYN, RISSE (MRN [id number]) as of 07/26/2015 10:36  Ref. Range  07/22/2015 14:12  Iron Latest Ref Range: 41-142 ug/dL 17 (L)  UIBC Latest Ref Range: 120-384 ug/dL 371  TIBC Latest Ref Range: 236-444 ug/dL 388  %SAT Latest Ref Range: 21-57 % 4 (L)  Ferritin Latest Ref Range: 9-269 ng/ml 7 (L)    REVIEW OF SYSTEMS:  Constitutional: Denies fevers, chills or abnormal weight loss Eyes: Denies blurriness of vision Ears, nose, mouth, throat, and face: Denies mucositis or sore throat Respiratory: Denies cough, dyspnea or wheezes Cardiovascular: Denies palpitation, chest discomfort or lower extremity swelling Gastrointestinal:  Denies nausea, heartburn or change in bowel habits Skin: Denies abnormal skin rashes Lymphatics: Denies new lymphadenopathy or easy bruising Neurological:Denies numbness, tingling or new weaknesses Behavioral/Psych: Mood is stable, no new changes  All other systems were reviewed with the patient and are negative.  PHYSICAL EXAM:  Pressure 114/69, heart rate 93, respiratory rate 18, temperature 98.6  pulse ox 100% GENERAL:alert, no distress and comfortable; Moderately obese.  SKIN: skin color, texture, turgor are normal, no rashes or significant lesions EYES: normal, Conjunctiva are pink and non-injected, sclera clear OROPHARYNX:no exudate, no erythema and lips, buccal mucosa, and tongue normal  NECK: supple, thyroid normal size, non-tender, without nodularity LYMPH:  no palpable lymphadenopathy in the cervical, axillary or inguinal LUNGS: clear to auscultation and percussion with normal breathing effort HEART: regular rate & rhythm and no murmurs and no lower extremity edema ABDOMEN:abdomen soft, non-tender and normal bowel sounds Musculoskeletal:no cyanosis of digits and no clubbing  NEURO: alert & oriented x 3 with fluent speech, no focal motor/sensory deficits  Radiology  No new scans    IMPRESSION: Donna [name] is a 45 y.o. female with a history of    PLAN:  1.  Recurrent DVT/PE.  --She had provoked DVT and PE in  2014, and second episode off on provoked TE in December 2015 after she came off Coumadin. --Her PTT Lupus anticoagulant was 89.1 on 12/05/2012 and decreased Protein C levels.  Her repeated lupus anticoagulant is negative without detection of a lupus anticoagulant. Hypercoagulopathy workup was negative when she was off Coumadin. -I recommend anticoagulation for life long if there is no contraindication such as severe bleeding. -We discussed Different options of anticoagulation. She could not tolerate Xarelto. She had some skin thickening in the neck, questionable skin necrosis from coumadin, resolved now  -Preventive strategy such as avoid cigarette smoking, contraceptives, and aware compression stocks on [employer]. She Voiced Good Understanding. -she is tolerating Coumadin well, we'll continue and follow up with her PCP    2. Anemia of iron deficiency, likely secondary to menorrhagia -She had fairly low ferritin and iron level, all consistent with iron deficiency. -She had good response to oral iron supplement in the past, but she  is not very complaint, and her anemia got worse lately. I recommend her to have IV feraheme. The benefit and potential side effects, especially allergy reaction, were discussed. She agrees to proceed.  -I will called in Ferous sulfate 325mg  bid for her today, she agrees to take -she will follow up with her GYN for menorrhagia -she never had colonoscopy before, declined for now, but agree to have stool OB tests. We gave her 2 stool cards today   3. Obesity. -- Continued dieting and exercises per her PCP.     Follow-up. -she will resume oral iron pill, I called in ferrous sulfate  -she will return in one week for second dose feraheme  -Repeat lab in one month and next visit  -I'll see her back on 4/10 as scheduled  Truitt Merle, MD 09/10/2015

## 2015-09-10 NOTE — Telephone Encounter (Signed)
Per chemo RN and pof I have sccheduled appt

## 2015-09-17 ENCOUNTER — Ambulatory Visit (HOSPITAL_BASED_OUTPATIENT_CLINIC_OR_DEPARTMENT_OTHER): Payer: Self-pay

## 2015-09-17 VITALS — BP 113/63 | HR 97 | Temp 98.4°F | Resp 18

## 2015-09-17 DIAGNOSIS — D509 Iron deficiency anemia, unspecified: Secondary | ICD-10-CM

## 2015-09-17 LAB — FECAL OCCULT BLOOD, GUAIAC: Occult Blood: NEGATIVE

## 2015-09-17 MED ORDER — SODIUM CHLORIDE 0.9 % IV SOLN
Freq: Once | INTRAVENOUS | Status: AC
  Administered 2015-09-17: 14:00:00 via INTRAVENOUS

## 2015-09-17 MED ORDER — SODIUM CHLORIDE 0.9 % IV SOLN
510.0000 mg | Freq: Once | INTRAVENOUS | Status: AC
  Administered 2015-09-17: 510 mg via INTRAVENOUS
  Filled 2015-09-17: qty 17

## 2015-09-17 NOTE — Patient Instructions (Signed)

## 2015-10-04 ENCOUNTER — Other Ambulatory Visit: Payer: Self-pay | Admitting: Family Medicine

## 2015-10-04 MED FILL — WARFARIN SODIUM 5 MG TABLET: 5 | 30 days supply | Qty: 60 | Fill #0

## 2015-10-06 ENCOUNTER — Telehealth: Payer: Self-pay | Admitting: Family Medicine

## 2015-10-06 NOTE — Telephone Encounter (Signed)
Pt. Called requesting to speak with the pharmacist regarding her INR. Please f/u

## 2015-10-06 NOTE — Telephone Encounter (Signed)
Patient forgot to schedule her appointment and was calling to see when she was due to come back. She is due for an INR check this week so scheduled appointment for patient tomorrow. Patient verbalized understanding.

## 2015-10-07 ENCOUNTER — Ambulatory Visit: Payer: Medicaid Other | Attending: Family Medicine | Admitting: Pharmacist

## 2015-10-07 LAB — POCT INR: INR: 1.8

## 2015-10-08 ENCOUNTER — Other Ambulatory Visit (HOSPITAL_BASED_OUTPATIENT_CLINIC_OR_DEPARTMENT_OTHER): Payer: Self-pay

## 2015-10-08 DIAGNOSIS — D509 Iron deficiency anemia, unspecified: Secondary | ICD-10-CM

## 2015-10-08 DIAGNOSIS — D500 Iron deficiency anemia secondary to blood loss (chronic): Secondary | ICD-10-CM

## 2015-10-08 LAB — CBC & DIFF AND RETIC
BASO%: 1.2 % (ref 0.0–2.0)
BASOS ABS: 0.1 10*3/uL (ref 0.0–0.1)
EOS%: 2.5 % (ref 0.0–7.0)
Eosinophils Absolute: 0.1 10*3/uL (ref 0.0–0.5)
HEMATOCRIT: 41.2 % (ref 34.8–46.6)
HEMOGLOBIN: 13.1 g/dL (ref 11.6–15.9)
Immature Retic Fract: 6.8 % (ref 1.60–10.00)
LYMPH%: 38.1 % (ref 14.0–49.7)
MCH: 25.3 pg (ref 25.1–34.0)
MCHC: 31.8 g/dL (ref 31.5–36.0)
MCV: 79.3 fL — ABNORMAL LOW (ref 79.5–101.0)
MONO#: 0.5 10*3/uL (ref 0.1–0.9)
MONO%: 10.1 % (ref 0.0–14.0)
NEUT#: 2.4 10*3/uL (ref 1.5–6.5)
NEUT%: 48.1 % (ref 38.4–76.8)
Platelets: 306 10*3/uL (ref 145–400)
RBC: 5.2 10*6/uL (ref 3.70–5.45)
RDW: 33.1 % — ABNORMAL HIGH (ref 11.2–14.5)
RETIC CT ABS: 76.96 10*3/uL (ref 33.70–90.70)
Retic %: 1.48 % (ref 0.70–2.10)
WBC: 4.9 10*3/uL (ref 3.9–10.3)
lymph#: 1.9 10*3/uL (ref 0.9–3.3)

## 2015-10-08 LAB — IRON AND TIBC
%SAT: 16 % — AB (ref 21–57)
IRON: 43 ug/dL (ref 41–142)
TIBC: 268 ug/dL (ref 236–444)
UIBC: 225 ug/dL (ref 120–384)

## 2015-10-08 LAB — FERRITIN: Ferritin: 89 ng/ml (ref 9–269)

## 2015-10-14 ENCOUNTER — Ambulatory Visit: Payer: Medicaid Other | Attending: Family Medicine | Admitting: Pharmacist

## 2015-10-14 LAB — POCT INR: INR: 2.2

## 2015-10-25 ENCOUNTER — Encounter: Payer: Self-pay | Admitting: Hematology

## 2015-10-25 ENCOUNTER — Telehealth: Payer: Self-pay | Admitting: Hematology

## 2015-10-25 ENCOUNTER — Other Ambulatory Visit (HOSPITAL_BASED_OUTPATIENT_CLINIC_OR_DEPARTMENT_OTHER): Payer: Self-pay

## 2015-10-25 ENCOUNTER — Ambulatory Visit (HOSPITAL_BASED_OUTPATIENT_CLINIC_OR_DEPARTMENT_OTHER): Payer: Self-pay | Admitting: Hematology

## 2015-10-25 VITALS — BP 123/75 | HR 88 | Temp 98.1°F | Resp 20 | Wt 290.1 lb

## 2015-10-25 DIAGNOSIS — D500 Iron deficiency anemia secondary to blood loss (chronic): Secondary | ICD-10-CM

## 2015-10-25 DIAGNOSIS — D509 Iron deficiency anemia, unspecified: Secondary | ICD-10-CM

## 2015-10-25 DIAGNOSIS — N920 Excessive and frequent menstruation with regular cycle: Secondary | ICD-10-CM

## 2015-10-25 LAB — CBC & DIFF AND RETIC
BASO%: 0.4 % (ref 0.0–2.0)
Basophils Absolute: 0 10*3/uL (ref 0.0–0.1)
EOS ABS: 0.1 10*3/uL (ref 0.0–0.5)
EOS%: 2.1 % (ref 0.0–7.0)
HCT: 38.8 % (ref 34.8–46.6)
HEMOGLOBIN: 12.6 g/dL (ref 11.6–15.9)
IMMATURE RETIC FRACT: 17.7 % — AB (ref 1.60–10.00)
LYMPH#: 1.9 10*3/uL (ref 0.9–3.3)
LYMPH%: 36.1 % (ref 14.0–49.7)
MCH: 26.9 pg (ref 25.1–34.0)
MCHC: 32.5 g/dL (ref 31.5–36.0)
MCV: 82.9 fL (ref 79.5–101.0)
MONO#: 0.5 10*3/uL (ref 0.1–0.9)
MONO%: 9.5 % (ref 0.0–14.0)
NEUT%: 51.9 % (ref 38.4–76.8)
NEUTROS ABS: 2.8 10*3/uL (ref 1.5–6.5)
Platelets: 346 10*3/uL (ref 145–400)
RBC: 4.68 10*6/uL (ref 3.70–5.45)
RETIC %: 2.57 % — AB (ref 0.70–2.10)
RETIC CT ABS: 120.28 10*3/uL — AB (ref 33.70–90.70)
WBC: 5.4 10*3/uL (ref 3.9–10.3)

## 2015-10-25 LAB — IRON AND TIBC
%SAT: 13 % — AB (ref 21–57)
IRON: 35 ug/dL — AB (ref 41–142)
TIBC: 273 ug/dL (ref 236–444)
UIBC: 237 ug/dL (ref 120–384)

## 2015-10-25 LAB — FERRITIN: Ferritin: 36 ng/ml (ref 9–269)

## 2015-10-25 NOTE — Progress Notes (Signed)
[facility] Telephone:(336) [phone]   [phone]  Follow up note    Name: Donna [name] Date: 10/25/2015 MRN: [id number] DOB: 1970/09/29  PCP: Minerva Ends, MD    CHIEF COMPLAIN: follow up recurrent DVT/PE, iron deficient anemia   HISTORY OF PRESENT ILLNESS:Donna [name] is a 45 y.o. 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  Her DVT was thought to have been provoked by a leg spriain that happened a few days before her developing symptoms.  She had a hypercoagulable workup which was positive for lupus anticoagulant and decreased protein C levels.  She was discharge from the hospital on 5/28, and today she is inquiring whether she can come off anticoagulation.  She denies any prior DVTs or blood clots or family history of blood clots in the past.   She denies any melena or hematochezia or bleeding complications while on coumadin.  She has one child without a history of having miscarriages.  Due to recent subtherapeutic INR levels, she was started on 10 mg of coumadin on 06/03/2013.  Her INR on 05/28/2013 was 1.4 and on 06/02/2013 was 1.5.   She had repeat hypercoagulable workup on 06/05/2013 which revealed lupus anticoagulant was not detected.  She was negative for factor V mutation and prothrombin II gene mutation.  Her anticardiolipin were alo negative.  Antithrombin III levels were in normal range.      CURRENT THERAPY: Coumadin, oral iron pills, IV feraheme as needed (received on 09/10/2015, 09/17/2015)  INTERIM HISTORY: Ms Sunga returns for follow up. She is doing well overall. She tolerated IV Feraheme very well, and did feel better overall after infusion. She has no other complaints. Her menstrual period is still having for the first 2 days, although overall likely than before. She is scheduled to see her gynecologist later this months.  PAST MEDICAL HISTORY:  has a past medical history of Lung collapse; Pulmonary embolism,  blood-clot, obstetric; and DVT (deep venous thrombosis) ([place]) (12/06/2012).     PAST SURGICAL HISTORY: Past Surgical History  Procedure Laterality Date  . Rib resection     CURRENT MEDICATIONS Current Outpatient Prescriptions on File Prior to Visit  Medication Sig Dispense Refill  . ferrous sulfate 325 (65 FE) MG tablet Take 1 tablet (325 mg total) by mouth 2 (two) times daily with a meal. 60 tablet 3  . warfarin (COUMADIN) 5 MG tablet Take 2 tablets (10 mg total) by mouth daily. As directed by [facility] 60 tablet 3  . acetaminophen (TYLENOL) 325 MG tablet Take 650 mg by mouth daily as needed for mild pain, moderate pain or headache. Reported on 10/25/2015     No current facility-administered medications on file prior to visit.  ;  ALLERGIES: Review of patient's allergies indicates no known allergies.   SOCIAL HISTORY:  reports that she has never smoked. She has never used smokeless tobacco. She reports that she drinks alcohol. She reports that she does not use illicit drugs.   FAMILY HISTORY: family history includes COPD in her father; Stroke in her father.   LABORATORY DATA:   CBC CBC Latest Ref Rng 10/25/2015 10/08/2015 08/30/2015  WBC 3.9 - 10.3 10e3/uL 5.4 4.9 5.6  Hemoglobin 11.6 - 15.9 g/dL 12.6 13.1 10.9(L)  Hematocrit 34.8 - 46.6 % 38.8 41.2 35.5  Platelets 145 - 400 10e3/uL 346 306 340    CMP Latest Ref Rng 07/22/2015 02/19/2015 01/28/2015  Glucose 70 - 140 mg/dl 93  96 106(H)  BUN 7.0 - 26.0 mg/dL 8.2 10 9   Creatinine 0.6 - 1.1 mg/dL 0.9 0.95 1.07(H)  Sodium 136 - 145 mEq/L 136 136 133(L)  Potassium 3.5 - 5.1 mEq/L 3.8 3.9 3.5  Chloride 101 - 111 mmol/L - 106 103  CO2 22 - 29 mEq/L 21(L) 24 22  Calcium 8.4 - 10.4 mg/dL 8.2(L) 8.4(L) 8.6(L)  Total Protein 6.4 - 8.3 g/dL 7.0 7.1 -  Total Bilirubin 0.20 - 1.20 mg/dL <0.30 0.5 -  Alkaline Phos 40 - 150 U/L 62 59 -  AST 5 - 34 U/L 12 14(L) -  ALT 0 - 55 U/L 9 13(L) -   Today's study result is still  pending  REVIEW OF SYSTEMS:  Constitutional: Denies fevers, chills or abnormal weight loss Eyes: Denies blurriness of vision Ears, nose, mouth, throat, and face: Denies mucositis or sore throat Respiratory: Denies cough, dyspnea or wheezes Cardiovascular: Denies palpitation, chest discomfort or lower extremity swelling Gastrointestinal:  Denies nausea, heartburn or change in bowel habits Skin: Denies abnormal skin rashes Lymphatics: Denies new lymphadenopathy or easy bruising Neurological:Denies numbness, tingling or new weaknesses Behavioral/Psych: Mood is stable, no new changes  All other systems were reviewed with the patient and are negative.  PHYSICAL EXAM:  BP 123/75 mmHg  Pulse 88  Temp(Src) 98.1 F (36.7 C) (Oral)  Resp 20  Wt 290 lb 1.6 oz (131.588 kg)  SpO2 99%  GENERAL:alert, no distress and comfortable; Moderately obese.  SKIN: skin color, texture, turgor are normal, no rashes or significant lesions EYES: normal, Conjunctiva are pink and non-injected, sclera clear OROPHARYNX:no exudate, no erythema and lips, buccal mucosa, and tongue normal  NECK: supple, thyroid normal size, non-tender, without nodularity LYMPH:  no palpable lymphadenopathy in the cervical, axillary or inguinal LUNGS: clear to auscultation and percussion with normal breathing effort HEART: regular rate & rhythm and no murmurs and no lower extremity edema ABDOMEN:abdomen soft, non-tender and normal bowel sounds Musculoskeletal:no cyanosis of digits and no clubbing  NEURO: alert & oriented x 3 with fluent speech, no focal motor/sensory deficits  Radiology  No new scans    IMPRESSION: Donna [name] is a 45 y.o. female with a history of    PLAN:  1. Anemia of iron deficiency, likely secondary to menorrhagia -She had fairly low ferritin and iron level, all consistent with iron deficiency. -She had good response to oral iron supplement in the past, but she is not very complaint, and her anemia  got worse lately. I recommend her to have IV feraheme. She responded well, anemia resolved afterwards. -She will continue Ferous sulfate 325mg  bid, she is tolerating well. -she will follow up with her GYN for menorrhagia -Her stool cards were negative for OB, we'll hold on colonoscopy for now.  2.  Recurrent DVT/PE.  --She had provoked DVT and PE in 2014, and second episode off on provoked TE in December 2015 after she came off Coumadin. --Her PTT Lupus anticoagulant was 89.1 on 12/05/2012 and decreased Protein C levels.  Her repeated lupus anticoagulant is negative without detection of a lupus anticoagulant. Hypercoagulopathy workup was negative when she was off Coumadin. -I recommend anticoagulation for life long if there is no contraindication such as severe bleeding. -We discussed Different options of anticoagulation. She could not tolerate Xarelto. She had some skin thickening in the neck, questionable skin necrosis from coumadin, resolved now  -Preventive strategy such as avoid cigarette smoking, contraceptives, and aware compression stocks on [facility] [employer]. She Voiced Good Understanding. -  she is tolerating Coumadin well, we'll continue and follow up with her PCP    3. Obesity. -- Continued dieting and exercises per her PCP.     Follow-up. -She will continue oral ferrous sulfate twice daily -Repeat study in 2 months, she'll call us after lab to see if she needs IV Feraheme. I'll set of IV Feraheme if she develops anemia or ferritin less than 30 -I'll see her back in 4 months with lab.  Truitt Merle, MD 10/25/2015

## 2015-10-25 NOTE — Telephone Encounter (Signed)
Gave and printed appt sched and avs for pt for June and Aug °

## 2015-11-05 ENCOUNTER — Telehealth: Payer: Self-pay | Admitting: *Deleted

## 2015-11-05 NOTE — Telephone Encounter (Signed)
Called pt & informed that ferritin 36 & goal per Dr Burr Medico is >30.  Iron is slightly low & Dr Burr Medico wants her to keep taking iron & repeat labs in 2 months.  She already has an appt for June & expressed understanding.

## 2015-11-11 MED FILL — WARFARIN SODIUM 5 MG TABLET: 5 | 30 days supply | Qty: 60 | Fill #1

## 2015-11-30 ENCOUNTER — Ambulatory Visit: Payer: Medicaid Other | Attending: Family Medicine | Admitting: Pharmacist

## 2015-11-30 LAB — POCT INR: INR: 3.4

## 2015-12-14 ENCOUNTER — Ambulatory Visit: Payer: Medicaid Other | Attending: Family Medicine | Admitting: Pharmacist

## 2015-12-14 LAB — POCT INR: INR: 2.5

## 2015-12-14 MED ORDER — WARFARIN SODIUM 5 MG PO TABS: 10.0000 mg | ORAL_TABLET | Freq: Every day | ORAL | Status: DC

## 2015-12-14 MED FILL — WARFARIN SODIUM 5 MG TABLET: 5 | 30 days supply | Qty: 60 | Fill #2

## 2015-12-20 ENCOUNTER — Other Ambulatory Visit (HOSPITAL_BASED_OUTPATIENT_CLINIC_OR_DEPARTMENT_OTHER): Payer: Self-pay

## 2015-12-20 DIAGNOSIS — D500 Iron deficiency anemia secondary to blood loss (chronic): Secondary | ICD-10-CM

## 2015-12-20 DIAGNOSIS — D509 Iron deficiency anemia, unspecified: Secondary | ICD-10-CM

## 2015-12-20 LAB — IRON AND TIBC
%SAT: 12 % — ABNORMAL LOW (ref 21–57)
Iron: 32 ug/dL — ABNORMAL LOW (ref 41–142)
TIBC: 276 ug/dL (ref 236–444)
UIBC: 244 ug/dL (ref 120–384)

## 2015-12-20 LAB — FERRITIN: Ferritin: 24 ng/ml (ref 9–269)

## 2015-12-20 LAB — CBC & DIFF AND RETIC
BASO%: 0.6 % (ref 0.0–2.0)
Basophils Absolute: 0 10*3/uL (ref 0.0–0.1)
EOS%: 1.9 % (ref 0.0–7.0)
Eosinophils Absolute: 0.1 10*3/uL (ref 0.0–0.5)
HCT: 40.5 % (ref 34.8–46.6)
HGB: 13.4 g/dL (ref 11.6–15.9)
Immature Retic Fract: 9.3 % (ref 1.60–10.00)
LYMPH#: 2.1 10*3/uL (ref 0.9–3.3)
LYMPH%: 43.5 % (ref 14.0–49.7)
MCH: 28.9 pg (ref 25.1–34.0)
MCHC: 33.1 g/dL (ref 31.5–36.0)
MCV: 87.3 fL (ref 79.5–101.0)
MONO#: 0.4 10*3/uL (ref 0.1–0.9)
MONO%: 8.8 % (ref 0.0–14.0)
NEUT#: 2.2 10*3/uL (ref 1.5–6.5)
NEUT%: 45.2 % (ref 38.4–76.8)
PLATELETS: 300 10*3/uL (ref 145–400)
RBC: 4.64 10*6/uL (ref 3.70–5.45)
RDW: 16.7 % — AB (ref 11.2–14.5)
RETIC CT ABS: 93.26 10*3/uL — AB (ref 33.70–90.70)
Retic %: 2.01 % (ref 0.70–2.10)
WBC: 4.8 10*3/uL (ref 3.9–10.3)

## 2015-12-22 ENCOUNTER — Telehealth: Payer: Self-pay | Admitting: *Deleted

## 2015-12-22 ENCOUNTER — Telehealth: Payer: Self-pay | Admitting: Hematology

## 2015-12-22 NOTE — Telephone Encounter (Signed)
Per staff message and POF I have scheduled appts. Advised scheduler of appts. JMW  

## 2015-12-22 NOTE — Telephone Encounter (Signed)
Called pt & informed of low iron level & suggestion for IV iron per Dr Burr Medico.  Pt agrees & POF to scheduler.

## 2015-12-22 NOTE — Telephone Encounter (Signed)
-----   Message from Truitt Merle, MD sent at 12/21/2015 10:48 PM EDT ----- Please call pt and let her know that her iron level is low, and I recommend IV feraheme once. If she agrees, please send POF.  Thanks,  Truitt Merle

## 2015-12-22 NOTE — Telephone Encounter (Signed)
spoke w/ pt confirmed 6/9 apt , notified dr Burr Medico per Gardiner Sleeper

## 2015-12-24 ENCOUNTER — Other Ambulatory Visit: Payer: Self-pay | Admitting: Hematology

## 2015-12-24 ENCOUNTER — Ambulatory Visit (HOSPITAL_BASED_OUTPATIENT_CLINIC_OR_DEPARTMENT_OTHER): Payer: Self-pay

## 2015-12-24 VITALS — BP 135/73 | HR 77 | Temp 98.5°F | Resp 20

## 2015-12-24 DIAGNOSIS — D509 Iron deficiency anemia, unspecified: Secondary | ICD-10-CM

## 2015-12-24 MED ORDER — SODIUM CHLORIDE 0.9 % IV SOLN
510.0000 mg | Freq: Once | INTRAVENOUS | Status: AC
  Administered 2015-12-24: 510 mg via INTRAVENOUS
  Filled 2015-12-24: qty 17

## 2015-12-24 MED ORDER — SODIUM CHLORIDE 0.9 % IV SOLN
Freq: Once | INTRAVENOUS | Status: AC
  Administered 2015-12-24: 13:00:00 via INTRAVENOUS

## 2015-12-24 NOTE — Patient Instructions (Signed)

## 2016-01-04 ENCOUNTER — Ambulatory Visit: Payer: Medicaid Other | Attending: Family Medicine | Admitting: Pharmacist

## 2016-01-04 LAB — POCT INR: INR: 2.2

## 2016-01-12 ENCOUNTER — Other Ambulatory Visit: Payer: Self-pay | Admitting: Hematology

## 2016-01-12 MED FILL — WARFARIN SODIUM 5 MG TABLET: 5 | 30 days supply | Qty: 60 | Fill #3

## 2016-02-01 ENCOUNTER — Ambulatory Visit: Payer: Medicaid Other | Attending: Family Medicine | Admitting: Pharmacist

## 2016-02-01 LAB — POCT INR: INR: 3

## 2016-02-11 ENCOUNTER — Other Ambulatory Visit: Payer: Self-pay | Admitting: Pharmacist

## 2016-02-11 MED ORDER — WARFARIN SODIUM 5 MG PO TABS: 10.0000 mg | ORAL_TABLET | Freq: Every day | ORAL | 3 refills | Status: DC

## 2016-02-11 MED FILL — WARFARIN SODIUM 5 MG TABLET: 5 | 30 days supply | Qty: 60 | Fill #0

## 2016-02-18 ENCOUNTER — Telehealth: Payer: Self-pay | Admitting: Hematology

## 2016-02-18 NOTE — Telephone Encounter (Signed)
cld & spoke to pt and r/s appt per Dr Leonard Schwartz pt r/s appt on 8/25@1 :45

## 2016-02-21 ENCOUNTER — Other Ambulatory Visit: Payer: Medicaid Other

## 2016-02-21 ENCOUNTER — Ambulatory Visit: Payer: Medicaid Other | Admitting: Hematology

## 2016-03-07 ENCOUNTER — Ambulatory Visit: Payer: Medicaid Other | Attending: Internal Medicine | Admitting: Pharmacist

## 2016-03-07 LAB — POCT INR: INR: 3.5

## 2016-03-07 MED FILL — WARFARIN SODIUM 5 MG TABLET: 5 | 30 days supply | Qty: 60 | Fill #1

## 2016-03-10 ENCOUNTER — Encounter: Payer: Self-pay | Admitting: Hematology

## 2016-03-10 ENCOUNTER — Telehealth: Payer: Self-pay | Admitting: Hematology

## 2016-03-10 ENCOUNTER — Other Ambulatory Visit (HOSPITAL_BASED_OUTPATIENT_CLINIC_OR_DEPARTMENT_OTHER): Payer: Self-pay

## 2016-03-10 ENCOUNTER — Ambulatory Visit (HOSPITAL_BASED_OUTPATIENT_CLINIC_OR_DEPARTMENT_OTHER): Payer: Self-pay | Admitting: Hematology

## 2016-03-10 VITALS — BP 103/71 | HR 87 | Temp 98.5°F | Resp 16 | Ht 69.0 in | Wt 285.3 lb

## 2016-03-10 DIAGNOSIS — Z86718 Personal history of other venous thrombosis and embolism: Secondary | ICD-10-CM

## 2016-03-10 DIAGNOSIS — D500 Iron deficiency anemia secondary to blood loss (chronic): Secondary | ICD-10-CM

## 2016-03-10 DIAGNOSIS — D509 Iron deficiency anemia, unspecified: Secondary | ICD-10-CM

## 2016-03-10 DIAGNOSIS — N920 Excessive and frequent menstruation with regular cycle: Secondary | ICD-10-CM

## 2016-03-10 LAB — CBC & DIFF AND RETIC
BASO%: 0.7 % (ref 0.0–2.0)
Basophils Absolute: 0 10*3/uL (ref 0.0–0.1)
EOS%: 1.6 % (ref 0.0–7.0)
Eosinophils Absolute: 0.1 10*3/uL (ref 0.0–0.5)
HCT: 39.6 % (ref 34.8–46.6)
HGB: 13.3 g/dL (ref 11.6–15.9)
Immature Retic Fract: 11.1 % — ABNORMAL HIGH (ref 1.60–10.00)
LYMPH#: 2.2 10*3/uL (ref 0.9–3.3)
LYMPH%: 48.9 % (ref 14.0–49.7)
MCH: 30.1 pg (ref 25.1–34.0)
MCHC: 33.6 g/dL (ref 31.5–36.0)
MCV: 89.6 fL (ref 79.5–101.0)
MONO#: 0.4 10*3/uL (ref 0.1–0.9)
MONO%: 9 % (ref 0.0–14.0)
NEUT%: 39.8 % (ref 38.4–76.8)
NEUTROS ABS: 1.8 10*3/uL (ref 1.5–6.5)
Platelets: 289 10*3/uL (ref 145–400)
RBC: 4.42 10*6/uL (ref 3.70–5.45)
RDW: 14.7 % — AB (ref 11.2–14.5)
RETIC %: 3.05 % — AB (ref 0.70–2.10)
Retic Ct Abs: 134.81 10*3/uL — ABNORMAL HIGH (ref 33.70–90.70)
WBC: 4.5 10*3/uL (ref 3.9–10.3)

## 2016-03-10 LAB — IRON AND TIBC
%SAT: 17 % — ABNORMAL LOW (ref 21–57)
Iron: 49 ug/dL (ref 41–142)
TIBC: 288 ug/dL (ref 236–444)
UIBC: 238 ug/dL (ref 120–384)

## 2016-03-10 LAB — FERRITIN: Ferritin: 29 ng/ml (ref 9–269)

## 2016-03-10 NOTE — Telephone Encounter (Signed)
Gave patient avs report and appointments for October thru February

## 2016-03-10 NOTE — Progress Notes (Signed)
No treatment scheduled as of today.

## 2016-03-10 NOTE — Progress Notes (Signed)
[facility] Telephone:([phone]) [phone]   [phone]) [phone]  Follow up note    Name: Donna [name] Date: 03/10/2016 MRN: [id number] DOB: 10-12-1970  PCP: Donna Ends, MD    CHIEF COMPLAIN: follow up recurrent DVT/PE, iron deficient anemia   HISTORY OF PRESENT ILLNESS:Donna [name] is a 45 y.o. 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  Her DVT was thought to have been provoked by a leg spriain that happened a few days before her developing symptoms.  She had a hypercoagulable workup which was positive for lupus anticoagulant and decreased protein C levels.  She was discharge from the hospital on 5/28, and today she is inquiring whether she can come off anticoagulation.  She denies any prior DVTs or blood clots or family history of blood clots in the past.   She denies any melena or hematochezia or bleeding complications while on coumadin.  She has one child without a history of having miscarriages.  Due to recent subtherapeutic INR levels, she was started on 10 mg of coumadin on 06/03/2013.  Her INR on 05/28/2013 was 1.4 and on 06/02/2013 was 1.5.   She had repeat hypercoagulable workup on 06/05/2013 which revealed lupus anticoagulant was not detected.  She was negative for factor V mutation and prothrombin II gene mutation.  Her anticardiolipin were alo negative.  Antithrombin III levels were in normal range.      CURRENT THERAPY: Coumadin, oral iron pills, IV feraheme as needed (received on 09/10/2015, 09/17/2015, 12/24/2015)  INTERIM HISTORY: Donna [name] returns for follow up. She is doing well overall. She still has heavy menstrual period, especially for the first 2 days. Last about 7 days. She denies any other bleedings. She has been compliant with oral iron pill, the last IV Feraheme was 2 and half months ago.   PAST MEDICAL HISTORY:  has a past medical history of DVT (deep venous thrombosis) ([place]) (12/06/2012); Lung collapse; and Pulmonary  embolism, blood-clot, obstetric.     PAST SURGICAL HISTORY: Past Surgical History:  Procedure Laterality Date  . RIB RESECTION     CURRENT MEDICATIONS Current Outpatient Prescriptions on File Prior to Visit  Medication Sig Dispense Refill  . acetaminophen (TYLENOL) 325 MG tablet Take 650 mg by mouth daily as needed for mild pain, moderate pain or headache. Reported on 10/25/2015    . ferrous sulfate 325 (65 FE) MG tablet One tablet orally twice daily with a meal 60 tablet 2  . warfarin (COUMADIN) 5 MG tablet Take 2 tablets (10 mg total) by mouth daily. As directed by [facility] 60 tablet 3   No current facility-administered medications on file prior to visit.   ;  ALLERGIES: Review of patient's allergies indicates no known allergies.   SOCIAL HISTORY:  reports that she has never smoked. She has never used smokeless tobacco. She reports that she drinks alcohol. She reports that she does not use drugs.   FAMILY HISTORY: family history includes COPD in her father; Stroke in her father.   LABORATORY DATA:   CBC CBC Latest Ref Rng & Units 03/10/2016 12/20/2015 10/25/2015  WBC 3.9 - 10.3 10e3/uL 4.5 4.8 5.4  Hemoglobin 11.6 - 15.9 g/dL 13.3 13.4 12.6  Hematocrit 34.8 - 46.6 % 39.6 40.5 38.8  Platelets 145 - 400 10e3/uL 289 300 346    CMP Latest Ref Rng & Units 07/22/2015 02/19/2015 01/28/2015  Glucose 70 - 140 mg/dl 93 96 106(H)  BUN 7.0 -  26.0 mg/dL 8.2 10 9   Creatinine 0.6 - 1.1 mg/dL 0.9 0.95 1.07(H)  Sodium 136 - 145 mEq/L 136 136 133(L)  Potassium 3.5 - 5.1 mEq/L 3.8 3.9 3.5  Chloride 101 - 111 mmol/L - 106 103  CO2 22 - 29 mEq/L 21(L) 24 22  Calcium 8.4 - 10.4 mg/dL 8.2(L) 8.4(L) 8.6(L)  Total Protein 6.4 - 8.3 g/dL 7.0 7.1 -  Total Bilirubin 0.20 - 1.20 mg/dL <0.30 0.5 -  Alkaline Phos 40 - 150 U/L 62 59 -  AST 5 - 34 U/L 12 14(L) -  ALT 0 - 55 U/L 9 13(L) -   Results for Donna, [name] (MRN [id number]) as of 03/10/2016 07:09  Ref. Range 10/25/2015 12:43 12/20/2015  13:27  Iron Latest Ref Range: 41 - 142 ug/dL 35 (L) 32 (L)  UIBC Latest Ref Range: 120 - 384 ug/dL 237 244  TIBC Latest Ref Range: 236 - 444 ug/dL 273 276  %SAT Latest Ref Range: 21 - 57 % 13 (L) 12 (L)  Ferritin Latest Ref Range: 9 - 269 ng/ml 36 24    REVIEW OF SYSTEMS:  Constitutional: Denies fevers, chills or abnormal weight loss Eyes: Denies blurriness of vision Ears, nose, mouth, throat, and face: Denies mucositis or sore throat Respiratory: Denies cough, dyspnea or wheezes Cardiovascular: Denies palpitation, chest discomfort or lower extremity swelling Gastrointestinal:  Denies nausea, heartburn or change in bowel habits Skin: Denies abnormal skin rashes Lymphatics: Denies new lymphadenopathy or easy bruising Neurological:Denies numbness, tingling or new weaknesses Behavioral/Psych: Mood is stable, no new changes  All other systems were reviewed with the patient and are negative.  PHYSICAL EXAM:  BP 103/71 (BP Location: Left Arm, Patient Position: Sitting)   Pulse 87   Temp 98.5 F (36.9 C) (Oral)   Resp 16   Ht 5\' 9"  (1.753 m)   Wt 285 lb 4.8 oz (129.4 kg)   SpO2 99%   BMI 42.13 kg/m   GENERAL:alert, no distress and comfortable; Moderately obese.  SKIN: skin color, texture, turgor are normal, no rashes or significant lesions EYES: normal, Conjunctiva are pink and non-injected, sclera clear OROPHARYNX:no exudate, no erythema and lips, buccal mucosa, and tongue normal  NECK: supple, thyroid normal size, non-tender, without nodularity LYMPH:  no palpable lymphadenopathy in the cervical, axillary or inguinal LUNGS: clear to auscultation and percussion with normal breathing effort HEART: regular rate & rhythm and no murmurs and no lower extremity edema ABDOMEN:abdomen soft, non-tender and normal bowel sounds Musculoskeletal:no cyanosis of digits and no clubbing  NEURO: alert & oriented x 3 with fluent speech, no focal motor/sensory deficits  Radiology  No new scans      IMPRESSION: Donna [name] is a 45 y.o. female with a history of    PLAN:  1. Anemia of iron deficiency, likely secondary to menorrhagia -She had fairly low ferritin and iron level, all consistent with iron deficiency. -She had good response to oral iron supplement in the past, but she is not very complaint, and has been receiving IV feraheme every 203 months. She responded well, anemia resolved afterwards. -She will continue Ferous sulfate 325mg  bid, she is tolerating well. -she will follow up with her GYN for menorrhagia -Her stool cards were negative for OB, we'll hold on colonoscopy for now. -IV Feraheme if ferritin less than 50  2.  Recurrent DVT/PE.  --She had provoked DVT and PE in 2014, and second episode off on provoked TE in December 2015 after she came off Coumadin. --Her  PTT Lupus anticoagulant was 89.1 on 12/05/2012 and decreased Protein C levels.  Her repeated lupus anticoagulant is negative without detection of a lupus anticoagulant. Hypercoagulopathy workup was negative when she was off Coumadin. -I recommend anticoagulation for life long if there is no contraindication such as severe bleeding. -We discussed Different options of anticoagulation. She could not tolerate Xarelto. She had some skin thickening in the neck, questionable skin necrosis from coumadin, resolved now  -Preventive strategy such as avoid cigarette smoking, contraceptives, and aware compression stocks on [employer] [employer]. She Voiced Good Understanding. -she is tolerating Coumadin well, we'll continue and follow up with her PCP  -Due to her heavy menstrual period, OK to hold Coumadin for the first few days when she has menstrual period   3. Obesity. -- Continued dieting and exercises per her PCP.     Follow-up. -She will continue oral ferrous sulfate twice daily -Repeat study every 2 months, she'll call us after lab to see if she needs IV Feraheme. I'll set of IV Feraheme if she develops anemia or  ferritin less than 30 -I'll see her back in 6 months with lab.  Truitt Merle, MD 03/10/2016

## 2016-03-11 ENCOUNTER — Encounter: Payer: Self-pay | Admitting: Hematology

## 2016-03-14 ENCOUNTER — Encounter: Payer: Medicaid Other | Admitting: Pharmacist

## 2016-03-15 ENCOUNTER — Ambulatory Visit: Payer: Self-pay | Attending: Family Medicine | Admitting: Pharmacist

## 2016-03-15 LAB — POCT INR: INR: 2.2

## 2016-03-27 ENCOUNTER — Telehealth: Payer: Self-pay | Admitting: *Deleted

## 2016-03-27 NOTE — Telephone Encounter (Signed)
Pt called regarding results of iron studies.  Reviewed labs & discussed with Dr Burr Medico & OK to wait for next lab  if pt feeling ok now to determine if she needs iron infusion. Pt states feeling well & OK with this decision & encouraged to call if she feels tired/exhausted.  Pt expressed understanding.

## 2016-04-12 MED FILL — WARFARIN SODIUM 5 MG TABLET: 5 | 30 days supply | Qty: 60 | Fill #2

## 2016-04-18 ENCOUNTER — Ambulatory Visit: Payer: Self-pay | Attending: Family Medicine | Admitting: Pharmacist

## 2016-04-18 LAB — POCT INR: INR: 1.9

## 2016-05-05 ENCOUNTER — Other Ambulatory Visit: Payer: Self-pay

## 2016-05-11 MED FILL — WARFARIN SODIUM 5 MG TABLET: 5 | 30 days supply | Qty: 60 | Fill #3

## 2016-06-07 ENCOUNTER — Ambulatory Visit: Payer: Self-pay | Attending: Internal Medicine | Admitting: Pharmacist

## 2016-06-07 ENCOUNTER — Other Ambulatory Visit: Payer: Self-pay | Admitting: Internal Medicine

## 2016-06-07 LAB — POCT INR: INR: 4.3

## 2016-06-07 MED FILL — WARFARIN SODIUM 5 MG TABLET: 5 | 30 days supply | Qty: 60 | Fill #0

## 2016-06-13 ENCOUNTER — Ambulatory Visit: Payer: Self-pay | Attending: Family Medicine | Admitting: Pharmacist

## 2016-06-13 LAB — POCT INR: INR: 1.3

## 2016-06-20 ENCOUNTER — Encounter: Payer: Self-pay | Admitting: Pharmacist

## 2016-06-21 ENCOUNTER — Ambulatory Visit: Payer: Self-pay | Attending: Family Medicine | Admitting: Pharmacist

## 2016-06-21 ENCOUNTER — Encounter: Payer: Self-pay | Admitting: Pharmacist

## 2016-06-21 LAB — POCT INR: INR: 2.4

## 2016-06-21 NOTE — Progress Notes (Signed)
Patient will be out of the country [place] on a cruise so she will return [facility] in [facility] weeks.

## 2016-06-30 ENCOUNTER — Other Ambulatory Visit: Payer: Self-pay

## 2016-07-06 ENCOUNTER — Ambulatory Visit: Payer: Self-pay | Attending: Internal Medicine | Admitting: Pharmacist

## 2016-07-06 LAB — POCT INR: INR: 2.4

## 2016-07-13 MED FILL — WARFARIN SODIUM 5 MG TABLET: 5 | 30 days supply | Qty: 60 | Fill #1

## 2016-07-25 ENCOUNTER — Ambulatory Visit: Payer: Self-pay | Attending: Family Medicine | Admitting: Pharmacist

## 2016-07-25 LAB — POCT INR: INR: 2.8

## 2016-08-11 MED FILL — WARFARIN SODIUM 5 MG TABLET: 5 | 30 days supply | Qty: 60 | Fill #2

## 2016-08-25 ENCOUNTER — Encounter: Payer: Self-pay | Admitting: Hematology

## 2016-08-25 ENCOUNTER — Other Ambulatory Visit: Payer: Self-pay

## 2016-08-26 NOTE — Progress Notes (Signed)
This encounter was created in error - please disregard.

## 2016-08-30 ENCOUNTER — Telehealth: Payer: Self-pay | Admitting: Hematology

## 2016-08-30 NOTE — Telephone Encounter (Signed)
sw pt to confirm r/s appt to 3/12 at 1245 pm per LOS

## 2016-09-06 ENCOUNTER — Other Ambulatory Visit: Payer: Self-pay | Admitting: Internal Medicine

## 2016-09-07 ENCOUNTER — Ambulatory Visit: Payer: Self-pay | Attending: Family Medicine | Admitting: Pharmacist

## 2016-09-07 LAB — POCT INR: INR: 2.4

## 2016-09-07 MED FILL — ?WARFARIN SODIUM 5 MG TABLE: 5 | 30 days supply | Qty: 60 | Fill #0

## 2016-09-25 ENCOUNTER — Other Ambulatory Visit: Payer: Self-pay

## 2016-09-25 ENCOUNTER — Ambulatory Visit: Payer: Self-pay | Admitting: Hematology

## 2016-09-26 ENCOUNTER — Telehealth: Payer: Self-pay | Admitting: Hematology

## 2016-09-26 NOTE — Telephone Encounter (Signed)
sw pt to confirm 3/22 appt at 1245 pm per LOS

## 2016-10-03 NOTE — Progress Notes (Signed)
Open in error

## 2016-10-03 NOTE — Progress Notes (Signed)
[facility] [phone]   [phone]  Follow up note    Name: Donna [name] Date: 10/05/2016 MRN: [id number] DOB: 03/25/1971  PCP: Minerva Ends, MD    CHIEF COMPLAIN: follow up recurrent DVT/PE, iron deficient anemia   HISTORY OF PRESENT ILLNESS:Donna [name] is a 46 y.o. 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  Her DVT was thought to have been provoked by a leg spriain that happened a few days before her developing symptoms.  She had a hypercoagulable workup which was positive for lupus anticoagulant and decreased protein C levels.  She was discharge from the hospital on 5/28, and today she is inquiring whether she can come off anticoagulation.  She denies any prior DVTs or blood clots or family history of blood clots in the past.   She denies any melena or hematochezia or bleeding complications while on coumadin.  She has one child without a history of having miscarriages.  Due to recent subtherapeutic INR levels, she was started on 10 mg of coumadin on 06/03/2013.  Her INR on 05/28/2013 was 1.4 and on 06/02/2013 was 1.5.   She had repeat hypercoagulable workup on 06/05/2013 which revealed lupus anticoagulant was not detected.  She was negative for factor V mutation and prothrombin II gene mutation.  Her anticardiolipin were alo negative.  Antithrombin III levels were in normal range.      CURRENT THERAPY: Coumadin, oral iron pills, IV feraheme as needed (received on 09/10/2015, 09/17/2015, 12/24/2015)  INTERIM HISTORY: [name] returns for follow up. Things are going good and she is still having her menstrual cycle. They are heavy the first day and usually last 3.5-4 days. Her last period was a few weeks ago, around the 27th. She still takes Coumadin while on period    PAST MEDICAL HISTORY:  has a past medical history of DVT (deep venous thrombosis) ([place]) (12/06/2012); Lung collapse; and Pulmonary embolism, blood-clot,  obstetric.     PAST SURGICAL HISTORY: Past Surgical History:  Procedure Laterality Date  . RIB RESECTION     CURRENT MEDICATIONS Current Outpatient Prescriptions on File Prior to Visit  Medication Sig Dispense Refill  . acetaminophen (TYLENOL) 325 MG tablet Take 650 mg by mouth daily as needed for mild pain, moderate pain or headache. Reported on 10/25/2015    . ferrous sulfate 325 (65 FE) MG tablet One tablet orally twice daily with a meal 60 tablet 2  . warfarin (COUMADIN) 5 MG tablet TAKE 2 TABLETS BY MOUTH DAILY. AS DIRECTED BY COUMADIN CLINIC 60 tablet 2   No current facility-administered medications on file prior to visit.   ;  ALLERGIES: Patient has no known allergies.   SOCIAL HISTORY:  reports that she has never smoked. She has never used smokeless tobacco. She reports that she drinks alcohol. She reports that she does not use drugs.   FAMILY HISTORY: family history includes COPD in her father; Stroke in her father.   LABORATORY DATA:   CBC CBC Latest Ref Rng & Units 10/05/2016 03/10/2016 12/20/2015  WBC 3.9 - 10.3 10e3/uL 5.8 4.5 4.8  Hemoglobin 11.6 - 15.9 g/dL 10.9(L) 13.3 13.4  Hematocrit 34.8 - 46.6 % 35.2 39.6 40.5  Platelets 145 - 400 10e3/uL 282 289 300    CMP Latest Ref Rng & Units 10/05/2016 07/22/2015 02/19/2015  Glucose 70 - 140 mg/dl 97 93 96  BUN 7.0 - 26.0 mg/dL 10.8 8.2 10  Creatinine 0.6 -  1.1 mg/dL 0.9 0.9 0.95  Sodium 136 - 145 mEq/L 137 136 136  Potassium 3.5 - 5.1 mEq/L 3.9 3.8 3.9  Chloride 101 - 111 mmol/L - - 106  CO2 22 - 29 mEq/L 22 21(L) 24  Calcium 8.4 - 10.4 mg/dL 8.7 8.2(L) 8.4(L)  Total Protein 6.4 - 8.3 g/dL 6.8 7.0 7.1  Total Bilirubin 0.20 - 1.20 mg/dL 0.26 <0.30 0.5  Alkaline Phos 40 - 150 U/L 70 62 59  AST 5 - 34 U/L 19 12 14(L)  ALT 0 - 55 U/L 28 9 13(L)   Results for Donna, [name] (MRN [id number]) as of 10/03/2016 16:00  Ref. Range 08/30/2015 14:42 10/08/2015 12:27 10/25/2015 12:43 12/20/2015 13:27 03/10/2016 13:59  Iron Latest  Ref Range: 41 - 142 ug/dL 21 (L) 43 35 (L) 32 (L) 49  UIBC Latest Ref Range: 120 - 384 ug/dL 343 225 237 244 238  TIBC Latest Ref Range: 236 - 444 ug/dL 364 268 273 276 288  %SAT Latest Ref Range: 21 - 57 % 6 (L) 16 (L) 13 (L) 12 (L) 17 (L)  Ferritin Latest Ref Range: 9 - 269 ng/ml 5 (L) 89 36 24 29    REVIEW OF SYSTEMS:  Constitutional: Denies fevers, chills or abnormal weight loss Eyes: Denies blurriness of vision Ears, nose, mouth, throat, and face: Denies mucositis or sore throat Respiratory: Denies cough, dyspnea or wheezes Cardiovascular: Denies palpitation, chest discomfort or lower extremity swelling Gastrointestinal:  Denies nausea, heartburn or change in bowel habits Skin: Denies abnormal skin rashes Lymphatics: Denies new lymphadenopathy or easy bruising Neurological:Denies numbness, tingling or new weaknesses Behavioral/Psych: Mood is stable, no new changes  All other systems were reviewed with the patient and are negative.  PHYSICAL EXAM:  BP 124/72 (BP Location: Right Arm)   Pulse 89   Temp 98.7 F (37.1 C) (Oral)   Resp 18   Ht 5\' 9"  (1.753 m)   Wt 283 lb (128.4 kg)   SpO2 100%   BMI 41.79 kg/m   GENERAL:alert, no distress and comfortable; Moderately obese.  SKIN: skin color, texture, turgor are normal, no rashes or significant lesions EYES: normal, Conjunctiva are pink and non-injected, sclera clear OROPHARYNX:no exudate, no erythema and lips, buccal mucosa, and tongue normal  NECK: supple, thyroid normal size, non-tender, without nodularity LYMPH:  no palpable lymphadenopathy in the cervical, axillary or inguinal LUNGS: clear to auscultation and percussion with normal breathing effort HEART: regular rate & rhythm and no murmurs and no lower extremity edema ABDOMEN:abdomen soft, non-tender and normal bowel sounds Musculoskeletal:no cyanosis of digits and no clubbing  NEURO: alert & oriented x 3 with fluent speech, no focal motor/sensory deficits  Radiology    No new scans    IMPRESSION AND PLAN: [name] is a 46 y.o. female with a history of   1. Anemia of iron deficiency, likely secondary to menorrhagia -She had fairly low ferritin and iron level, all consistent with iron deficiency. -She had good response to oral iron supplement in the past, but she is not very complaint, and has been receiving IV feraheme every 203 months. She responded well, anemia resolved afterwards. -She will continue Ferous sulfate 325mg  bid, she is tolerating well. -she will follow up with her GYN for menorrhagia -Her stool cards were negative for OB, we'll hold on colonoscopy for now. -IV Feraheme if ferritin less than 50 - I have advised the patient if period is heavy she can stop Coudamin for 2-3 days. It will  help slow down bleeding. I encouraged her to stop the day she anticipates her cycle will come on and start back after cycle ends.  - she has developed anemia again, Hb 10.9 today -Her iron level is still pending, likely will be low, I'll set up IV Feraheme for the next 2 weeks - Will continue to monitor. I again encouraged her to continue monitoring her lab in our office every 2 months to see if she needs IV iron. She agrees.   2.  Recurrent DVT/PE.  --She had provoked DVT and PE in 2014, and second episode off on provoked TE in December 2015 after she came off Coumadin. --Her PTT Lupus anticoagulant was 89.1 on 12/05/2012 and decreased Protein C levels.  Her repeated lupus anticoagulant is negative without detection of a lupus anticoagulant. Hypercoagulopathy workup was negative when she was off Coumadin. -I recommend anticoagulation for life long if there is no contraindication such as severe bleeding. -We discussed Different options of anticoagulation. She could not tolerate Xarelto. She had some skin thickening in the neck, questionable skin necrosis from coumadin, resolved now  -Preventive strategy such as avoid cigarette smoking, contraceptives,  and aware compression stocks on [employer] [employer]. She Voiced Good Understanding. -she is tolerating Coumadin well, we'll continue and follow up with her PCP  -Due to her heavy menstrual period, OK to hold Coumadin for the first few days when she has menstrual period   3. Obesity. -- Continued dieting and exercises per her PCP.     Follow-up. Iv feraheme weekly X2 in the next 2-3 weeks Lab in 3, 5 and 7 months  f/u in 7 months    This document serves as a record of services personally performed by Truitt Merle, MD. It was created on her behalf by Brandt Loosen, a trained medical scribe. The creation of this record is based on the scribe's personal observations and the provider's statements to them. This document has been checked and approved by the attending provider.  Truitt Merle, MD 10/05/2016

## 2016-10-05 ENCOUNTER — Telehealth: Payer: Self-pay | Admitting: Hematology

## 2016-10-05 ENCOUNTER — Other Ambulatory Visit (HOSPITAL_BASED_OUTPATIENT_CLINIC_OR_DEPARTMENT_OTHER): Payer: Self-pay

## 2016-10-05 ENCOUNTER — Ambulatory Visit (HOSPITAL_BASED_OUTPATIENT_CLINIC_OR_DEPARTMENT_OTHER): Payer: Self-pay | Admitting: Hematology

## 2016-10-05 VITALS — BP 124/72 | HR 89 | Temp 98.7°F | Resp 18 | Ht 69.0 in | Wt 283.0 lb

## 2016-10-05 DIAGNOSIS — D509 Iron deficiency anemia, unspecified: Secondary | ICD-10-CM

## 2016-10-05 DIAGNOSIS — I2782 Chronic pulmonary embolism: Secondary | ICD-10-CM

## 2016-10-05 LAB — COMPREHENSIVE METABOLIC PANEL
ALBUMIN: 2.9 g/dL — AB (ref 3.5–5.0)
ALT: 28 U/L (ref 0–55)
ANION GAP: 7 meq/L (ref 3–11)
AST: 19 U/L (ref 5–34)
Alkaline Phosphatase: 70 U/L (ref 40–150)
BILIRUBIN TOTAL: 0.26 mg/dL (ref 0.20–1.20)
BUN: 10.8 mg/dL (ref 7.0–26.0)
CALCIUM: 8.7 mg/dL (ref 8.4–10.4)
CO2: 22 meq/L (ref 22–29)
CREATININE: 0.9 mg/dL (ref 0.6–1.1)
Chloride: 108 mEq/L (ref 98–109)
EGFR: 89 mL/min/{1.73_m2} — ABNORMAL LOW (ref >90)
Glucose: 97 mg/dl (ref 70–140)
Potassium: 3.9 mEq/L (ref 3.5–5.1)
Sodium: 137 mEq/L (ref 136–145)
TOTAL PROTEIN: 6.8 g/dL (ref 6.4–8.3)

## 2016-10-05 LAB — CBC WITH DIFFERENTIAL/PLATELET
BASO%: 0.5 % (ref 0.0–2.0)
Basophils Absolute: 0 10*3/uL (ref 0.0–0.1)
EOS%: 3.1 % (ref 0.0–7.0)
Eosinophils Absolute: 0.2 10*3/uL (ref 0.0–0.5)
HCT: 35.2 % (ref 34.8–46.6)
HGB: 10.9 g/dL — ABNORMAL LOW (ref 11.6–15.9)
LYMPH%: 41.7 % (ref 14.0–49.7)
MCH: 22.5 pg — ABNORMAL LOW (ref 25.1–34.0)
MCHC: 31 g/dL — ABNORMAL LOW (ref 31.5–36.0)
MCV: 72.6 fL — ABNORMAL LOW (ref 79.5–101.0)
MONO#: 0.5 10*3/uL (ref 0.1–0.9)
MONO%: 9.2 % (ref 0.0–14.0)
NEUT#: 2.6 10*3/uL (ref 1.5–6.5)
NEUT%: 45.5 % (ref 38.4–76.8)
Platelets: 282 10*3/uL (ref 145–400)
RBC: 4.85 10*6/uL (ref 3.70–5.45)
RDW: 19.6 % — ABNORMAL HIGH (ref 11.2–14.5)
WBC: 5.8 10*3/uL (ref 3.9–10.3)
lymph#: 2.4 10*3/uL (ref 0.9–3.3)

## 2016-10-05 LAB — FERRITIN: Ferritin: 5 ng/ml — ABNORMAL LOW (ref 9–269)

## 2016-10-05 LAB — IRON AND TIBC
%SAT: 5 % — ABNORMAL LOW (ref 21–57)
Iron: 19 ug/dL — ABNORMAL LOW (ref 41–142)
TIBC: 346 ug/dL (ref 236–444)
UIBC: 328 ug/dL (ref 120–384)

## 2016-10-05 NOTE — Telephone Encounter (Signed)
appt made and avs printed per LOS °

## 2016-10-08 ENCOUNTER — Encounter: Payer: Self-pay | Admitting: Hematology

## 2016-10-09 ENCOUNTER — Telehealth: Payer: Self-pay | Admitting: *Deleted

## 2016-10-09 MED FILL — ?WARFARIN SODIUM 5 MG TABLE: 5 | 30 days supply | Qty: 60 | Fill #1

## 2016-10-09 NOTE — Telephone Encounter (Signed)
-----   Message from Truitt Merle, MD sent at 10/08/2016  8:56 PM EDT ----- Please let pt know that her iron level was low, I have scheduled her iv iron during her visit last week. Thanks  Truitt Merle

## 2016-10-09 NOTE — Telephone Encounter (Signed)
Spoke with pt and informed pt of low iron level as per Dr. Burr Medico.  Pt aware of appts for IV Feraheme on 4/4 and 10/25/16.

## 2016-10-17 ENCOUNTER — Ambulatory Visit: Payer: Self-pay | Attending: Family Medicine | Admitting: Pharmacist

## 2016-10-17 LAB — POCT INR: INR: 1.7

## 2016-10-18 ENCOUNTER — Ambulatory Visit (HOSPITAL_BASED_OUTPATIENT_CLINIC_OR_DEPARTMENT_OTHER): Payer: Self-pay

## 2016-10-18 ENCOUNTER — Encounter: Payer: Self-pay | Admitting: Pharmacy Technician

## 2016-10-18 VITALS — BP 118/69 | HR 89 | Temp 98.4°F | Resp 16

## 2016-10-18 DIAGNOSIS — D509 Iron deficiency anemia, unspecified: Secondary | ICD-10-CM

## 2016-10-18 MED ORDER — SODIUM CHLORIDE 0.9 % IV SOLN
510.0000 mg | Freq: Once | INTRAVENOUS | Status: AC
  Administered 2016-10-18: 510 mg via INTRAVENOUS
  Filled 2016-10-18: qty 17

## 2016-10-18 MED ORDER — SODIUM CHLORIDE 0.9 % IV SOLN
Freq: Once | INTRAVENOUS | Status: AC
  Administered 2016-10-18: 10:00:00 via INTRAVENOUS

## 2016-10-18 NOTE — Patient Instructions (Signed)

## 2016-10-25 ENCOUNTER — Ambulatory Visit (HOSPITAL_BASED_OUTPATIENT_CLINIC_OR_DEPARTMENT_OTHER): Payer: Self-pay

## 2016-10-25 VITALS — BP 112/80 | HR 82 | Temp 98.4°F | Resp 18

## 2016-10-25 DIAGNOSIS — D509 Iron deficiency anemia, unspecified: Secondary | ICD-10-CM

## 2016-10-25 MED ORDER — SODIUM CHLORIDE 0.9 % IV SOLN
510.0000 mg | Freq: Once | INTRAVENOUS | Status: AC
  Administered 2016-10-25: 510 mg via INTRAVENOUS
  Filled 2016-10-25: qty 17

## 2016-10-25 NOTE — Patient Instructions (Signed)

## 2016-10-31 ENCOUNTER — Encounter: Payer: Self-pay | Admitting: Pharmacy Technician

## 2016-10-31 NOTE — Progress Notes (Unsigned)
Pt was approved by [employer] for Feraheme contingent upon Letter of Necessity by provider. [employer] will send 2 vials 10/31/16 without letter. Coverage is 10/30/16 - 10/30/17. Next Tx scheduled for 10/31/16.

## 2016-11-10 MED FILL — ?WARFARIN SODIUM 5 MG TABLE: 5 | 30 days supply | Qty: 60 | Fill #2

## 2016-11-13 ENCOUNTER — Ambulatory Visit: Payer: Medicaid Other | Attending: Family Medicine | Admitting: Pharmacist

## 2016-11-13 DIAGNOSIS — Z7901 Long term (current) use of anticoagulants: Secondary | ICD-10-CM | POA: Insufficient documentation

## 2016-11-29 ENCOUNTER — Encounter: Payer: Self-pay | Admitting: Family Medicine

## 2016-12-08 ENCOUNTER — Emergency Department (HOSPITAL_COMMUNITY)
Admission: EM | Admit: 2016-12-08 | Discharge: 2016-12-08 | Disposition: A | Discharge status: Home / Self Care | Payer: Self-pay | Attending: Emergency Medicine | Admitting: Emergency Medicine

## 2016-12-08 ENCOUNTER — Emergency Department (HOSPITAL_BASED_OUTPATIENT_CLINIC_OR_DEPARTMENT_OTHER)
Admission: EM | Admit: 2016-12-08 | Discharge: 2016-12-08 | Disposition: A | Discharge status: Home / Self Care | Payer: Self-pay | Source: Home / Self Care

## 2016-12-08 ENCOUNTER — Encounter (HOSPITAL_COMMUNITY): Payer: Self-pay | Admitting: Emergency Medicine

## 2016-12-08 ENCOUNTER — Emergency Department (HOSPITAL_COMMUNITY): Payer: Self-pay

## 2016-12-08 DIAGNOSIS — Z86718 Personal history of other venous thrombosis and embolism: Secondary | ICD-10-CM | POA: Insufficient documentation

## 2016-12-08 DIAGNOSIS — M25562 Pain in left knee: Secondary | ICD-10-CM | POA: Insufficient documentation

## 2016-12-08 DIAGNOSIS — R079 Chest pain, unspecified: Secondary | ICD-10-CM | POA: Insufficient documentation

## 2016-12-08 DIAGNOSIS — M79609 Pain in unspecified limb: Secondary | ICD-10-CM

## 2016-12-08 DIAGNOSIS — Z79899 Other long term (current) drug therapy: Secondary | ICD-10-CM | POA: Insufficient documentation

## 2016-12-08 DIAGNOSIS — Z7901 Long term (current) use of anticoagulants: Secondary | ICD-10-CM | POA: Insufficient documentation

## 2016-12-08 LAB — I-STAT TROPONIN, ED: TROPONIN I, POC: 0 ng/mL (ref 0.00–0.08)

## 2016-12-08 LAB — I-STAT BETA HCG BLOOD, ED (MC, WL, AP ONLY): I-stat hCG, quantitative: <5 m[IU]/mL (ref <5)

## 2016-12-08 NOTE — Progress Notes (Signed)
*  PRELIMINARY RESULTS* Vascular Ultrasound left lower extremity venous duplex has been completed.  Preliminary findings: No evidence of deep vein thrombosis in the visualized veins of the left lower extremity.  Complex baker's cyst seen left popliteal fossa.  Preliminary results given to Dr. Eulis Foster @ 12:15.   Everrett Coombe 12/08/2016, 12:16 PM

## 2016-12-08 NOTE — ED Provider Notes (Signed)
[facility] DEPT Provider Note    By signing my name below, I, Bea Graff, attest that this documentation has been prepared under the direction and in the presence of [employer], PA-C. Electronically Signed: Bea Graff, ED Scribe. 12/08/16. 12:12 PM.    History   Chief Complaint Chief Complaint  Patient presents with  . Leg Pain    left    The history is provided by the patient and medical records. No language interpreter was used.    Donna [name] is an obese 46 y.o. female with PMHx of DM, HTN, DVT and PE currently taking Coumadin who presents to the Emergency Department complaining of intermittent shooting pain of the LLE from the knee down that began three weeks ago. She reports associated intermittent swelling. She reports intermittent chest aching that she states is normal for her since her first diagnosis of DVT.She has had multiple PE studies with no acute findings. . Last episode of CP was approximately 30 minutes ago. She states the pain is worse with movements and palpation. Pt thought the pain of the LLE began from overexertion. She has not taken anything for pain. Walking and elevating the LLE increases the pain. Using her upper extremities and palpating the chest increases that pain. Resting helps to alleviate the pain. She denies redness, warmth or current swelling of the LLE, SOB. She denies trauma, injury or fall. She reports missing two days of Coumadin five days ago. Pt states she was diagnosed with a DVT of the RLE four years ago (2014) and was positive for Lupus anticoagulant. At a follow up visit, this was not detected. She was negative for Factor V mutation and Prothrombin II mutation. She is followed by Dr. Burr Medico. She discontinued Coumadin in 2015 and had an unprovoked DVT so per Dr. Ernestina Penna orders, she is now a lifelong candidate for Coumadin. She denies smoking. She denies previous cardiac problems or family h/o cardiac problems.   Past Medical  History:  Diagnosis Date  . DVT (deep venous thrombosis) ([place]) 12/06/2012  . Lung collapse   . Pulmonary embolism, blood-clot, obstetric     Patient Active Problem List   Diagnosis Date Noted  . Personal history of venous thrombosis and embolism 08/01/2015  . Venous thrombosis 08/12/2014  . 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 Iron deficiency anemia 07/16/2014  . Pleuritic chest pain 07/16/2014  . High risk medication use 02/12/2013  . Chronic anticoagulation 02/12/2013  . Healthcare maintenance 02/12/2013  . Obesity 12/30/2012  . Menorrhagia 12/16/2012  . Pulmonary embolism ([place]) 12/05/2012  . Anemia 12/05/2012    Past Surgical History:  Procedure Laterality Date  . RIB RESECTION      OB History    Gravida Para Term Preterm AB Living   1 1 1  0 0 1   SAB TAB Ectopic Multiple Live Births   0 0 0 0         Home Medications    Prior to Admission medications   Medication Sig Start Date End Date Taking? Authorizing Provider  ferrous sulfate 325 (65 FE) MG tablet One tablet orally twice daily with a meal 01/12/16  Yes Truitt Merle, MD  Naproxen Sodium (ALEVE) 220 MG CAPS Take 220 mg by mouth every 12 (twelve) hours as needed (pain).   Yes [provider]  warfarin (COUMADIN) 5 MG tablet TAKE 2 TABLETS BY MOUTH DAILY. AS DIRECTED BY COUMADIN CLINIC 09/07/16  Yes Tresa Garter, MD    Family History Family  History  Problem Relation Age of Onset  . COPD Father   . Stroke Father     Social History Social History  Substance Use Topics  . Smoking status: Never Smoker  . Smokeless tobacco: Never Used  . Alcohol use Yes     Comment: socially      Allergies   Patient has no known allergies.   Review of Systems Review of Systems  Respiratory: Negative for shortness of breath.   Cardiovascular: Positive for chest pain and leg swelling.  Musculoskeletal: Positive for myalgias.  Skin: Negative for color change.    Physical Exam Updated Vital  Signs BP 127/73   Pulse 80   Temp 98.2 F (36.8 C) (Oral)   Resp 16   SpO2 98%   Physical Exam  Constitutional: She is oriented to person, place, and time. She appears well-developed and well-nourished.  HENT:  Head: Normocephalic and atraumatic.  Neck: Normal range of motion.  Cardiovascular: Normal rate.   Pulmonary/Chest: Effort normal.  Musculoskeletal: Normal range of motion. She exhibits tenderness. She exhibits no edema or deformity.  Knees equal bilaterally. No swelling or edema. No warmth to touch. Full active ROM. No significant laxity. Mild TTP to anterior joint line. No swelling or edema to LLE.  Neurological: She is alert and oriented to person, place, and time.  Skin: Skin is warm and dry.  Psychiatric: She has a normal mood and affect. Her behavior is normal.  Nursing note and vitals reviewed.    ED Treatments / Results  DIAGNOSTIC STUDIES: Oxygen Saturation is 98% on RA, normal by my interpretation.   COORDINATION OF CARE: 11:00 AM- Will order labs and doppler study. Will X-Ray left knee, chest and order EKG. Pt verbalizes understanding and agrees to plan.  Medications - No data to display  Labs (all labs ordered are listed, but only abnormal results are displayed) Labs Reviewed  I-STAT BETA HCG BLOOD, ED ([facility], [facility], [facility] ONLY)  I-STAT TROPOININ, ED    EKG  EKG Interpretation  Date/Time:  Friday Dec 08 2016 11:17:16 EDT Ventricular Rate:  70 PR Interval:    QRS Duration: 92 QT Interval:  411 QTC Calculation: 444 R Axis:   83 Text Interpretation:  Sinus rhythm Borderline T abnormalities, anterior leads Baseline wander in lead(s) V6 since last tracing no significant change Confirmed by Daleen Bo [phone]) on 12/08/2016 11:32:39 AM Also confirmed by Daleen Bo [phone]), editor Drema Pry [phone])  on 12/08/2016 11:50:40 AM       Radiology Dg Chest 2 View  Result Date: 12/08/2016 CLINICAL DATA:  New left leg pain x 2 weeks with mid chest  discomfort intermittently also, states hx of dvt and pe's, nonsmoker, no other chest complaints EXAM: CHEST - 2 VIEW COMPARISON:  01/28/2015 FINDINGS: Relatively low lung volumes, with some crowding of bronchovascular structures in the bases. Lungs are clear. Heart size and mediastinal contours are within normal limits. No effusion. Visualized bones unremarkable. IMPRESSION: No acute cardiopulmonary disease. Electronically Signed   By: Lucrezia Europe M.D.   On: 12/08/2016 12:07   Dg Knee Complete 4 Views Left  Result Date: 12/08/2016 CLINICAL DATA:  New left leg pain x 2 weeks with mid chest discomfort intermittently, states hx of dvt and pe's, nonsmoker, no other chest complaints EXAM: LEFT KNEE - COMPLETE 4+ VIEW COMPARISON:  None. FINDINGS: No evidence of fracture, dislocation, or joint effusion. No evidence of arthropathy or other focal bone abnormality. Soft tissues are unremarkable. IMPRESSION: Negative. Electronically Signed  By: Lucrezia Europe M.D.   On: 12/08/2016 12:08    Procedures Procedures (including critical care time)  Medications Ordered in ED Medications - No data to display   Initial Impression / Assessment and Plan / ED Course  I have reviewed the triage vital signs and the nursing notes.  Pertinent labs & imaging results that were available during my care of the patient were reviewed by me and considered in my medical decision making (see chart for details).      Final Clinical Impressions(s) / ED Diagnoses   Final diagnoses:  Acute pain of left knee  Chest pain, unspecified type    New Prescriptions New Prescriptions   No medications on file    Labs: PT/INR, troponin  Imaging: Vascular ultrasound LLE, DG chest, DG left knee complete, EKG  Consults:  Therapeutics:  Discharge Meds:   Assessment/Plan: 46 year old female presents today with numerous complaints. Patient has a significant past medical history DVT, currently on Coumadin. She reports she's been  taking her medication as directed except for missing several doses last week. Patient has had no provoked an unprovoked DVTs. She has pain in the left lower extremity. This is likely secondary to a night of dancing, but she does report some swelling in the upper calf. Patient will have ultrasound here to rule out DVT. This is likely related to the knee joint, no signs of infectious etiology, no significant laxity. Plain films will be obtained.  Patient also having episodes of chest pain. This has been going on since her first diagnosis of DVT. Subsequent PE studies showed no significant findings. She does not have any shortness of breath, has reassuring vital signs. I have very low suspicion for PE in this patient. Patient notes brief episode of symptoms that are worse with movements of the chest and upper extremities, resolved on the round. She's had workups in the past with no significant findings, she has a low heart score, very low suspicion for ACS in this patient. Patient will have EKG, troponin, chest x-ray.  Patient's cardiac workup shows no significant findings that would indicate ACS, PE, or any other significant intrathoracic abnormality. Patient has a low heart score risk, she will be referred to cardiology for further evaluation of her recurring chest pain.  Patient's ultrasound shows no signs of DVT. Patient advised to any taking Coumadin as directed, follow-up with Dr. Burr Medico is scheduled.  Patient's plain films of her knee show no acute findings. Patient will be advised to rest ice apply compression and elevation. She'll follow-up with orthopedics if symptoms persist. Patient with no further questions or concerns, and strict precautions given. She verbalized understanding and agreement to today's plan.   I personally performed the services described in this documentation, which was scribed in my presence. The recorded information has been reviewed and is accurate.     Okey Regal,  PA-C 12/08/16 1252    Daleen Bo, MD 12/08/16 [phone]

## 2016-12-08 NOTE — ED Triage Notes (Signed)
Pt re[ports left leg [pain x 3 weeks, HX dvt . On Coumadin. Denies shortness of breath yet sts some chest "discomfort. No redness nor edema noted upon assessment. Pt limped to triage room. denies fall nor injury to leg .

## 2016-12-08 NOTE — Discharge Instructions (Signed)
Please read attached information. If you experience any new or worsening signs or symptoms please return to the emergency room for evaluation. Please follow-up with your primary care provider or specialist as discussed.  °

## 2016-12-14 ENCOUNTER — Other Ambulatory Visit: Payer: Self-pay | Admitting: Internal Medicine

## 2016-12-15 ENCOUNTER — Telehealth: Payer: Self-pay | Admitting: Family Medicine

## 2016-12-15 ENCOUNTER — Other Ambulatory Visit: Payer: Self-pay | Admitting: Internal Medicine

## 2016-12-15 MED ORDER — WARFARIN SODIUM 5 MG PO TABS: ORAL_TABLET | ORAL | 2 refills | Status: DC

## 2016-12-15 MED FILL — WARFARIN SODIUM 5 MG TABLET: 5 | 30 days supply | Qty: 60 | Fill #0

## 2016-12-15 NOTE — Telephone Encounter (Signed)
PT came to the office requesting a refill for warfarin (COUMADIN) 5 MG tablet   if you auth please to call her that way she can pick it up today since she is out. Please follow up

## 2016-12-15 NOTE — Telephone Encounter (Signed)
I refilled it yesterday but the pharmacy did not receive it. Will resend it now.

## 2016-12-19 ENCOUNTER — Encounter: Payer: Self-pay | Admitting: Pharmacy Technician

## 2016-12-19 NOTE — Progress Notes (Signed)
The patient is on Medicaid and drug assistance is closed.  Contact pharmacy if future assistance is needed.

## 2017-01-05 ENCOUNTER — Other Ambulatory Visit: Payer: Self-pay

## 2017-01-10 ENCOUNTER — Emergency Department (HOSPITAL_COMMUNITY)
Admission: EM | Admit: 2017-01-10 | Discharge: 2017-01-10 | Disposition: A | Discharge status: Home / Self Care | Payer: Medicaid Other | Attending: Emergency Medicine | Admitting: Emergency Medicine

## 2017-01-10 ENCOUNTER — Encounter (HOSPITAL_COMMUNITY): Payer: Self-pay | Admitting: Emergency Medicine

## 2017-01-10 DIAGNOSIS — Z7901 Long term (current) use of anticoagulants: Secondary | ICD-10-CM | POA: Insufficient documentation

## 2017-01-10 DIAGNOSIS — M7122 Synovial cyst of popliteal space [Baker], left knee: Secondary | ICD-10-CM | POA: Insufficient documentation

## 2017-01-10 NOTE — Discharge Instructions (Signed)
Continue ice, elevation, tylenol and rest.  Keep your scheduled appointment with the orthopedist.  Return to ER for new/worsening symptoms, any additional concerns.

## 2017-01-10 NOTE — ED Provider Notes (Signed)
[facility] DEPT Provider Note   CSN: [id number] Arrival date & time: 01/10/17  1720  By signing my name below, I, Donna [name], attest that this documentation has been prepared under the direction and in the presence of [facility], PA-C. Electronically Signed: Dora [name], Scribe. 01/10/2017. 6:32 PM.  History   Chief Complaint Chief Complaint  Patient presents with  . Cyst   The history is provided by the patient. No language interpreter was used.    HPI Comments: Donna [name] is a 46 y.o. female with PMHx including DVT on Coumadin with no missed doses who presents to the Emergency Department complaining of persistent posterior left knee pain for ~7 weeks. Her knee pain is worse with weight bearing and unrelieved by applying ice and taking acetaminophen. She was evaluated here on 5/25 for the same and had plain films of the knee which were negative. She also had an ultrasound of the LE to rule out DVT during that visit which revealed a complex baker's cyst in the left popliteal fossa - no DVT. Patient followed-up with orthopedics yesterday and was told she had fluid buildup in the back of the knee. She states her knee was manipulated by the orthopedist and her pain has been acutely worse since this time. An outpatient MRI was ordered and she is scheduled for another orthopedic visit in six days. Patient denies numbness/tingling or any other associated symptoms.  Past Medical History:  Diagnosis Date  . DVT (deep venous thrombosis) ([place]) 12/06/2012  . Lung collapse   . Pulmonary embolism, blood-clot, obstetric     Patient Active Problem List   Diagnosis Date Noted  . Personal history of venous thrombosis and embolism 08/01/2015  . Venous thrombosis 08/12/2014  . 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 Iron deficiency anemia 07/16/2014  . Pleuritic chest pain 07/16/2014  . High risk medication use 02/12/2013  . Chronic anticoagulation 02/12/2013  . Healthcare  maintenance 02/12/2013  . Obesity 12/30/2012  . Menorrhagia 12/16/2012  . Pulmonary embolism ([place]) 12/05/2012  . Anemia 12/05/2012    Past Surgical History:  Procedure Laterality Date  . RIB RESECTION      OB History    Gravida Para Term Preterm AB Living   1 1 1  0 0 1   SAB TAB Ectopic Multiple Live Births   0 0 0 0         Home Medications    Prior to Admission medications   Medication Sig Start Date End Date Taking? Authorizing Provider  ferrous sulfate 325 (65 FE) MG tablet One tablet orally twice daily with a meal 01/12/16   Truitt Merle, MD  Naproxen Sodium (ALEVE) 220 MG CAPS Take 220 mg by mouth every 12 (twelve) hours as needed (pain).    [provider]  warfarin (COUMADIN) 5 MG tablet TAKE 2 TABLETS BY MOUTH DAILY. AS DIRECTED BY COUMADIN CLINIC 12/15/16   Boykin Nearing, MD    Family History Family History  Problem Relation Age of Onset  . COPD Father   . Stroke Father     Social History Social History  Substance Use Topics  . Smoking status: Never Smoker  . Smokeless tobacco: Never Used  . Alcohol use Yes     Comment: socially      Allergies   Patient has no known allergies.   Review of Systems Review of Systems  Respiratory: Negative for shortness of breath.   Musculoskeletal: Positive for arthralgias.  Neurological: Negative for numbness.  Physical Exam Updated Vital Signs BP 113/75 (BP Location: Left Arm)   Pulse 98   Temp 98.1 F (36.7 C) (Oral)   Resp 20   Ht 5\' 10"  (1.778 m)   Wt 286 lb (129.7 kg)   LMP 12/31/2016   SpO2 97%   BMI 41.04 kg/m   Physical Exam  Constitutional: She appears well-developed and well-nourished. No distress.  HENT:  Head: Normocephalic and atraumatic.  Neck: Neck supple.  Cardiovascular: Normal rate, regular rhythm and normal heart sounds.   No murmur heard. Pulmonary/Chest: Effort normal and breath sounds normal. No respiratory distress. She has no wheezes. She has no rales.    Musculoskeletal: Normal range of motion.  Tenderness to palpation of left popliteal fossa with mild amount of swelling appreciated. No erythema/warmth. Full ROM. Ambulatory. Strength and sensation intact. 2+ DP bilaterally.  Neurological: She is alert.  Skin: Skin is warm and dry.  Nursing note and vitals reviewed.  ED Treatments / Results  Labs (all labs ordered are listed, but only abnormal results are displayed) Labs Reviewed - No data to display  EKG  EKG Interpretation None       Radiology No results found.  Procedures Procedures (including critical care time)  DIAGNOSTIC STUDIES: Oxygen Saturation is 97% on RA, normal by my interpretation.    COORDINATION OF CARE: 6:17 PM Discussed treatment plan with pt at bedside and pt agreed to plan.  Medications Ordered in ED Medications - No data to display   Initial Impression / Assessment and Plan / ED Course  I have reviewed the triage vital signs and the nursing notes.  Pertinent labs & imaging results that were available during my care of the patient were reviewed by me and considered in my medical decision making (see chart for details).    Donna [name] is a 46 y.o. female who presents to ED for recheck of left posterior knee pain. Patient was seen a few weeks ago in ED for same and chart thoroughly reviewed from that encounter. Patient does have hx of DVT on coumadin. At prior ER visit, DVT study was negative. X-ray of the knee also reassuring. She has not missed any doses in coumadin. No more swelling than she has experienced in the last month. No change in character/quality of pain. She followed up with ortho as instructed yesterday where she was told sxs are likely due to fluid behind the knee. MRI was ordered and she was scheduled for an appointment in 1 week to follow up, possibly to aspirate fluid. Patient presents today because she feels like pain is a little worse after that "pressed so much" on the area of  discomfort. She was hoping to go ahead and have fluid removed from the knee today. Discussed that this was something to be done by ortho at her scheduled appointment. Evaluation does not show pathology that would require ongoing emergent intervention or inpatient treatment. Encouraged her to keep scheduled appointment. Reasons to return to ER discussed and all questions answered.    Final Clinical Impressions(s) / ED Diagnoses   Final diagnoses:  Synovial cyst of left popliteal space    New Prescriptions Discharge Medication List as of 01/10/2017  6:18 PM     I personally performed the services described in this documentation, which was scribed in my presence. The recorded information has been reviewed and is accurate.    Ward, Ozella Almond, PA-C 01/10/17 2307    Margette Fast, MD 01/11/17 [phone]

## 2017-01-10 NOTE — ED Triage Notes (Signed)
Pt reports she came in a while back about a cyst behind her left knee and was told to come back if it persisted to get drained.  Pt ambulatory and A&O x4.

## 2017-01-18 ENCOUNTER — Ambulatory Visit: Payer: Self-pay | Attending: Family Medicine | Admitting: Pharmacist

## 2017-01-18 DIAGNOSIS — I2782 Chronic pulmonary embolism: Secondary | ICD-10-CM | POA: Insufficient documentation

## 2017-01-18 LAB — POCT INR: INR: 2.9

## 2017-01-18 MED FILL — ?WARFARIN SODIUM 5 MG TABLE: 5 | 30 days supply | Qty: 60 | Fill #1

## 2017-02-16 MED FILL — ?WARFARIN SODIUM 5 MG TABLE: 5 | 30 days supply | Qty: 60 | Fill #2

## 2017-02-22 NOTE — Addendum Note (Signed)
Addended by: Boykin Nearing on: 02/22/2017 01:05 PM   Modules accepted: Miquel Dunn

## 2017-03-08 ENCOUNTER — Ambulatory Visit: Payer: Self-pay | Attending: Internal Medicine | Admitting: Pharmacist

## 2017-03-08 DIAGNOSIS — Z86718 Personal history of other venous thrombosis and embolism: Secondary | ICD-10-CM | POA: Insufficient documentation

## 2017-03-08 DIAGNOSIS — Z7901 Long term (current) use of anticoagulants: Secondary | ICD-10-CM | POA: Insufficient documentation

## 2017-03-08 LAB — POCT INR: INR: 3

## 2017-03-09 ENCOUNTER — Other Ambulatory Visit (HOSPITAL_BASED_OUTPATIENT_CLINIC_OR_DEPARTMENT_OTHER): Payer: Self-pay

## 2017-03-09 DIAGNOSIS — D509 Iron deficiency anemia, unspecified: Secondary | ICD-10-CM

## 2017-03-09 LAB — IRON AND TIBC
%SAT: 29 % (ref 21–57)
IRON: 98 ug/dL (ref 41–142)
TIBC: 341 ug/dL (ref 236–444)
UIBC: 244 ug/dL (ref 120–384)

## 2017-03-09 LAB — CBC WITH DIFFERENTIAL/PLATELET
BASO%: 0.5 % (ref 0.0–2.0)
BASOS ABS: 0 10*3/uL (ref 0.0–0.1)
EOS%: 1.9 % (ref 0.0–7.0)
Eosinophils Absolute: 0.1 10*3/uL (ref 0.0–0.5)
HCT: 31.4 % — ABNORMAL LOW (ref 34.8–46.6)
HGB: 9.7 g/dL — ABNORMAL LOW (ref 11.6–15.9)
LYMPH%: 35.8 % (ref 14.0–49.7)
MCH: 22.5 pg — ABNORMAL LOW (ref 25.1–34.0)
MCHC: 30.9 g/dL — AB (ref 31.5–36.0)
MCV: 73 fL — ABNORMAL LOW (ref 79.5–101.0)
MONO#: 0.7 10*3/uL (ref 0.1–0.9)
MONO%: 11.2 % (ref 0.0–14.0)
NEUT#: 3.3 10*3/uL (ref 1.5–6.5)
NEUT%: 50.6 % (ref 38.4–76.8)
Platelets: 328 10*3/uL (ref 145–400)
RBC: 4.3 10*6/uL (ref 3.70–5.45)
RDW: 19.2 % — AB (ref 11.2–14.5)
WBC: 6.4 10*3/uL (ref 3.9–10.3)
lymph#: 2.3 10*3/uL (ref 0.9–3.3)

## 2017-03-09 LAB — FERRITIN: FERRITIN: 6 ng/mL — AB (ref 9–269)

## 2017-03-13 ENCOUNTER — Telehealth: Payer: Self-pay | Admitting: Hematology

## 2017-03-13 NOTE — Telephone Encounter (Signed)
lvm to inform pt of infusion appts 9/6 and 9/13 at 0930 per sch msg

## 2017-03-19 ENCOUNTER — Other Ambulatory Visit: Payer: Self-pay | Admitting: Family Medicine

## 2017-03-20 MED FILL — WARFARIN SODIUM 5 MG TABLET: 5 | 30 days supply | Qty: 60 | Fill #0

## 2017-03-22 ENCOUNTER — Ambulatory Visit (HOSPITAL_BASED_OUTPATIENT_CLINIC_OR_DEPARTMENT_OTHER): Payer: Self-pay

## 2017-03-22 VITALS — BP 113/60 | HR 87 | Temp 98.6°F | Resp 17

## 2017-03-22 DIAGNOSIS — D509 Iron deficiency anemia, unspecified: Secondary | ICD-10-CM

## 2017-03-22 MED ORDER — SODIUM CHLORIDE 0.9 % IV SOLN
510.0000 mg | Freq: Once | INTRAVENOUS | Status: AC
  Administered 2017-03-22: 510 mg via INTRAVENOUS
  Filled 2017-03-22: qty 17

## 2017-03-22 NOTE — Patient Instructions (Signed)

## 2017-03-29 ENCOUNTER — Ambulatory Visit (HOSPITAL_BASED_OUTPATIENT_CLINIC_OR_DEPARTMENT_OTHER): Payer: Self-pay

## 2017-03-29 VITALS — BP 126/56 | HR 84 | Temp 98.0°F | Resp 18

## 2017-03-29 DIAGNOSIS — D509 Iron deficiency anemia, unspecified: Secondary | ICD-10-CM

## 2017-03-29 MED ORDER — SODIUM CHLORIDE 0.9 % IV SOLN
510.0000 mg | Freq: Once | INTRAVENOUS | Status: AC
  Administered 2017-03-29: 510 mg via INTRAVENOUS
  Filled 2017-03-29: qty 17

## 2017-03-29 NOTE — Patient Instructions (Signed)

## 2017-04-19 MED FILL — WARFARIN SODIUM 5 MG TABLET: 5 | 30 days supply | Qty: 60 | Fill #1

## 2017-05-05 ENCOUNTER — Emergency Department (HOSPITAL_BASED_OUTPATIENT_CLINIC_OR_DEPARTMENT_OTHER)
Admission: EM | Admit: 2017-05-05 | Discharge: 2017-05-05 | Disposition: A | Discharge status: Home / Self Care | Payer: Self-pay | Attending: Emergency Medicine | Admitting: Emergency Medicine

## 2017-05-05 ENCOUNTER — Encounter (HOSPITAL_BASED_OUTPATIENT_CLINIC_OR_DEPARTMENT_OTHER): Payer: Self-pay

## 2017-05-05 DIAGNOSIS — Z7901 Long term (current) use of anticoagulants: Secondary | ICD-10-CM | POA: Insufficient documentation

## 2017-05-05 DIAGNOSIS — Z79899 Other long term (current) drug therapy: Secondary | ICD-10-CM | POA: Insufficient documentation

## 2017-05-05 DIAGNOSIS — N3001 Acute cystitis with hematuria: Secondary | ICD-10-CM | POA: Insufficient documentation

## 2017-05-05 LAB — URINALYSIS, ROUTINE W REFLEX MICROSCOPIC

## 2017-05-05 LAB — PREGNANCY, URINE: Preg Test, Ur: NEGATIVE

## 2017-05-05 LAB — URINALYSIS, MICROSCOPIC (REFLEX): RBC / HPF: NONE SEEN RBC/hpf (ref 0–5)

## 2017-05-05 MED ORDER — CEPHALEXIN 500 MG PO CAPS: 500.0000 mg | ORAL_CAPSULE | Freq: Two times a day (BID) | ORAL | 0 refills | Status: DC

## 2017-05-05 NOTE — ED Provider Notes (Signed)
[facility] EMERGENCY DEPARTMENT Provider Note   CSN: [id number] Arrival date & time: 05/05/17  1202     History   Chief Complaint Chief Complaint  Patient presents with  . Dysuria    [name] is a 46 y.o. female.  HPI   46 year old female with a history of DVT, PE, currently on warfarin presents today with complaints of dysuria.  Patient notes symptoms started approximately 3 days ago with burning with urination, frequent urination.  She notes minor pain down in her bladder, denies any nausea vomiting, fever, back pain, or any other comp gating features.  Patient notes she has been taking friend's antibiotics at home without significant improvement.  Patient has also been using over-the-counter AZO for pain.  Patient denies any other complaints here today.    Past Medical History:  Diagnosis Date  . DVT (deep venous thrombosis) ([place]) 12/06/2012  . Lung collapse   . Pulmonary embolism, blood-clot, obstetric     Patient Active Problem List   Diagnosis Date Noted  . Personal history of venous thrombosis and embolism 08/01/2015  . Venous thrombosis 08/12/2014  . 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 Iron deficiency anemia 07/16/2014  . Pleuritic chest pain 07/16/2014  . High risk medication use 02/12/2013  . Chronic anticoagulation 02/12/2013  . Healthcare maintenance 02/12/2013  . Obesity 12/30/2012  . Menorrhagia 12/16/2012  . Pulmonary embolism ([place]) 12/05/2012  . Anemia 12/05/2012    Past Surgical History:  Procedure Laterality Date  . RIB RESECTION      OB History    Gravida Para Term Preterm AB Living   1 1 1  0 0 1   SAB TAB Ectopic Multiple Live Births   0 0 0 0         Home Medications    Prior to Admission medications   Medication Sig Start Date End Date Taking? Authorizing Provider  ferrous sulfate 325 (65 FE) MG tablet One tablet orally twice daily with a meal 01/12/16  Yes Truitt Merle, MD  Naproxen Sodium (ALEVE)  220 MG CAPS Take 220 mg by mouth every 12 (twelve) hours as needed (pain).   Yes [provider]  warfarin (COUMADIN) 5 MG tablet TAKE 2 TABLETS BY MOUTH DAILY. AS DIRECTED BY COUMADIN CLINIC 03/20/17  Yes Jegede, Olugbemiga E, MD  cephALEXin (KEFLEX) 500 MG capsule Take 1 capsule (500 mg total) by mouth 2 (two) times daily. 05/05/17   Okey Regal, PA-C    Family History Family History  Problem Relation Age of Onset  . COPD Father   . Stroke Father     Social History Social History  Substance Use Topics  . Smoking status: Never Smoker  . Smokeless tobacco: Never Used  . Alcohol use Yes     Comment: socially      Allergies   Patient has no known allergies.   Review of Systems Review of Systems  All other systems reviewed and are negative.  Physical Exam Updated Vital Signs BP 118/78   Pulse 94   Temp 98.2 F (36.8 C) (Oral)   Resp 16   Ht 5\' 9"  (1.753 m)   Wt 114.3 kg (252 lb)   LMP 04/26/2017   SpO2 99%   BMI 37.21 kg/m    Physical Exam  Constitutional: She is oriented to person, place, and time. She appears well-developed and well-nourished.  HENT:  Head: Normocephalic and atraumatic.  Eyes: Pupils are equal, round, and reactive to light. Conjunctivae  are normal. Right eye exhibits no discharge. Left eye exhibits no discharge. No scleral icterus.  Neck: Normal range of motion. No JVD present. No tracheal deviation present.  Pulmonary/Chest: Effort normal. No stridor.  Abdominal: Soft. She exhibits no distension and no mass. There is tenderness. There is no rebound and no guarding. No hernia.  Minor suprapubic TTP - No CVA TTP  Neurological: She is alert and oriented to person, place, and time. Coordination normal.  Psychiatric: She has a normal mood and affect. Her behavior is normal. Judgment and thought content normal.  Nursing note and vitals reviewed.    ED Treatments / Results  Labs (all labs ordered are listed, but only abnormal results  are displayed) Labs Reviewed  URINALYSIS, ROUTINE W REFLEX MICROSCOPIC - Abnormal; Notable for the following:       Result Value   Color, Urine ORANGE (*)    APPearance TURBID (*)    Glucose, UA   (*)    Value: TEST NOT REPORTED DUE TO COLOR INTERFERENCE OF URINE PIGMENT   Hgb urine dipstick   (*)    Value: TEST NOT REPORTED DUE TO COLOR INTERFERENCE OF URINE PIGMENT   Bilirubin Urine   (*)    Value: TEST NOT REPORTED DUE TO COLOR INTERFERENCE OF URINE PIGMENT   Ketones, ur   (*)    Value: TEST NOT REPORTED DUE TO COLOR INTERFERENCE OF URINE PIGMENT   Protein, ur   (*)    Value: TEST NOT REPORTED DUE TO COLOR INTERFERENCE OF URINE PIGMENT   Nitrite   (*)    Value: TEST NOT REPORTED DUE TO COLOR INTERFERENCE OF URINE PIGMENT   Leukocytes, UA   (*)    Value: TEST NOT REPORTED DUE TO COLOR INTERFERENCE OF URINE PIGMENT   All other components within normal limits  URINALYSIS, MICROSCOPIC (REFLEX) - Abnormal; Notable for the following:    Bacteria, UA MANY (*)    Squamous Epithelial / LPF 6-30 (*)    All other components within normal limits  URINE CULTURE  PREGNANCY, URINE    EKG  EKG Interpretation None       Radiology No results found.  Procedures Procedures (including critical care time)  Medications Ordered in ED Medications - No data to display   Initial Impression / Assessment and Plan / ED Course  I have reviewed the triage vital signs and the nursing notes.  Pertinent labs & imaging results that were available during my care of the patient were reviewed by me and considered in my medical decision making (see chart for details).      Final Clinical Impressions(s) / ED Diagnoses   Final diagnoses:  Acute cystitis with hematuria    46 year old female presents today with likely urinary tract infection.  Patient be started on antibiotics as an outpatient, she will follow-up with her primary care if symptoms persist return if they worsen.  No findings  consistent with intra-abdominal pathology the require further evaluation or management here in the ED setting.  Patient verbalized understanding and agreement to today's plan.  Return precautions.  New Prescriptions Discharge Medication List as of 05/05/2017  2:24 PM    START taking these medications   Details  cephALEXin (KEFLEX) 500 MG capsule Take 1 capsule (500 mg total) by mouth 2 (two) times daily., Starting Sat 05/05/2017, Print         Mayco Walrond, [employer], PA-C 05/05/17 [place], Julie, MD 05/12/17 [phone]

## 2017-05-05 NOTE — ED Notes (Signed)
A lot vaginal discharge than usual per patient.

## 2017-05-05 NOTE — ED Triage Notes (Addendum)
Pt reports dysuria for 3-4 days, states feels similar to previous UTI's. Denies back pain, fever, or vomiting. Reports associated pelvic and rectal pain with vaginal discharge. Denies rash. Pt states she is taking Pyridium and "everybody's antibiotics" with no effect noted.

## 2017-05-05 NOTE — Discharge Instructions (Signed)
Please read attached information.  Please use antibiotics as directed.  Taking antibiotics with anticoagulation can increase your INR.  I highly encourage you to touch base with Coumadin clinic inform them of recent antibiotic use and need for follow-up evaluation and testing.  If you experience any new or worsening signs or symptoms please return to the emergency room immediately.

## 2017-05-05 NOTE — ED Notes (Signed)
ED Provider at bedside. 

## 2017-05-07 ENCOUNTER — Ambulatory Visit: Payer: Self-pay | Attending: Internal Medicine | Admitting: Pharmacist

## 2017-05-07 LAB — URINE CULTURE: Culture: 40000 — AB

## 2017-05-07 LAB — POCT INR: INR: 3.1

## 2017-05-07 NOTE — Progress Notes (Signed)
[facility] [phone]   [phone]  Follow up note    Name: DYNVER CLEMSON Date: 05/09/2017 MRN: [id number] DOB: 31-Jul-1970  PCP: Boykin Nearing, MD    CHIEF COMPLAIN: follow up recurrent DVT/PE, iron deficient anemia   HISTORY OF PRESENT ILLNESS:Riyanshi L Valdez is a 46 y.o. 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  Her DVT was thought to have been provoked by a leg spriain that happened a few days before her developing symptoms.  She had a hypercoagulable workup which was positive for lupus anticoagulant and decreased protein C levels.  She was discharge from the hospital on 5/28, and today she is inquiring whether she can come off anticoagulation.  She denies any prior DVTs or blood clots or family history of blood clots in the past.   She denies any melena or hematochezia or bleeding complications while on coumadin.  She has one child without a history of having miscarriages.  Due to recent subtherapeutic INR levels, she was started on 10 mg of coumadin on 06/03/2013.  Her INR on 05/28/2013 was 1.4 and on 06/02/2013 was 1.5.   She had repeat hypercoagulable workup on 06/05/2013 which revealed lupus anticoagulant was not detected.  She was negative for factor V mutation and prothrombin II gene mutation.  Her anticardiolipin were alo negative.  Antithrombin III levels were in normal range.      CURRENT THERAPY:  Coumadin, oral iron pills, IV feraheme as needed   INTERIM HISTORY:  Ms Depaulo returns for follow up. He was last seen by me 7 months ago. She had a blood transfusion twice in 03/2017. She presented to the ED on 05/05/17 for acute cystitis with hematuria. She presents to the clinic today with a friend. She has been doing better since her Infusion and she has restarted her iron pill that she takes 2 a day. She drinks orange juicee with pill.  She still takes coumadin due to her menorrhagia.  She uses holds it for a  couple days after period and that helps her a lot.      PAST MEDICAL HISTORY:  has a past medical history of DVT (deep venous thrombosis) ([place]) (12/06/2012); Lung collapse; and Pulmonary embolism, blood-clot, obstetric.     PAST SURGICAL HISTORY: Past Surgical History:  Procedure Laterality Date  . RIB RESECTION     CURRENT MEDICATIONS Current Outpatient Prescriptions on File Prior to Visit  Medication Sig Dispense Refill  . cephALEXin (KEFLEX) 500 MG capsule Take 1 capsule (500 mg total) by mouth 2 (two) times daily. 10 capsule 0  . ferrous sulfate 325 (65 FE) MG tablet One tablet orally twice daily with a meal 60 tablet 2  . warfarin (COUMADIN) 5 MG tablet TAKE 2 TABLETS BY MOUTH DAILY. AS DIRECTED BY COUMADIN CLINIC 60 tablet 2  . Naproxen Sodium (ALEVE) 220 MG CAPS Take 220 mg by mouth every 12 (twelve) hours as needed (pain).     No current facility-administered medications on file prior to visit.   ;  ALLERGIES: Patient has no known allergies.   SOCIAL HISTORY:  reports that she has never smoked. She has never used smokeless tobacco. She reports that she drinks alcohol. She reports that she does not use drugs.   FAMILY HISTORY: family history includes COPD in her father; Stroke in her father.   LABORATORY DATA:   CBC CBC Latest Ref Rng & Units 05/09/2017 03/09/2017 10/05/2016  WBC 3.9 -  10.3 10e3/uL 5.2 6.4 5.8  Hemoglobin 11.6 - 15.9 g/dL 12.1 9.7(L) 10.9(L)  Hematocrit 34.8 - 46.6 % 37.8 31.4(L) 35.2  Platelets 145 - 400 10e3/uL 339 328 282    CMP Latest Ref Rng & Units 05/09/2017 10/05/2016 07/22/2015  Glucose 70 - 140 mg/dl 92 97 93  BUN 7.0 - 26.0 mg/dL 11.6 10.8 8.2  Creatinine 0.6 - 1.1 mg/dL 0.8 0.9 0.9  Sodium 136 - 145 mEq/L 136 137 136  Potassium 3.5 - 5.1 mEq/L 4.0 3.9 3.8  Chloride 101 - 111 mmol/L - - -  CO2 22 - 29 mEq/L 19(L) 22 21(L)  Calcium 8.4 - 10.4 mg/dL 8.8 8.7 8.2(L)  Total Protein 6.4 - 8.3 g/dL 7.1 6.8 7.0  Total Bilirubin 0.20 - 1.20  mg/dL 0.22 0.26 <0.30  Alkaline Phos 40 - 150 U/L 69 70 62  AST 5 - 34 U/L 10 19 12   ALT 0 - 55 U/L 11 28 9     Results for GEM, CONKLE (MRN [id number]) as of 10/03/2016 16:00  Ref. Range 08/30/2015 14:42 10/08/2015 12:27 10/25/2015 12:43 12/20/2015 13:27 03/10/2016 13:59  Iron Latest Ref Range: 41 - 142 ug/dL 21 (L) 43 35 (L) 32 (L) 49  UIBC Latest Ref Range: 120 - 384 ug/dL 343 225 237 244 238  TIBC Latest Ref Range: 236 - 444 ug/dL 364 268 273 276 288  %SAT Latest Ref Range: 21 - 57 % 6 (L) 16 (L) 13 (L) 12 (L) 17 (L)  Ferritin Latest Ref Range: 9 - 269 ng/ml 5 (L) 89 36 24 29  PENDING: 05/09/17  REVIEW OF SYSTEMS:  Constitutional: Denies fevers, chills or abnormal weight loss Eyes: Denies blurriness of vision Ears, nose, mouth, throat, and face: Denies mucositis or sore throat Respiratory: Denies cough, dyspnea or wheezes Cardiovascular: Denies palpitation, chest discomfort or lower extremity swelling Gastrointestinal:  Denies nausea, heartburn or change in bowel habits Skin: Denies abnormal skin rashes Lymphatics: Denies new lymphadenopathy or easy bruising Neurological:Denies numbness, tingling or new weaknesses Behavioral/Psych: Mood is stable, no new changes  All other systems were reviewed with the patient and are negative.  PHYSICAL EXAM:  BP 112/71 (BP Location: Left Arm, Patient Position: Sitting)   Pulse 88   Temp 98 F (36.7 C) (Oral)   Resp 18   Ht 5\' 9"  (1.753 m)   Wt 282 lb 14.4 oz (128.3 kg)   LMP 04/26/2017   SpO2 98%   BMI 41.78 kg/m    Vitals:   05/09/17 1359  BP: 112/71  Pulse: 88  Resp: 18  Temp: 98 F (36.7 C)  TempSrc: Oral  SpO2: 98%  Weight: 282 lb 14.4 oz (128.3 kg)  Height: 5\' 9"  (1.753 m)    GENERAL:alert, no distress and comfortable; Moderately obese.  SKIN: skin color, texture, turgor are normal, no rashes or significant lesions EYES: normal, Conjunctiva are pink and non-injected, sclera clear OROPHARYNX:no exudate, no erythema  and lips, buccal mucosa, and tongue normal  NECK: supple, thyroid normal size, non-tender, without nodularity LYMPH:  no palpable lymphadenopathy in the cervical, axillary or inguinal LUNGS: clear to auscultation and percussion with normal breathing effort HEART: regular rate & rhythm and no murmurs and no lower extremity edema ABDOMEN:abdomen soft, non-tender and normal bowel sounds Musculoskeletal:no cyanosis of digits and no clubbing  NEURO: alert & oriented x 3 with fluent speech, no focal motor/sensory deficits  Radiology  No new scans    IMPRESSION AND PLAN: Quita Mcgrory Dupuis is a 46 y.o.  female with a history of   1. Anemia of iron deficiency, likely secondary to menorrhagia -She had fairly low ferritin and iron level, all consistent with iron deficiency. -She had good response to oral iron supplement in the past, but she is not very complaint, and has been receiving IV feraheme every 203 months. She responded well, anemia resolved afterwards. -She will continue Ferous sulfate 325mg  bid, she is tolerating well. -she will follow up with her GYN for menorrhagia -Her stool cards were negative for OB, we'll hold on colonoscopy for now. -IV Feraheme if ferritin less than 50 - I have advised the patient if period is heavy she can stop Coudamin for 2-3 days. It will help slow down bleeding. I encouraged her to stop the day she anticipates her cycle will come on and start back after cycle ends.  -She had iv feraheme in 03/2017  -Her Hg has resolved at 12.1. Today's iron study is pending.  -I encourage her to continue her oral iron, she is taking it twice daily.  -I explained what counts and in what range to watch in her labs. If low she knows to contact clinic if levels are lower she she feels significantly fatigued.  - Will continue to monitor. I again encouraged her to continue monitoring her lab in our office every 2 months to see if she needs IV iron. She agrees.  -f/u in 8 months    2.  Recurrent DVT/PE.  --She had provoked DVT and PE in 2014, and second episode off on provoked TE in December 2015 after she came off Coumadin. --Her PTT Lupus anticoagulant was 89.1 on 12/05/2012 and decreased Protein C levels.  Her repeated lupus anticoagulant is negative without detection of a lupus anticoagulant. Hypercoagulopathy workup was negative when she was off Coumadin. -I recommend anticoagulation for life long if there is no contraindication such as severe bleeding. -We discussed Different options of anticoagulation. She could not tolerate Xarelto. She had some skin thickening in the neck, questionable skin necrosis from coumadin, resolved now  -Preventive strategy such as avoid cigarette smoking, contraceptives, and aware compression stocks on [employer]. She Voiced Good Understanding. -she is tolerating Coumadin well, we'll continue and follow up with her PCP  -Due to her heavy menstrual period, OK to hold Coumadin for the first few days when she has menstrual period -She will continue coumadin.    3. Obesity. -- Continued dieting and exercises per her PCP.     Follow-up. -Continue coumadin and oral iron as explained.  -Lab every 2 months X4, iv iron if ferritin less than 50 -F/u in 8 months    This document serves as a record of services personally performed by Truitt Merle, MD. It was created on her behalf by Joslyn Devon, a trained medical scribe. The creation of this record is based on the scribe's personal observations and the provider's statements to them. This document has been checked and approved by the attending provider.   Truitt Merle, MD 05/09/2017

## 2017-05-07 NOTE — Addendum Note (Signed)
Addended by: Rica Mast on: 05/07/2017 04:48 PM   Modules accepted: Level of Service

## 2017-05-08 ENCOUNTER — Telehealth: Payer: Self-pay | Admitting: *Deleted

## 2017-05-08 NOTE — Telephone Encounter (Signed)
Post ED Visit - Positive Culture Follow-up  Culture report reviewed by antimicrobial stewardship pharmacist:  []  Donna [name], Pharm.D. []  Donna [name], Pharm.D., BCPS AQ-ID []  Donna [name], Pharm.D., BCPS [x]  Donna [name], Pharm.D., BCPS []  Donna [name], Pharm.D., BCPS, AAHIVP []  Donna [name], Pharm.D., BCPS, AAHIVP []  Donna [name], PharmD, BCPS []  Donna [name], PharmD, BCPS []  Donna [name], PharmD, BCPS  Positive urine culture Treated with Cephalexin, organism sensitive to the same and no further patient follow-up is required at this time.  Donna [name] 05/08/2017, 11:18 AM

## 2017-05-09 ENCOUNTER — Encounter: Payer: Self-pay | Admitting: Hematology

## 2017-05-09 ENCOUNTER — Ambulatory Visit (HOSPITAL_BASED_OUTPATIENT_CLINIC_OR_DEPARTMENT_OTHER): Payer: Self-pay | Admitting: Hematology

## 2017-05-09 ENCOUNTER — Other Ambulatory Visit (HOSPITAL_BASED_OUTPATIENT_CLINIC_OR_DEPARTMENT_OTHER): Payer: Self-pay

## 2017-05-09 VITALS — BP 112/71 | HR 88 | Temp 98.0°F | Resp 18 | Ht 69.0 in | Wt 282.9 lb

## 2017-05-09 DIAGNOSIS — D509 Iron deficiency anemia, unspecified: Secondary | ICD-10-CM

## 2017-05-09 DIAGNOSIS — I8290 Acute embolism and thrombosis of unspecified vein: Secondary | ICD-10-CM

## 2017-05-09 DIAGNOSIS — E669 Obesity, unspecified: Secondary | ICD-10-CM

## 2017-05-09 LAB — COMPREHENSIVE METABOLIC PANEL
ALBUMIN: 2.7 g/dL — AB (ref 3.5–5.0)
ALK PHOS: 69 U/L (ref 40–150)
ALT: 11 U/L (ref 0–55)
AST: 10 U/L (ref 5–34)
Anion Gap: 8 mEq/L (ref 3–11)
BILIRUBIN TOTAL: 0.22 mg/dL (ref 0.20–1.20)
BUN: 11.6 mg/dL (ref 7.0–26.0)
CO2: 19 mEq/L — ABNORMAL LOW (ref 22–29)
CREATININE: 0.8 mg/dL (ref 0.6–1.1)
Calcium: 8.8 mg/dL (ref 8.4–10.4)
Chloride: 109 mEq/L (ref 98–109)
EGFR: >60 mL/min/{1.73_m2} (ref >60)
GLUCOSE: 92 mg/dL (ref 70–140)
Potassium: 4 mEq/L (ref 3.5–5.1)
SODIUM: 136 meq/L (ref 136–145)
TOTAL PROTEIN: 7.1 g/dL (ref 6.4–8.3)

## 2017-05-09 LAB — CBC WITH DIFFERENTIAL/PLATELET
BASO%: 0.8 % (ref 0.0–2.0)
Basophils Absolute: 0 10*3/uL (ref 0.0–0.1)
EOS%: 1.7 % (ref 0.0–7.0)
Eosinophils Absolute: 0.1 10*3/uL (ref 0.0–0.5)
HEMATOCRIT: 37.8 % (ref 34.8–46.6)
HEMOGLOBIN: 12.1 g/dL (ref 11.6–15.9)
LYMPH#: 1.6 10*3/uL (ref 0.9–3.3)
LYMPH%: 31.2 % (ref 14.0–49.7)
MCH: 26.1 pg (ref 25.1–34.0)
MCHC: 32.1 g/dL (ref 31.5–36.0)
MCV: 81.1 fL (ref 79.5–101.0)
MONO#: 0.5 10*3/uL (ref 0.1–0.9)
MONO%: 9.5 % (ref 0.0–14.0)
NEUT%: 56.8 % (ref 38.4–76.8)
NEUTROS ABS: 2.9 10*3/uL (ref 1.5–6.5)
PLATELETS: 339 10*3/uL (ref 145–400)
RBC: 4.66 10*6/uL (ref 3.70–5.45)
RDW: 28.3 % — AB (ref 11.2–14.5)
WBC: 5.2 10*3/uL (ref 3.9–10.3)

## 2017-05-09 LAB — IRON AND TIBC
%SAT: 12 % — ABNORMAL LOW (ref 21–57)
Iron: 27 ug/dL — ABNORMAL LOW (ref 41–142)
TIBC: 226 ug/dL — ABNORMAL LOW (ref 236–444)
UIBC: 199 ug/dL (ref 120–384)

## 2017-05-09 LAB — FERRITIN: Ferritin: 70 ng/ml (ref 9–269)

## 2017-05-10 ENCOUNTER — Ambulatory Visit: Payer: Self-pay | Attending: Internal Medicine | Admitting: Pharmacist

## 2017-05-10 DIAGNOSIS — Z7901 Long term (current) use of anticoagulants: Secondary | ICD-10-CM | POA: Insufficient documentation

## 2017-05-10 LAB — POCT INR: INR: 2.2

## 2017-05-11 ENCOUNTER — Telehealth: Payer: Self-pay | Admitting: Hematology

## 2017-05-11 NOTE — Telephone Encounter (Signed)
Spoke with patient regarding appt added per 10/24 los.

## 2017-05-23 MED FILL — WARFARIN SODIUM 5 MG TABLET: 5 | 30 days supply | Qty: 60 | Fill #2

## 2017-06-06 ENCOUNTER — Ambulatory Visit: Payer: Self-pay | Attending: Internal Medicine | Admitting: Pharmacist

## 2017-06-06 DIAGNOSIS — Z7901 Long term (current) use of anticoagulants: Secondary | ICD-10-CM | POA: Insufficient documentation

## 2017-06-06 DIAGNOSIS — Z86718 Personal history of other venous thrombosis and embolism: Secondary | ICD-10-CM | POA: Insufficient documentation

## 2017-06-06 DIAGNOSIS — D500 Iron deficiency anemia secondary to blood loss (chronic): Secondary | ICD-10-CM | POA: Insufficient documentation

## 2017-06-06 LAB — POCT INR: INR: 2.3

## 2017-07-02 ENCOUNTER — Other Ambulatory Visit: Payer: Self-pay | Admitting: Internal Medicine

## 2017-07-02 MED FILL — WARFARIN SODIUM 5 MG TABLET: 5 | 30 days supply | Qty: 60 | Fill #0

## 2017-07-11 ENCOUNTER — Other Ambulatory Visit (HOSPITAL_BASED_OUTPATIENT_CLINIC_OR_DEPARTMENT_OTHER): Payer: Self-pay

## 2017-07-11 DIAGNOSIS — D509 Iron deficiency anemia, unspecified: Secondary | ICD-10-CM

## 2017-07-11 LAB — CBC WITH DIFFERENTIAL/PLATELET
BASO%: 0.4 % (ref 0.0–2.0)
BASOS ABS: 0 10*3/uL (ref 0.0–0.1)
EOS%: 2 % (ref 0.0–7.0)
Eosinophils Absolute: 0.1 10*3/uL (ref 0.0–0.5)
HCT: 38.5 % (ref 34.8–46.6)
HGB: 12.3 g/dL (ref 11.6–15.9)
LYMPH%: 44.3 % (ref 14.0–49.7)
MCH: 26.9 pg (ref 25.1–34.0)
MCHC: 31.9 g/dL (ref 31.5–36.0)
MCV: 84.2 fL (ref 79.5–101.0)
MONO#: 0.4 10*3/uL (ref 0.1–0.9)
MONO%: 8.5 % (ref 0.0–14.0)
NEUT#: 2.1 10*3/uL (ref 1.5–6.5)
NEUT%: 44.8 % (ref 38.4–76.8)
Platelets: 249 10*3/uL (ref 145–400)
RBC: 4.57 10*6/uL (ref 3.70–5.45)
RDW: 16.7 % — ABNORMAL HIGH (ref 11.2–14.5)
WBC: 4.6 10*3/uL (ref 3.9–10.3)
lymph#: 2 10*3/uL (ref 0.9–3.3)

## 2017-07-11 LAB — IRON AND TIBC
%SAT: 16 % — ABNORMAL LOW (ref 21–57)
IRON: 52 ug/dL (ref 41–142)
TIBC: 314 ug/dL (ref 236–444)
UIBC: 262 ug/dL (ref 120–384)

## 2017-07-11 LAB — FERRITIN: Ferritin: 20 ng/ml (ref 9–269)

## 2017-07-16 ENCOUNTER — Telehealth: Payer: Self-pay | Admitting: *Deleted

## 2017-07-16 NOTE — Telephone Encounter (Signed)
-----   Message from Truitt Merle, MD sent at 07/14/2017  1:10 PM EST ----- Let pt know her iron level is slightly low, I will set up iv fereheme once. Thanks  Truitt Merle  07/14/2017

## 2017-07-16 NOTE — Telephone Encounter (Signed)
TCT patient regarding lab results from 07/11/17. Spoke with patient and informed her that her iron remains  Slightly low and that Dr. Burr Medico will set up a time for IV fereheme. Pt voiced understanding.  No other questions or concerns.

## 2017-07-19 ENCOUNTER — Telehealth: Payer: Self-pay | Admitting: Hematology

## 2017-07-19 NOTE — Telephone Encounter (Signed)
Scheduled appt per 12/29 sch message - Patient is aware of appt date  And time.

## 2017-07-20 ENCOUNTER — Other Ambulatory Visit: Payer: Self-pay | Admitting: *Deleted

## 2017-07-20 ENCOUNTER — Other Ambulatory Visit: Payer: Self-pay | Admitting: Hematology

## 2017-07-21 ENCOUNTER — Ambulatory Visit (HOSPITAL_BASED_OUTPATIENT_CLINIC_OR_DEPARTMENT_OTHER): Payer: Self-pay

## 2017-07-21 VITALS — BP 124/72 | HR 82 | Temp 98.1°F | Resp 17

## 2017-07-21 DIAGNOSIS — D509 Iron deficiency anemia, unspecified: Secondary | ICD-10-CM

## 2017-07-21 MED ORDER — SODIUM CHLORIDE 0.9 % IV SOLN
510.0000 mg | Freq: Once | INTRAVENOUS | Status: AC
  Administered 2017-07-21: 510 mg via INTRAVENOUS
  Filled 2017-07-21: qty 17

## 2017-07-21 MED ORDER — SODIUM CHLORIDE 0.9 % IJ SOLN
10.0000 mL | INTRAMUSCULAR | Status: DC | PRN
  Filled 2017-07-21: qty 10

## 2017-07-21 MED ORDER — SODIUM CHLORIDE 0.9 % IV SOLN
Freq: Once | INTRAVENOUS | Status: AC
  Administered 2017-07-21: 10:00:00 via INTRAVENOUS

## 2017-07-21 NOTE — Patient Instructions (Signed)

## 2017-07-31 MED FILL — WARFARIN SODIUM 5 MG TABLET: 5 | 30 days supply | Qty: 60 | Fill #1

## 2017-08-02 ENCOUNTER — Ambulatory Visit: Payer: Self-pay | Attending: Internal Medicine | Admitting: Pharmacist

## 2017-08-02 DIAGNOSIS — Z5181 Encounter for therapeutic drug level monitoring: Secondary | ICD-10-CM | POA: Insufficient documentation

## 2017-08-02 DIAGNOSIS — Z7901 Long term (current) use of anticoagulants: Secondary | ICD-10-CM | POA: Insufficient documentation

## 2017-08-02 LAB — POCT INR: INR: 1.7

## 2017-08-09 ENCOUNTER — Ambulatory Visit: Payer: Self-pay | Attending: Internal Medicine | Admitting: Pharmacist

## 2017-08-09 DIAGNOSIS — Z7901 Long term (current) use of anticoagulants: Secondary | ICD-10-CM | POA: Insufficient documentation

## 2017-08-09 DIAGNOSIS — Z86718 Personal history of other venous thrombosis and embolism: Secondary | ICD-10-CM | POA: Insufficient documentation

## 2017-08-09 LAB — POCT INR: INR: 2.1

## 2017-09-06 MED FILL — WARFARIN SODIUM 5 MG TABLET: 5 | 30 days supply | Qty: 60 | Fill #2

## 2017-09-11 ENCOUNTER — Encounter: Payer: Self-pay | Admitting: Family Medicine

## 2017-09-11 ENCOUNTER — Ambulatory Visit: Payer: Self-pay | Attending: Family Medicine | Admitting: Family Medicine

## 2017-09-11 ENCOUNTER — Ambulatory Visit: Payer: Medicaid Other | Attending: Family Medicine | Admitting: Pharmacist

## 2017-09-11 VITALS — BP 118/84 | HR 101 | Temp 98.3°F | Ht 69.0 in | Wt 292.0 lb

## 2017-09-11 DIAGNOSIS — Z86711 Personal history of pulmonary embolism: Secondary | ICD-10-CM | POA: Insufficient documentation

## 2017-09-11 DIAGNOSIS — Z79899 Other long term (current) drug therapy: Secondary | ICD-10-CM | POA: Insufficient documentation

## 2017-09-11 DIAGNOSIS — Z791 Long term (current) use of non-steroidal anti-inflammatories (NSAID): Secondary | ICD-10-CM | POA: Insufficient documentation

## 2017-09-11 DIAGNOSIS — Z9889 Other specified postprocedural states: Secondary | ICD-10-CM | POA: Insufficient documentation

## 2017-09-11 DIAGNOSIS — D6859 Other primary thrombophilia: Secondary | ICD-10-CM

## 2017-09-11 DIAGNOSIS — Z7901 Long term (current) use of anticoagulants: Secondary | ICD-10-CM | POA: Insufficient documentation

## 2017-09-11 DIAGNOSIS — Z86718 Personal history of other venous thrombosis and embolism: Secondary | ICD-10-CM

## 2017-09-11 LAB — POCT INR: INR: 1.1

## 2017-09-11 NOTE — Patient Instructions (Signed)
Warfarin Coagulopathy Warfarin (Coumadin) coagulopathy refers to bleeding that may occur as a complication of the medicine warfarin. Warfarin is an oral blood thinner (anticoagulant). Warfarin is used for medical conditions where thinning of the blood is needed to prevent blood clots. What are the causes? Bleeding is the most common and most serious complication of warfarin. The amount of bleeding is related to the warfarin dose and length of treatment. In addition, bleeding complications can also occur due to:  Intentional or accidental warfarin overdose.  Underlying medical conditions.  Dietary changes.  Medicine, herbal, supplement, or alcohol interactions.  What are the signs or symptoms? Severe bleeding while on warfarin may occur from any tissue or organ. Symptoms of the blood being too thin may include:  Bleeding from the nose or gums.  Blood in bowel movements which may appear as bright red, dark, or black tarry stools.  Blood in the urine which may appear as pink, red, or brown urine.  Unusual bruising or bruising easily.  A cut that does not stop bleeding within 10 minutes.  Vomiting blood or continuous nausea for more than 1 day.  Coughing up blood.  Broken blood vessels in your eye (subconjunctival hemorrhage).  Abdominal or back pain with or without flank bruising.  Sudden, severe headache.  Sudden weakness or numbness of the face, arm, or leg, especially on one side of the body.  Sudden confusion.  Trouble speaking (aphasia) or understanding.  Sudden trouble seeing in one or both eyes.  Sudden trouble walking.  Dizziness.  Loss of balance or coordination.  Vaginal bleeding.  Swelling or pain at an injection site.  Superficial fat tissue death (necrosis) which may cause skin scarring. This is more common in women and may first present as pain in the waist, thighs, or buttocks.  Follow these instructions at home:  Always contact your health care  provider of any concerns or signs of possible warfarin coagulopathy as soon as possible.  Take warfarin exactly as directed by your health care provider. It is recommended that you take your warfarin dose at the same time of the day. If you have been told to stop taking warfarin, do not resume taking warfarin until directed to do so by your health care provider. Follow your health care provider's instructions if you accidentally take an extra dose or miss a dose of warfarin. It is very important to take warfarin as directed since bleeding or blood clots could result in chronic or permanent injury, pain, or disability.  Keep all follow-up appointments with your health care provider as directed. It is very important to keep your appointments. Not keeping appointments could result in a chronic or permanent injury, pain, or disability because warfarin is a medicine that requires close monitoring.  While taking warfarin, you will need to have regular blood tests to measure your blood clotting time. These blood tests usually include both the prothrombin time (PT) and International Normalized Ratio (INR) tests. The PT and INR results allow your health care provider to adjust your dose of warfarin. The dose can change for many reasons. It is critically important that you have your PT and INR levels drawn exactly as directed. Your warfarin dose may stay the same or change depending on what the PT and INR results are. Be sure to follow up with your health care provider regarding your PT and INR test results and what your warfarin dosage should be.  Many medicines can interfere with warfarin and affect the PT and INR   results. You must tell your health care provider about any and all medicines you take. This includes all vitamins and supplements. Ask your health care provider before taking these. Prescription and over-the-counter medicine consistency is critical to warfarin management. It is important that potential  interactions are checked before you start a new medicine. Be especially cautious with aspirin and anti-inflammatory medicines. Ask your health care provider before taking these. Medicines such as antibiotics and acid-reducing medicine can interact with warfarin and can cause an increased warfarin effect. Warfarin can also interfere with the effectiveness of medicines you are taking. Do not take or discontinue any prescribed or over-the-counter medicine except on the advice of your health care provider or pharmacist.  Some vitamins, supplements, and herbal products interfere with the effectiveness of warfarin. Vitamin E may increase the anticoagulant effects of warfarin. Vitamin K can cause warfarin to be less effective. Do not take or discontinue any vitamin, supplement, or herbal product except on the advice of your health care provider or pharmacist.  Eat what you normally eat and keep the vitamin K content of your diet consistent. Avoid major changes in your diet, or notify your health care provider before changing your diet. Suddenly getting a lot more vitamin K could cause your blood to clot too quickly. A sudden decrease in vitamin K intake could cause your blood to clot too slowly. These changes in vitamin K intake could lead to dangerous blood clotsor to bleeding. To keep your vitamin K intake consistent, you must be aware of which foods contain moderate or high amounts of vitamin K. Some foods that are high in vitamin K include spinach, kale, broccoli, cabbage, greens, Brussels sprouts, asparagus, bok choy, coleslaw, and parsley. If you drink green tea, drink the same amount each day. Arrange a visit with a dietitian to answer your questions.  If you have a loss of appetite or get the stomach flu (viral gastroenteritis), talk to your health care provider as soon as possible. A decrease in your normal vitamin K intake can make you more sensitive to your usual dose of warfarin.  Some medical  conditions may increase your risk for bleeding while you are taking warfarin. A fever, diarrhea lasting more than a day, worsening heart failure, or worsening liver function are some medical conditions that could affect warfarin. Contact your health care provider if you have any of these medical conditions.  Be careful not to cut yourself when using sharp objects or while shaving.  Alcohol can change the body's ability to handle warfarin. It is best to avoid alcoholic drinks or consume only very small amounts while taking warfarin. Notify your health care provider if you change your alcohol intake. A sudden increase in alcohol use can increase your risk of bleeding. Chronic alcohol use can cause warfarin to be less effective.  Limit physical activities or sports that could result in a fall or cause injury.  Do not use warfarin if you are pregnant.  Inform all your health care providers and your dentist that you take warfarin.  Inform all health care providers if you are taking warfarin and aspirin or platelet inhibitor medicines such as clopidogrel, ticagrelor, or prasugrel. Use of these medicines in addition to warfarin can increase your risk of bleeding or death. Taking these medicines together should only be done under the direct care of your health care providers. Get help right away if:  You cough up blood.  You have dark or black stools or there is bright red   blood coming from your rectum.  You vomit blood or have nausea for more than 1 day.  You have blood in the urine or pink-colored urine.  You have unusual bruising or have increased bruising.  You have bleeding from the nose or gums that does not stop quickly.  You have a cut that does not stop bleeding within 2-3 minutes.  You have sudden weakness or numbness of the face, arm, or leg, especially on one side of the body.  You have sudden confusion.  You have trouble speaking (aphasia) or understanding.  You have sudden  trouble seeing in one or both eyes.  You have sudden trouble walking.  You have dizziness.  You have a loss of balance or coordination.  You have a sudden, severe headache.  You have a serious fall or head injury, even if you are not bleeding.  You have swelling or pain at an injection site.  You have unexplained tenderness or pain in the abdomen, back, waist, thighs, or buttocks. Any of these symptoms may represent a serious problem that is an emergency. Do not wait to see if the symptoms will go away. Get medical help right away. Call your local emergency services (911 in [place] Do not drive yourself to the hospital. This information is not intended to replace advice given to you by your health care provider. Make sure you discuss any questions you have with your health care provider. Document Released: 06/11/2006 Document Revised: 12/09/2015 Document Reviewed: 12/12/2011 Elsevier Interactive Patient Education  2018 [employer]  

## 2017-09-11 NOTE — Progress Notes (Signed)
Subjective:  Patient ID: Donna [name], female    DOB: [birth year]  Age: 47 y.o. 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 Her INR is being monitored by the Clinical Pharmacist and she denies bruising or bleeding. She is yet to have an annual physical exam.  Past Medical History:  Diagnosis Date  . DVT (deep venous thrombosis) ([place]) 12/06/2012  . Lung collapse   . Pulmonary embolism, blood-clot, obstetric     Past Surgical History:  Procedure Laterality Date  . RIB RESECTION      No Known Allergies  Outpatient Medications Prior to Visit  Medication Sig Dispense Refill  . ferrous sulfate 325 (65 FE) MG tablet One tablet orally twice daily with a meal 60 tablet 2  . warfarin (COUMADIN) 5 MG tablet TAKE 2 TABLETS BY MOUTH DAILY. AS DIRECTED BY COUMADIN CLINIC 60 tablet 2  . Naproxen Sodium (ALEVE) 220 MG CAPS Take 220 mg by mouth every 12 (twelve) hours as needed (pain).    . cephALEXin (KEFLEX) 500 MG capsule Take 1 capsule (500 mg total) by mouth 2 (two) times daily. 10 capsule 0   No facility-administered medications prior to visit.     ROS Review of Systems  Constitutional: Negative for activity change, appetite change and fatigue.  HENT: Negative for congestion, sinus pressure and sore throat.   Eyes: Negative for visual disturbance.  Respiratory: Negative for cough, chest tightness, shortness of breath and wheezing.   Cardiovascular: Negative for chest pain and palpitations.  Gastrointestinal: Negative for abdominal distention, abdominal pain and constipation.  Endocrine: Negative for polydipsia.  Genitourinary: Negative for dysuria and frequency.    Musculoskeletal: Negative for arthralgias and back pain.  Skin: Negative for rash.  Neurological: Negative for tremors, light-headedness and numbness.  Hematological: Does not bruise/bleed easily.  Psychiatric/Behavioral: Negative for agitation and behavioral problems.    Objective:  BP 118/84   Pulse (!) 101   Temp 98.3 F (36.8 C) (Oral)   Ht 5\' 9"  (1.753 m)   Wt 292 lb (132.5 kg)   SpO2 95%   BMI 43.12 kg/m   BP/Weight 09/11/2017 07/21/2017 17/51/0258  Systolic BP 527 782 423  Diastolic BP 84 72 71  Wt. (Lbs) 292 - 282.9  BMI 43.12 - 41.78      Physical Exam  Constitutional: She is oriented to person, place, and time. She appears well-developed and well-nourished.  Cardiovascular: Normal rate, normal heart sounds and intact distal pulses.  No murmur heard. Pulmonary/Chest: Effort normal and breath sounds normal. She has no wheezes. She has no rales. She exhibits no tenderness.  Abdominal: Soft. Bowel sounds are normal. She exhibits no distension and no mass. There is no tenderness.  Musculoskeletal: Normal range of motion.  Neurological: She is alert and oriented to person, place, and time.     Assessment & Plan:   1. Personal history of venous thrombosis and embolism INR is 1.1 Previously unable to tolerate Xarelto Coumadin adjusted by Clinical pharmacist today  2. Hypercoagulable state ([place]) Protein C deficiency and positive lupus anticoagulant , subsequent hypercoagulable work up was negative Will remain on lifelong anticoagulation as per hematology   No orders of the defined types were placed in this  encounter.   Follow-up: Return in about 1 month (around 10/09/2017) for complete physical exam.   Charlott Rakes MD

## 2017-09-12 ENCOUNTER — Inpatient Hospital Stay: Payer: Medicaid Other | Attending: Hematology

## 2017-09-12 DIAGNOSIS — D509 Iron deficiency anemia, unspecified: Secondary | ICD-10-CM

## 2017-09-12 LAB — CBC WITH DIFFERENTIAL/PLATELET
BASOS ABS: 0 10*3/uL (ref 0.0–0.1)
Basophils Relative: 1 %
EOS ABS: 0.1 10*3/uL (ref 0.0–0.5)
EOS PCT: 3 %
HCT: 39.9 % (ref 34.8–46.6)
HEMOGLOBIN: 13.1 g/dL (ref 11.6–15.9)
LYMPHS ABS: 2.2 10*3/uL (ref 0.9–3.3)
LYMPHS PCT: 46 %
MCH: 28.7 pg (ref 25.1–34.0)
MCHC: 32.8 g/dL (ref 31.5–36.0)
MCV: 87.5 fL (ref 79.5–101.0)
Monocytes Absolute: 0.5 10*3/uL (ref 0.1–0.9)
Monocytes Relative: 11 %
NEUTROS PCT: 39 %
Neutro Abs: 1.8 10*3/uL (ref 1.5–6.5)
PLATELETS: 294 10*3/uL (ref 145–400)
RBC: 4.56 MIL/uL (ref 3.70–5.45)
RDW: 18 % — ABNORMAL HIGH (ref 11.2–14.5)
WBC: 4.6 10*3/uL (ref 3.9–10.3)

## 2017-09-12 LAB — IRON AND TIBC
IRON: 35 ug/dL — AB (ref 41–142)
SATURATION RATIOS: 13 % — AB (ref 21–57)
TIBC: 281 ug/dL (ref 236–444)
UIBC: 246 ug/dL

## 2017-09-12 LAB — FERRITIN: FERRITIN: 28 ng/mL (ref 9–269)

## 2017-09-13 ENCOUNTER — Telehealth: Payer: Self-pay | Admitting: *Deleted

## 2017-09-13 ENCOUNTER — Telehealth: Payer: Self-pay | Admitting: Hematology and Oncology

## 2017-09-13 NOTE — Telephone Encounter (Signed)
TCT patient regarding labs results from  09/12/17. Spoke with patient and informed her that her iron levels were slightly low and that Dr. Burr Medico has scheduled her to receive IV iron on 09/20/17 @ 7:30 am.   Pt verbalized understanding. She has had IV iron before and had no questions or concerns. She continues to take her oral iron.

## 2017-09-13 NOTE — Telephone Encounter (Signed)
Spoke with patient regarding appointment per 2/28 sch msg

## 2017-09-18 ENCOUNTER — Ambulatory Visit: Payer: Medicaid Other | Attending: Family Medicine | Admitting: Pharmacist

## 2017-09-18 DIAGNOSIS — Z7901 Long term (current) use of anticoagulants: Secondary | ICD-10-CM | POA: Insufficient documentation

## 2017-09-18 LAB — POCT INR: INR: 2.5

## 2017-09-20 ENCOUNTER — Encounter: Payer: Self-pay | Admitting: Pharmacy Technician

## 2017-09-20 ENCOUNTER — Inpatient Hospital Stay: Payer: Self-pay | Attending: Hematology

## 2017-09-20 VITALS — BP 100/73 | HR 87 | Temp 98.2°F | Resp 18

## 2017-09-20 DIAGNOSIS — D509 Iron deficiency anemia, unspecified: Secondary | ICD-10-CM | POA: Insufficient documentation

## 2017-09-20 MED ORDER — FERUMOXYTOL INJECTION 510 MG/17 ML
510.0000 mg | Freq: Once | INTRAVENOUS | Status: AC
  Administered 2017-09-20: 510 mg via INTRAVENOUS
  Filled 2017-09-20: qty 17

## 2017-09-20 NOTE — Progress Notes (Signed)
PT stayed for 30 min post observation period.  Tolerated well.  VS stable.  A&Ox4 and ambulatory.

## 2017-09-20 NOTE — Progress Notes (Signed)
The patient's Medicaid insurance termed on 07/17/17. Drug assistance enrollment for Feraheme covers 10/30/16 - 10/30/17. The DOS 09/20/17 is covered by drug assistance. The patient signed an application for re enrollment since drug assistance will end 10/30/17.  The patient stated that she has not re applied for Medicaid.

## 2017-09-20 NOTE — Patient Instructions (Signed)

## 2017-09-21 ENCOUNTER — Encounter: Payer: Self-pay | Admitting: Pharmacy Technician

## 2017-09-21 NOTE — Progress Notes (Signed)
The patient has been re enrolled for drug assistance with [employer] for Feraheme. Enrollment is from 09/20/17 - 09/20/18 and is based on Self Pay.  Drug assistance will replace drug for DOS 07/21/17 and 09/20/17.

## 2017-10-02 ENCOUNTER — Ambulatory Visit: Payer: Self-pay | Attending: Family Medicine | Admitting: Pharmacist

## 2017-10-02 DIAGNOSIS — I2782 Chronic pulmonary embolism: Secondary | ICD-10-CM | POA: Insufficient documentation

## 2017-10-02 LAB — POCT INR: INR: 2.2

## 2017-10-04 ENCOUNTER — Other Ambulatory Visit: Payer: Self-pay | Admitting: Internal Medicine

## 2017-10-04 MED FILL — WARFARIN SODIUM 5 MG TABLET: 5 | 30 days supply | Qty: 60 | Fill #0

## 2017-10-15 ENCOUNTER — Encounter: Payer: Self-pay | Admitting: Family Medicine

## 2017-10-15 ENCOUNTER — Ambulatory Visit: Payer: Medicaid Other | Attending: Family Medicine | Admitting: Family Medicine

## 2017-10-15 ENCOUNTER — Other Ambulatory Visit (HOSPITAL_COMMUNITY)
Admission: RE | Admit: 2017-10-15 | Discharge: 2017-10-15 | Disposition: A | Discharge status: Home / Self Care | Payer: Medicaid Other | Source: Ambulatory Visit | Attending: Family Medicine | Admitting: Family Medicine

## 2017-10-15 VITALS — BP 143/79 | HR 78 | Temp 97.4°F | Ht 69.0 in | Wt 289.2 lb

## 2017-10-15 DIAGNOSIS — Z86718 Personal history of other venous thrombosis and embolism: Secondary | ICD-10-CM | POA: Insufficient documentation

## 2017-10-15 DIAGNOSIS — I2782 Chronic pulmonary embolism: Secondary | ICD-10-CM

## 2017-10-15 DIAGNOSIS — Z0000 Encounter for general adult medical examination without abnormal findings: Secondary | ICD-10-CM

## 2017-10-15 DIAGNOSIS — Z309 Encounter for contraceptive management, unspecified: Secondary | ICD-10-CM

## 2017-10-15 DIAGNOSIS — Z7901 Long term (current) use of anticoagulants: Secondary | ICD-10-CM | POA: Insufficient documentation

## 2017-10-15 DIAGNOSIS — Z124 Encounter for screening for malignant neoplasm of cervix: Secondary | ICD-10-CM | POA: Insufficient documentation

## 2017-10-15 DIAGNOSIS — Z6841 Body Mass Index (BMI) 40.0 and over, adult: Secondary | ICD-10-CM

## 2017-10-15 DIAGNOSIS — Z86711 Personal history of pulmonary embolism: Secondary | ICD-10-CM | POA: Insufficient documentation

## 2017-10-15 DIAGNOSIS — Z0001 Encounter for general adult medical examination with abnormal findings: Secondary | ICD-10-CM | POA: Insufficient documentation

## 2017-10-15 DIAGNOSIS — D6859 Other primary thrombophilia: Secondary | ICD-10-CM | POA: Insufficient documentation

## 2017-10-15 DIAGNOSIS — Z1239 Encounter for other screening for malignant neoplasm of breast: Secondary | ICD-10-CM

## 2017-10-15 DIAGNOSIS — Z13228 Encounter for screening for other metabolic disorders: Secondary | ICD-10-CM | POA: Insufficient documentation

## 2017-10-15 NOTE — Progress Notes (Signed)
Subjective:  Patient ID: Donna [name], female    DOB: 24-Feb-1971  Age: 47 y.o. 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 Denies acute concerns today. She does not exercise routinely.  Past Medical History:  Diagnosis Date  . DVT (deep venous thrombosis) ([place]) 12/06/2012  . Lung collapse   . Pulmonary embolism, blood-clot, obstetric     Past Surgical History:  Procedure Laterality Date  . RIB RESECTION      No Known Allergies   Outpatient Medications Prior to Visit  Medication Sig Dispense Refill  . warfarin (COUMADIN) 5 MG tablet TAKE 2 TABLETS BY MOUTH DAILY AS DIRECTED BY COUMADIN CLINIC 60 tablet 2  . cephALEXin (KEFLEX) 500 MG capsule Take 1 capsule (500 mg total) by mouth 2 (two) times daily. (Patient not taking: Reported on 10/15/2017) 10 capsule 0  . ferrous sulfate 325 (65 FE) MG tablet One tablet orally twice daily with a meal (Patient not taking: Reported on 10/15/2017) 60 tablet 2   No facility-administered medications prior to visit.     ROS Review of Systems  Constitutional: Negative for activity change, appetite change and fatigue.  HENT: Negative for congestion, sinus pressure and sore throat.   Eyes: Negative for visual disturbance.  Respiratory: Negative for cough, chest tightness, shortness of breath and wheezing.   Cardiovascular: Negative for chest pain and palpitations.  Gastrointestinal: Negative for abdominal distention, abdominal pain and constipation.  Endocrine: Negative for polydipsia.  Genitourinary: Negative for dysuria and frequency.  Musculoskeletal: Negative for arthralgias and back pain.  Skin:  Negative for rash.  Neurological: Negative for tremors, light-headedness and numbness.  Hematological: Does not bruise/bleed easily.  Psychiatric/Behavioral: Negative for agitation and behavioral problems.    Objective:  BP (!) 143/79   Pulse 78   Temp (!) 97.4 F (36.3 C) (Oral)   Ht _0  (1.753 m)   Wt 289 lb 3.2 oz (131.2 kg)   LMP 10/02/2017   SpO2 97%   BMI 42.71 kg/m   BP/Weight 10/15/2017 09/20/2017 11/07/5359  Systolic BP 443 154 008  Diastolic BP 79 73 84  Wt. (Lbs) 289.2 - 292  BMI 42.71 - 43.12      Physical Exam  Constitutional: She is oriented to person, place, and time. She appears well-developed and well-nourished. No distress.  Obese  HENT:  Head: Normocephalic.  Right Ear: External ear normal.  Left Ear: External ear normal.  Nose: Nose normal.  Mouth/Throat: Oropharynx is clear and moist.  Eyes: Pupils are equal, round, and reactive to light. Conjunctivae and EOM are normal.  Neck: Normal range of motion. No JVD present.  Cardiovascular: Normal rate, regular rhythm, normal heart sounds and intact distal pulses. Exam reveals no gallop.  No murmur heard. Pulmonary/Chest: Effort normal and breath sounds normal. No respiratory distress. She has no wheezes. She has no rales. She exhibits no tenderness. Right breast exhibits no mass and no tenderness. Left breast exhibits no mass and no tenderness.  Abdominal: Soft. Bowel sounds are normal. She exhibits no distension and no mass. There is no tenderness.  Genitourinary:  Genitourinary Comments: External genitalia, vagina, cervix, adnexa-normal  Musculoskeletal: Normal range of motion. She exhibits no edema or tenderness.  Neurological: She is alert and oriented to person, place, and time. She has normal reflexes.  Skin: Skin is warm and dry. She is not diaphoretic.  Psychiatric: She has a normal mood and affect.     Assessment & Plan:   1. Annual physical exam Counseled on 150 minutes of exercise per week,  healthy eating (including decreased daily intake of saturated fats, cholesterol, added sugars, sodium), STI prevention, routine healthcare maintenance.  2. Screening for breast cancer - MM Digital Screening; Future  3. Screening for cervical cancer - Cytology - PAP([place])  4. Screening for metabolic disorder - [id number] - Lipid panel  5. Other chronic pulmonary embolism without acute cor pulmonale (HCC) Currently on Coumadin Had headaches with Xarelto in the past  6. Class 3 severe obesity due to excess calories without serious comorbidity with body mass index (BMI) of 40.0 to 44.9 in adult [facility]) Discussed reducing portion sizes, avoiding late meals, increasing physical activity   No orders of the defined types were placed in this encounter.   Follow-up: Return in about 6 months (around 04/16/2018) for coordination of care and follow up of weight.   Charlott Rakes MD

## 2017-10-15 NOTE — Patient Instructions (Addendum)
Take an extra 5MG  pill tonight and follow up with Erline Levine in 4 weeks.    Calorie Counting for Weight Loss Calories are units of energy. Your body needs a certain amount of calories from food to keep you going throughout the day. When you eat more calories than your body needs, your body stores the extra calories as fat. When you eat fewer calories than your body needs, your body burns fat to get the energy it needs. Calorie counting means keeping track of how many calories you eat and drink each day. Calorie counting can be helpful if you need to lose weight. If you make sure to eat fewer calories than your body needs, you should lose weight. Ask your health care provider what a healthy weight is for you. For calorie counting to work, you will need to eat the right number of calories in a day in order to lose a healthy amount of weight per week. A dietitian can help you determine how many calories you need in a day and will give you suggestions on how to reach your calorie goal.  A healthy amount of weight to lose per week is usually 1-2 lb (0.5-0.9 kg). This usually means that your daily calorie intake should be reduced by 500-750 calories.  Eating 1,200 - 1,500 calories per day can help most women lose weight.  Eating 1,500 - 1,800 calories per day can help most men lose weight.  What is my plan? My goal is to have __________ calories per day. If I have this many calories per day, I should lose around __________ pounds per week. What do I need to know about calorie counting? In order to meet your daily calorie goal, you will need to:  Find out how many calories are in each food you would like to eat. Try to do this before you eat.  Decide how much of the food you plan to eat.  Write down what you ate and how many calories it had. Doing this is called keeping a food log.  To successfully lose weight, it is important to balance calorie counting with a healthy lifestyle that includes regular  activity. Aim for 150 minutes of moderate exercise (such as walking) or 75 minutes of vigorous exercise (such as running) each week. Where do I find calorie information?  The number of calories in a food can be found on a Nutrition Facts label. If a food does not have a Nutrition Facts label, try to look up the calories online or ask your dietitian for help. Remember that calories are listed per serving. If you choose to have more than one serving of a food, you will have to multiply the calories per serving by the amount of servings you plan to eat. For example, the label on a package of bread might say that a serving size is 1 slice and that there are 90 calories in a serving. If you eat 1 slice, you will have eaten 90 calories. If you eat 2 slices, you will have eaten 180 calories. How do I keep a food log? Immediately after each meal, record the following information in your food log:  What you ate. Don't forget to include toppings, sauces, and other extras on the food.  How much you ate. This can be measured in cups, ounces, or number of items.  How many calories each food and drink had.  The total number of calories in the meal.  Keep your food log near you,  such as in a small notebook in your pocket, or use a mobile app or website. Some programs will calculate calories for you and show you how many calories you have left for the day to meet your goal. What are some calorie counting tips?  Use your calories on foods and drinks that will fill you up and not leave you hungry: ? Some examples of foods that fill you up are nuts and nut butters, vegetables, lean proteins, and high-fiber foods like whole grains. High-fiber foods are foods with more than 5 g fiber per serving. ? Drinks such as sodas, specialty coffee drinks, alcohol, and juices have a lot of calories, yet do not fill you up.  Eat nutritious foods and avoid empty calories. Empty calories are calories you get from foods or  beverages that do not have many vitamins or protein, such as candy, sweets, and soda. It is better to have a nutritious high-calorie food (such as an avocado) than a food with few nutrients (such as a bag of chips).  Know how many calories are in the foods you eat most often. This will help you calculate calorie counts faster.  Pay attention to calories in drinks. Low-calorie drinks include water and unsweetened drinks.  Pay attention to nutrition labels for "low fat" or "fat free" foods. These foods sometimes have the same amount of calories or more calories than the full fat versions. They also often have added sugar, starch, or salt, to make up for flavor that was removed with the fat.  Find a way of tracking calories that works for you. Get creative. Try different apps or programs if writing down calories does not work for you. What are some portion control tips?  Know how many calories are in a serving. This will help you know how many servings of a certain food you can have.  Use a measuring cup to measure serving sizes. You could also try weighing out portions on a kitchen scale. With time, you will be able to estimate serving sizes for some foods.  Take some time to put servings of different foods on your favorite plates, bowls, and cups so you know what a serving looks like.  Try not to eat straight from a bag or box. Doing this can lead to overeating. Put the amount you would like to eat in a cup or on a plate to make sure you are eating the right portion.  Use smaller plates, glasses, and bowls to prevent overeating.  Try not to multitask (for example, watch TV or use your computer) while eating. If it is time to eat, sit down at a table and enjoy your food. This will help you to know when you are full. It will also help you to be aware of what you are eating and how much you are eating. What are tips for following this plan? Reading food labels  Check the calorie count compared  to the serving size. The serving size may be smaller than what you are used to eating.  Check the source of the calories. Make sure the food you are eating is high in vitamins and protein and low in saturated and trans fats. Shopping  Read nutrition labels while you shop. This will help you make healthy decisions before you decide to purchase your food.  Make a grocery list and stick to it. Cooking  Try to cook your favorite foods in a healthier way. For example, try baking instead of frying.  Use low-fat dairy products. Meal planning  Use more fruits and vegetables. Half of your plate should be fruits and vegetables.  Include lean proteins like poultry and fish. How do I count calories when eating out?  Ask for smaller portion sizes.  Consider sharing an entree and sides instead of getting your own entree.  If you get your own entree, eat only half. Ask for a box at the beginning of your meal and put the rest of your entree in it so you are not tempted to eat it.  If calories are listed on the menu, choose the lower calorie options.  Choose dishes that include vegetables, fruits, whole grains, low-fat dairy products, and lean protein.  Choose items that are boiled, broiled, grilled, or steamed. Stay away from items that are buttered, battered, fried, or served with cream sauce. Items labeled "crispy" are usually fried, unless stated otherwise.  Choose water, low-fat milk, unsweetened iced tea, or other drinks without added sugar. If you want an alcoholic beverage, choose a lower calorie option such as a glass of wine or light beer.  Ask for dressings, sauces, and syrups on the side. These are usually high in calories, so you should limit the amount you eat.  If you want a salad, choose a garden salad and ask for grilled meats. Avoid extra toppings like bacon, cheese, or fried items. Ask for the dressing on the side, or ask for olive oil and vinegar or lemon to use as  dressing.  Estimate how many servings of a food you are given. For example, a serving of cooked rice is  cup or about the size of half a baseball. Knowing serving sizes will help you be aware of how much food you are eating at restaurants. The list below tells you how big or small some common portion sizes are based on everyday objects: ? 1 oz-4 stacked dice. ? 3 oz-1 deck of cards. ? 1 tsp-1 die. ? 1 Tbsp- a ping-pong ball. ? 2 Tbsp-1 ping-pong ball. ?  cup- baseball. ? 1 cup-1 baseball. Summary  Calorie counting means keeping track of how many calories you eat and drink each day. If you eat fewer calories than your body needs, you should lose weight.  A healthy amount of weight to lose per week is usually 1-2 lb (0.5-0.9 kg). This usually means reducing your daily calorie intake by 500-750 calories.  The number of calories in a food can be found on a Nutrition Facts label. If a food does not have a Nutrition Facts label, try to look up the calories online or ask your dietitian for help.  Use your calories on foods and drinks that will fill you up, and not on foods and drinks that will leave you hungry.  Use smaller plates, glasses, and bowls to prevent overeating. This information is not intended to replace advice given to you by your health care provider. Make sure you discuss any questions you have with your health care provider. Document Released: 07/03/2005 Document Revised: 06/02/2016 Document Reviewed: 06/02/2016 Elsevier Interactive Patient Education  [employer].

## 2017-10-16 LAB — LIPID PANEL
Chol/HDL Ratio: 5.9 ratio — ABNORMAL HIGH (ref 0.0–4.4)
Cholesterol, Total: 223 mg/dL — ABNORMAL HIGH (ref 100–199)
HDL: 38 mg/dL — AB (ref >39)
LDL Calculated: 141 mg/dL — ABNORMAL HIGH (ref 0–99)
Triglycerides: 221 mg/dL — ABNORMAL HIGH (ref 0–149)
VLDL Cholesterol Cal: 44 mg/dL — ABNORMAL HIGH (ref 5–40)

## 2017-10-16 LAB — CMP14+EGFR
ALT: 23 IU/L (ref 0–32)
AST: 14 IU/L (ref 0–40)
Albumin/Globulin Ratio: 1.2 (ref 1.2–2.2)
Albumin: 3.7 g/dL (ref 3.5–5.5)
Alkaline Phosphatase: 62 IU/L (ref 39–117)
BUN/Creatinine Ratio: 16 (ref 9–23)
BUN: 14 mg/dL (ref 6–24)
Bilirubin Total: <0.2 mg/dL (ref 0.0–1.2)
CO2: 19 mmol/L — ABNORMAL LOW (ref 20–29)
Calcium: 9.1 mg/dL (ref 8.7–10.2)
Chloride: 106 mmol/L (ref 96–106)
Creatinine, Ser: 0.87 mg/dL (ref 0.57–1.00)
GFR calc non Af Amer: 80 mL/min/{1.73_m2} (ref >59)
GFR, EST AFRICAN AMERICAN: 92 mL/min/{1.73_m2} (ref >59)
Globulin, Total: 3 g/dL (ref 1.5–4.5)
Glucose: 96 mg/dL (ref 65–99)
POTASSIUM: 4.2 mmol/L (ref 3.5–5.2)
Sodium: 141 mmol/L (ref 134–144)
TOTAL PROTEIN: 6.7 g/dL (ref 6.0–8.5)

## 2017-10-17 ENCOUNTER — Other Ambulatory Visit: Payer: Self-pay | Admitting: Family Medicine

## 2017-10-17 DIAGNOSIS — E785 Hyperlipidemia, unspecified: Secondary | ICD-10-CM | POA: Insufficient documentation

## 2017-10-17 DIAGNOSIS — E7800 Pure hypercholesterolemia, unspecified: Secondary | ICD-10-CM

## 2017-10-17 MED ORDER — ATORVASTATIN CALCIUM 20 MG PO TABS: 20.0000 mg | ORAL_TABLET | Freq: Every day | ORAL | 3 refills | Status: DC

## 2017-10-18 LAB — CYTOLOGY - PAP
Chlamydia: NEGATIVE
Diagnosis: NEGATIVE
HPV: NOT DETECTED
NEISSERIA GONORRHEA: NEGATIVE

## 2017-10-19 ENCOUNTER — Telehealth: Payer: Self-pay

## 2017-10-19 NOTE — Telephone Encounter (Signed)
Patient was called and a voicemail was left informing patient to return phone call for lab results.   If patient returns phone call please inform patient PAP smear is negative for malignancy. STD tests are negative. Cholesterol is elevated and I will need her to work on a low cholesterol diet. I have placed her on a Cholesterol medication.

## 2017-10-23 ENCOUNTER — Telehealth: Payer: Self-pay

## 2017-10-23 NOTE — Telephone Encounter (Signed)
Pt returned nurse call to go over results went over results with pt and she doesn't have any questions or concerns

## 2017-11-07 ENCOUNTER — Inpatient Hospital Stay: Payer: Self-pay | Admitting: Hematology

## 2017-11-07 ENCOUNTER — Inpatient Hospital Stay: Payer: Self-pay

## 2017-11-09 MED FILL — WARFARIN SODIUM 5 MG TABLET: 5 | 30 days supply | Qty: 60 | Fill #1

## 2017-11-13 ENCOUNTER — Ambulatory Visit: Payer: Medicaid Other | Attending: Family Medicine | Admitting: Pharmacist

## 2017-11-13 DIAGNOSIS — Z5181 Encounter for therapeutic drug level monitoring: Secondary | ICD-10-CM | POA: Insufficient documentation

## 2017-11-13 DIAGNOSIS — Z7901 Long term (current) use of anticoagulants: Secondary | ICD-10-CM | POA: Insufficient documentation

## 2017-11-13 LAB — POCT INR: INR: 1.6

## 2017-11-14 NOTE — Progress Notes (Signed)
[facility] [phone]   [phone]  Hematology follow up note    Name: Donna [name] Date: 11/15/2017 MRN: [id number] DOB: 28-Apr-1971  PCP: Charlott Rakes, MD    CHIEF COMPLAIN: follow up recurrent DVT/PE, iron deficient anemia   HISTORY OF PRESENT ILLNESS:Donna [name] is a 47 y.o. 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  Her DVT was thought to have been provoked by a leg spriain that happened a few days before her developing symptoms.  She had a hypercoagulable workup which was positive for lupus anticoagulant and decreased protein C levels.  She was discharge from the hospital on 5/28, and today she is inquiring whether she can come off anticoagulation.  She denies any prior DVTs or blood clots or family history of blood clots in the past.   She denies any melena or hematochezia or bleeding complications while on coumadin.  She has one child without a history of having miscarriages.  Due to recent subtherapeutic INR levels, she was started on 10 mg of coumadin on 06/03/2013.  Her INR on 05/28/2013 was 1.4 and on 06/02/2013 was 1.5.   She had repeat hypercoagulable workup on 06/05/2013 which revealed lupus anticoagulant was not detected.  She was negative for factor V mutation and prothrombin II gene mutation.  Her anticardiolipin were alo negative.  Antithrombin III levels were in normal range.      CURRENT THERAPY:  Coumadin, oral iron pills, IV feraheme as needed   INTERIM HISTORY:  [name] returns for follow up. She was last seen by me on 05/09/2017. She is accompanied by a family member today. She notes that she was recently started on Lipitor due to elevated cholesterol levels.    Her most recent INR was on 11/13/2017 and at 1.6. She also last received IV feraheme on 09/20/2017. She notes that she felt slightly better folliwng her last treatment. She notes that she is still having her menstrual cycle with her LMP  being 2 weeks ago. She is also still on coumadin.   Labs today show: CBC with RDW elevated to 15.4, otherwise WNL. CMP pending and iron studies pending.   On review of systems, she denies any other symptoms. Pertinent positives are noted in the HPI.      PAST MEDICAL HISTORY:  has a past medical history of DVT (deep venous thrombosis) ([place]) (12/06/2012), Lung collapse, and Pulmonary embolism, blood-clot, obstetric.     PAST SURGICAL HISTORY: Past Surgical History:  Procedure Laterality Date  . RIB RESECTION     CURRENT MEDICATIONS Current Outpatient Medications on File Prior to Visit  Medication Sig Dispense Refill  . ferrous sulfate 325 (65 FE) MG tablet One tablet orally twice daily with a meal 60 tablet 2  . warfarin (COUMADIN) 5 MG tablet TAKE 2 TABLETS BY MOUTH DAILY AS DIRECTED BY COUMADIN CLINIC 60 tablet 2  . atorvastatin (LIPITOR) 20 MG tablet Take 1 tablet (20 mg total) by mouth daily. (Patient not taking: Reported on 11/15/2017) 30 tablet 3   No current facility-administered medications on file prior to visit.   ;  ALLERGIES: Patient has no known allergies.   SOCIAL HISTORY:  reports that she has never smoked. She has never used smokeless tobacco. She reports that she drinks alcohol. She reports that she does not use drugs.   FAMILY HISTORY: family history includes COPD in her father; Stroke in her father.   LABORATORY DATA:  CBC CBC Latest Ref Rng & Units 11/15/2017 09/12/2017 07/11/2017  WBC 3.9 - 10.3 K/uL 5.7 4.6 4.6  Hemoglobin 11.6 - 15.9 g/dL 12.9 13.1 12.3  Hematocrit 34.8 - 46.6 % 39.1 39.9 38.5  Platelets 145 - 400 K/uL 316 294 249    CMP Latest Ref Rng & Units 11/15/2017 10/15/2017 05/09/2017  Glucose 70 - 140 mg/dL 93 96 92  BUN 7 - 26 mg/dL 12 14 11.6  Creatinine 0.60 - 1.10 mg/dL 0.85 0.87 0.8  Sodium 136 - 145 mmol/L 136 141 136  Potassium 3.5 - 5.1 mmol/L 3.9 4.2 4.0  Chloride 98 - 109 mmol/L 107 106 -  CO2 22 - 29 mmol/L 21(L) 19(L) 19(L)    Calcium 8.4 - 10.4 mg/dL 8.8 9.1 8.8  Total Protein 6.4 - 8.3 g/dL 6.8 6.7 7.1  Total Bilirubin 0.2 - 1.2 mg/dL <0.2(L) <0.2 0.22  Alkaline Phos 40 - 150 U/L 64 62 69  AST 5 - 34 U/L 15 14 10   ALT 0 - 55 U/L 15 23 11    Results for AMRAN, MALTER (MRN [id number]) as of 11/15/2017 19:55  Ref. Range 07/11/2017 12:09 09/12/2017 12:03 11/15/2017 09:06  Iron Latest Ref Range: 41 - 142 ug/dL 52 35 (L) 132  UIBC Latest Units: ug/dL 262 246 148  TIBC Latest Ref Range: 236 - 444 ug/dL 314 281 280  %SAT Latest Ref Range: 21 - 57 % 16 (L)    Saturation Ratios Latest Ref Range: 21 - 57 %  13 (L) 47  Ferritin Latest Ref Range: 9 - 269 ng/mL 20 28 36     REVIEW OF SYSTEMS:  Constitutional: Denies fevers, chills or abnormal weight loss Eyes: Denies blurriness of vision Ears, nose, mouth, throat, and face: Denies mucositis or sore throat Respiratory: Denies cough, dyspnea or wheezes Cardiovascular: Denies palpitation, chest discomfort or lower extremity swelling Gastrointestinal:  Denies nausea, heartburn or change in bowel habits Skin: Denies abnormal skin rashes Lymphatics: Denies new lymphadenopathy or easy bruising Neurological:Denies numbness, tingling or new weaknesses Behavioral/Psych: Mood is stable, no new changes  All other systems were reviewed with the patient and are negative.  PHYSICAL EXAM:  BP 111/76 (BP Location: Left Arm, Patient Position: Sitting)   Pulse 91   Temp 98.4 F (36.9 C) (Oral)   Resp 20   Ht 5\' 9"  (1.753 m)   Wt 298 lb 3.2 oz (135.3 kg)   SpO2 98%   BMI 44.04 kg/m    Vitals:   11/15/17 0937  BP: 111/76  Pulse: 91  Resp: 20  Temp: 98.4 F (36.9 C)  TempSrc: Oral  SpO2: 98%  Weight: 298 lb 3.2 oz (135.3 kg)  Height: 5\' 9"  (1.753 m)    GENERAL:alert, no distress and comfortable; Moderately obese.  SKIN: skin color, texture, turgor are normal, no rashes or significant lesions EYES: normal, Conjunctiva are pink and non-injected, sclera  clear OROPHARYNX:no exudate, no erythema and lips, buccal mucosa, and tongue normal  NECK: supple, thyroid normal size, non-tender, without nodularity LYMPH:  no palpable lymphadenopathy in the cervical, axillary or inguinal LUNGS: clear to auscultation and percussion with normal breathing effort HEART: regular rate & rhythm and no murmurs and no lower extremity edema ABDOMEN:abdomen soft, non-tender and normal bowel sounds Musculoskeletal:no cyanosis of digits and no clubbing  NEURO: alert & oriented x 3 with fluent speech, no focal motor/sensory deficits  Radiology  No new scans    IMPRESSION AND PLAN: ETTAMAE BARKETT is a 47 y.o. female  with a history of   1. Anemia of iron deficiency, likely secondary to menorrhagia -She had fairly low ferritin and iron level, all consistent with iron deficiency. -She had good response to oral iron supplement in the past, but she is not very complaint, and has been receiving IV feraheme every 203 months. She responded well, anemia resolved afterwards. -She will continue Ferous sulfate 325mg  bid, she is tolerating well. -she will follow up with her GYN for menorrhagia -Her stool cards were negative for OB, we'll hold on colonoscopy for now. -IV Feraheme if ferritin less than 50 - I have previously advised the patient if period is heavy she can stop Coudamin for 2-3 days. It will help slow down bleeding. I encouraged her to stop the day she anticipates her cycle will come on and start back after cycle ends.  -I previously encouraged her to continue her oral iron, she is taking it twice daily.  -She last received IV feraheme on 09/20/17 -she is clinically doing well. CBC today showed normal H/H, ferritin is 36, iron level and transferrin saturation normal.  I recommend her to maintain ferritin above 50, will set up IV Feraheme once. -She has been receiving IV Feraheme 3-4 times a year, I will set up lab every 3 months X3 -F/u in 9 months   2.   Recurrent DVT/PE.  --She had provoked DVT and PE in 2014, and second episode off on provoked TE in December 2015 after she came off Coumadin. --Her PTT Lupus anticoagulant was 89.1 on 12/05/2012 and decreased Protein C levels.  Her repeated lupus anticoagulant is negative without detection of a lupus anticoagulant. Hypercoagulopathy workup was negative when she was off Coumadin. -I previously recommended anticoagulation for life long if there is no contraindication such as severe bleeding. -We previously discussed Different options of anticoagulation. She could not tolerate Xarelto. She had some skin thickening in the neck, questionable skin necrosis from coumadin, resolved now  -Preventive strategy such as avoid cigarette smoking, contraceptives, and aware compression stocks on [employer]. She Voiced Good Understanding. -she is tolerating Coumadin well, we'll continue and follow up with her PCP  -Due to her heavy menstrual period, OK to hold Coumadin for the first few days when she has menstrual period -She will continue coumadin.  -She notes that she was previously on xarelto and she d/c this medication due to her head hurting.  -recent INR on 11/13/17 at 1.6, which was low due to holding coumadin before testing    3. Obesity. -- Continued dieting and exercises per her PCP.    4. Elevated Cholesterol -Patient advised that she was started on Lipitor by her PCP due to elevated cholesterol -I advised the patient to lead a healthy lifestyle with healthier eating and regular exercise to aid in cholesterol levels.  -Follow up with PCP regarding this.    Plan: -Continue coumadin and oral iron as prescribed -IV Feraheme once in the next week -Lab every 3 months X3.  Will consider IV Feraheme weeklyX2 if ferritin<50 -F/u in 9 months      This document serves as a record of services personally performed by Truitt Merle, MD. It was created on her behalf by Joslyn Devon, a trained medical  scribe. The creation of this record is based on the scribe's personal observations and the provider's statements to them. This document has been checked and approved by the attending provider.   Truitt Merle, MD 11/15/2017

## 2017-11-15 ENCOUNTER — Telehealth: Payer: Self-pay | Admitting: Hematology

## 2017-11-15 ENCOUNTER — Inpatient Hospital Stay: Payer: Self-pay | Attending: Hematology

## 2017-11-15 ENCOUNTER — Inpatient Hospital Stay (HOSPITAL_BASED_OUTPATIENT_CLINIC_OR_DEPARTMENT_OTHER): Payer: Self-pay | Admitting: Hematology

## 2017-11-15 ENCOUNTER — Encounter: Payer: Self-pay | Admitting: Hematology

## 2017-11-15 VITALS — BP 111/76 | HR 91 | Temp 98.4°F | Resp 20 | Ht 69.0 in | Wt 298.2 lb

## 2017-11-15 DIAGNOSIS — Z86711 Personal history of pulmonary embolism: Secondary | ICD-10-CM | POA: Insufficient documentation

## 2017-11-15 DIAGNOSIS — Z7901 Long term (current) use of anticoagulants: Secondary | ICD-10-CM

## 2017-11-15 DIAGNOSIS — Z86718 Personal history of other venous thrombosis and embolism: Secondary | ICD-10-CM | POA: Insufficient documentation

## 2017-11-15 DIAGNOSIS — D509 Iron deficiency anemia, unspecified: Secondary | ICD-10-CM | POA: Insufficient documentation

## 2017-11-15 LAB — COMPREHENSIVE METABOLIC PANEL
ALBUMIN: 3.1 g/dL — AB (ref 3.5–5.0)
ALT: 15 U/L (ref 0–55)
ANION GAP: 8 (ref 3–11)
AST: 15 U/L (ref 5–34)
Alkaline Phosphatase: 64 U/L (ref 40–150)
BUN: 12 mg/dL (ref 7–26)
CHLORIDE: 107 mmol/L (ref 98–109)
CO2: 21 mmol/L — ABNORMAL LOW (ref 22–29)
Calcium: 8.8 mg/dL (ref 8.4–10.4)
Creatinine, Ser: 0.85 mg/dL (ref 0.60–1.10)
GFR calc Af Amer: >60 mL/min (ref >60)
Glucose, Bld: 93 mg/dL (ref 70–140)
POTASSIUM: 3.9 mmol/L (ref 3.5–5.1)
Sodium: 136 mmol/L (ref 136–145)
TOTAL PROTEIN: 6.8 g/dL (ref 6.4–8.3)

## 2017-11-15 LAB — CBC WITH DIFFERENTIAL/PLATELET
Basophils Absolute: 0 10*3/uL (ref 0.0–0.1)
Basophils Relative: 1 %
EOS ABS: 0.1 10*3/uL (ref 0.0–0.5)
EOS PCT: 2 %
HCT: 39.1 % (ref 34.8–46.6)
Hemoglobin: 12.9 g/dL (ref 11.6–15.9)
LYMPHS ABS: 2 10*3/uL (ref 0.9–3.3)
LYMPHS PCT: 34 %
MCH: 29.9 pg (ref 25.1–34.0)
MCHC: 33.1 g/dL (ref 31.5–36.0)
MCV: 90.3 fL (ref 79.5–101.0)
Monocytes Absolute: 0.5 10*3/uL (ref 0.1–0.9)
Monocytes Relative: 9 %
Neutro Abs: 3.1 10*3/uL (ref 1.5–6.5)
Neutrophils Relative %: 54 %
PLATELETS: 316 10*3/uL (ref 145–400)
RBC: 4.34 MIL/uL (ref 3.70–5.45)
RDW: 15.4 % — ABNORMAL HIGH (ref 11.2–14.5)
WBC: 5.7 10*3/uL (ref 3.9–10.3)

## 2017-11-15 LAB — FERRITIN: Ferritin: 36 ng/mL (ref 9–269)

## 2017-11-15 LAB — IRON AND TIBC
IRON: 132 ug/dL (ref 41–142)
Saturation Ratios: 47 % (ref 21–57)
TIBC: 280 ug/dL (ref 236–444)
UIBC: 148 ug/dL

## 2017-11-15 NOTE — Telephone Encounter (Signed)
Appointments scheduled AVS/Calendar printed per 5/2 los °

## 2017-11-16 ENCOUNTER — Telehealth: Payer: Self-pay | Admitting: Hematology

## 2017-11-16 NOTE — Telephone Encounter (Signed)
Scheduled appt per 5/2 sch message - left message with appt date and time.

## 2017-11-20 ENCOUNTER — Telehealth: Payer: Self-pay | Admitting: Hematology

## 2017-11-20 ENCOUNTER — Telehealth: Payer: Self-pay

## 2017-11-20 NOTE — Telephone Encounter (Signed)
Rescheduled appt per 5/7 sch msg - spoke with pt and confirmed appt.

## 2017-11-20 NOTE — Telephone Encounter (Signed)
Patient called stating cannot come to appointment on Friday 5/10 at 9:30 am. Sent scheduling message to cancel and reschedule this appointment for IV iron.

## 2017-11-22 ENCOUNTER — Inpatient Hospital Stay: Payer: Self-pay

## 2017-11-22 VITALS — BP 104/70 | HR 87 | Temp 98.6°F | Resp 18

## 2017-11-22 DIAGNOSIS — D509 Iron deficiency anemia, unspecified: Secondary | ICD-10-CM

## 2017-11-22 MED ORDER — SODIUM CHLORIDE 0.9 % IV SOLN
INTRAVENOUS | Status: DC
Start: 2017-11-22 — End: 2017-11-22
  Administered 2017-11-22: 09:00:00 via INTRAVENOUS

## 2017-11-22 MED ORDER — SODIUM CHLORIDE 0.9 % IV SOLN
510.0000 mg | Freq: Once | INTRAVENOUS | Status: AC
  Administered 2017-11-22: 510 mg via INTRAVENOUS
  Filled 2017-11-22: qty 17

## 2017-11-22 NOTE — Patient Instructions (Signed)

## 2017-11-23 ENCOUNTER — Ambulatory Visit: Payer: Medicaid Other

## 2017-11-28 ENCOUNTER — Ambulatory Visit: Payer: Medicaid Other | Attending: Family Medicine | Admitting: Pharmacist

## 2017-11-28 DIAGNOSIS — Z7901 Long term (current) use of anticoagulants: Secondary | ICD-10-CM | POA: Insufficient documentation

## 2017-11-28 DIAGNOSIS — Z5181 Encounter for therapeutic drug level monitoring: Secondary | ICD-10-CM | POA: Insufficient documentation

## 2017-11-28 LAB — POCT INR: INR: 1.4

## 2017-12-04 MED FILL — WARFARIN SODIUM 5 MG TABLET: 5 | 30 days supply | Qty: 60 | Fill #2

## 2017-12-05 ENCOUNTER — Ambulatory Visit: Payer: Self-pay | Attending: Family Medicine | Admitting: Pharmacist

## 2017-12-05 DIAGNOSIS — Z7901 Long term (current) use of anticoagulants: Secondary | ICD-10-CM | POA: Insufficient documentation

## 2017-12-05 LAB — POCT INR: INR: 3.1 — AB (ref 2.0–3.0)

## 2017-12-05 NOTE — Patient Instructions (Signed)
Thank you for coming to see [place] today. Continue taking 2 tablets every day. I will see you back in 1 week. If your INR is in range, we can space INR visits out a bit.

## 2017-12-12 ENCOUNTER — Ambulatory Visit: Payer: Medicaid Other | Attending: Family Medicine | Admitting: Pharmacist

## 2017-12-12 DIAGNOSIS — I2699 Other pulmonary embolism without acute cor pulmonale: Secondary | ICD-10-CM | POA: Insufficient documentation

## 2017-12-12 LAB — POCT INR: INR: 2.5 (ref 2.0–3.0)

## 2018-01-08 ENCOUNTER — Ambulatory Visit: Payer: Self-pay | Attending: Family Medicine | Admitting: Pharmacist

## 2018-01-08 DIAGNOSIS — Z7901 Long term (current) use of anticoagulants: Secondary | ICD-10-CM | POA: Insufficient documentation

## 2018-01-08 LAB — POCT INR: INR: 2.6 (ref 2.0–3.0)

## 2018-01-08 MED ORDER — WARFARIN SODIUM 5 MG PO TABS: ORAL_TABLET | ORAL | 3 refills | Status: DC

## 2018-01-08 MED FILL — WARFARIN SODIUM 5 MG TABLET: 5 | 30 days supply | Qty: 60 | Fill #0

## 2018-01-11 ENCOUNTER — Ambulatory Visit
Admission: RE | Admit: 2018-01-11 | Discharge: 2018-01-11 | Disposition: A | Discharge status: Home / Self Care | Payer: No Typology Code available for payment source | Source: Ambulatory Visit | Attending: Family Medicine | Admitting: Family Medicine

## 2018-01-11 ENCOUNTER — Ambulatory Visit: Payer: Medicaid Other

## 2018-01-11 DIAGNOSIS — Z1239 Encounter for other screening for malignant neoplasm of breast: Secondary | ICD-10-CM

## 2018-01-29 ENCOUNTER — Ambulatory Visit: Payer: No Typology Code available for payment source | Attending: Family Medicine | Admitting: Pharmacist

## 2018-01-29 DIAGNOSIS — Z7901 Long term (current) use of anticoagulants: Secondary | ICD-10-CM | POA: Insufficient documentation

## 2018-01-29 LAB — POCT INR: INR: 2.6 (ref 2.0–3.0)

## 2018-02-05 MED FILL — WARFARIN SODIUM 5 MG TABLET: 5 | 30 days supply | Qty: 60 | Fill #1

## 2018-02-15 ENCOUNTER — Inpatient Hospital Stay: Payer: No Typology Code available for payment source | Attending: Hematology

## 2018-03-04 ENCOUNTER — Encounter: Payer: No Typology Code available for payment source | Admitting: Pharmacist

## 2018-03-08 MED FILL — WARFARIN SODIUM 5 MG TABLET: 5 | 30 days supply | Qty: 60 | Fill #2

## 2018-03-14 ENCOUNTER — Ambulatory Visit (HOSPITAL_COMMUNITY)
Admission: RE | Admit: 2018-03-14 | Discharge: 2018-03-14 | Disposition: A | Discharge status: Home / Self Care | Payer: Self-pay | Source: Ambulatory Visit | Attending: Physician Assistant | Admitting: Physician Assistant

## 2018-03-14 ENCOUNTER — Ambulatory Visit: Payer: Self-pay | Attending: Family Medicine | Admitting: Physician Assistant

## 2018-03-14 VITALS — BP 115/79 | HR 85 | Temp 98.6°F | Ht 70.0 in

## 2018-03-14 DIAGNOSIS — M79662 Pain in left lower leg: Secondary | ICD-10-CM | POA: Insufficient documentation

## 2018-03-14 DIAGNOSIS — Z79899 Other long term (current) drug therapy: Secondary | ICD-10-CM | POA: Insufficient documentation

## 2018-03-14 DIAGNOSIS — Z86718 Personal history of other venous thrombosis and embolism: Secondary | ICD-10-CM | POA: Insufficient documentation

## 2018-03-14 DIAGNOSIS — Z86711 Personal history of pulmonary embolism: Secondary | ICD-10-CM | POA: Insufficient documentation

## 2018-03-14 DIAGNOSIS — Z7901 Long term (current) use of anticoagulants: Secondary | ICD-10-CM | POA: Insufficient documentation

## 2018-03-14 LAB — POCT INR: INR: 2.7 (ref 2.0–3.0)

## 2018-03-14 NOTE — Progress Notes (Signed)
Consulted with Levada Dy given patient's symptoms. She will see her at this time.

## 2018-03-14 NOTE — Progress Notes (Addendum)
LLE venous duplex prelim: negative for DVT.  Landry Mellow, RDMS, RVT   Called results to [place], [place]

## 2018-03-14 NOTE — Progress Notes (Signed)
Patient ID: Donna [name], female   DOB: 1971/04/03, 47 y.o.   MRN: [id number]     Donna [name], is a 47 y.o. female  BJS:[id number]  [id number]  DOB - [birth year]  Subjective:  Chief Complaint and HPI: Donna [name] is a 47 y.o. female here today for L calf pain for about 12 days.  H/o DVT in R calf.  Here for INR check today(which was therapeutic) and told Donna [name] that she was having L calf pain.  No new SOB.  No CP.  02/24/2018-03/02/2018 drove to [place] for a cruise-so she was sedentary.     ROS:   Constitutional:  No f/c, No night sweats, No unexplained weight loss. EENT:  No vision changes, No blurry vision, No hearing changes. No mouth, throat, or ear problems.  Respiratory: No cough, No SOB Cardiac: No CP, no palpitations GI:  No abd pain, No N/V/D. GU: No Urinary s/sx Musculoskeletal: No joint pain Neuro: No headache, no dizziness, no motor weakness.  Skin: No rash Endocrine:  No polydipsia. No polyuria.  Psych: Denies SI/HI  No problems updated.  ALLERGIES: No Known Allergies  PAST MEDICAL HISTORY: Past Medical History:  Diagnosis Date  . DVT (deep venous thrombosis) ([place]) 12/06/2012  . Lung collapse   . Pulmonary embolism, blood-clot, obstetric     MEDICATIONS AT HOME: Prior to Admission medications   Medication Sig Start Date End Date Taking? Authorizing Provider  atorvastatin (LIPITOR) 20 MG tablet Take 1 tablet (20 mg total) by mouth daily. Patient not taking: Reported on 11/15/2017 10/17/17   Charlott Rakes, MD  ferrous sulfate 325 (65 FE) MG tablet One tablet orally twice daily with a meal 01/12/16   Truitt Merle, MD  warfarin (COUMADIN) 5 MG tablet Take 2 tablets by mouth daily. 01/08/18   Charlott Rakes, MD     Objective:  EXAM:   Vitals:   03/14/18 1446  BP: 115/79  Pulse: 85  Temp: 98.6 F (37 C)  TempSrc: Oral  Height: 5\' 10"  (1.778 m)    General appearance : A&OX3. NAD. Non-toxic-appearing HEENT: Atraumatic and Normocephalic.   PERRLA. EOM intact.   Neck: supple, no JVD. No cervical lymphadenopathy. No thyromegaly Chest/Lungs:  Breathing-non-labored, Good air entry bilaterally, breath sounds normal without rales, rhonchi, or wheezing  CVS: S1 S2 regular, no murmurs, gallops, rubs  Extremities: Bilateral Lower Ext shows no edema, both legs are warm to touch with = pulse throughout.  R calf 43.5 cm, L calf 44cm-there is tenderness of the L calf and L hamstring area.  No erythema or induration.  Neg Homan's.   Neurology:  CN II-XII grossly intact, Non focal.   Psych:  TP linear. J/I WNL. Normal speech. Appropriate eye contact and affect.  Skin:  No Rash  Data Review No results found for: HGBA1C   Assessment & Plan   1. Chronic anticoagulation INR therapeutic today  2. Pain of left calf - VAS [place] LOWER EXTREMITY VENOUS (DVT); Future    Patient have been counseled extensively about nutrition and exercise    The patient was given clear instructions to go to ER or return to medical center if symptoms don't improve, worsen or new problems develop. The patient verbalized understanding. The patient was told to call to get lab results if they haven't heard anything in the next week.     Freeman Caldron, PA-C [facility] and [place], [place]   03/14/2018, 3:04 PM

## 2018-03-19 ENCOUNTER — Telehealth (INDEPENDENT_AMBULATORY_CARE_PROVIDER_SITE_OTHER): Payer: Self-pay | Admitting: *Deleted

## 2018-03-19 NOTE — Telephone Encounter (Signed)
Patient verified DOB Patient is aware of no clot being noted and to follow up as planned. No further questions.

## 2018-03-19 NOTE — Telephone Encounter (Signed)
-----   Message from [place] Donovan, [place] sent at 03/19/2018  8:46 AM EDT ----- No clot was found.  Follow-up as planned.  Thanks, Freeman Caldron, PA-C

## 2018-04-03 ENCOUNTER — Telehealth: Payer: Self-pay

## 2018-04-03 NOTE — Telephone Encounter (Signed)
Patient called in leaving voice message that she would like an appointment for lab, f/u and feraheme infusion.  She was to f/u in August but do not see a scheduled appointment that she missed.  Scheduling message was sent.

## 2018-04-04 ENCOUNTER — Telehealth: Payer: Self-pay | Admitting: Hematology

## 2018-04-04 MED FILL — WARFARIN SODIUM 5 MG TABLET: 5 | 30 days supply | Qty: 60 | Fill #3

## 2018-04-04 NOTE — Telephone Encounter (Signed)
Spoke with patient regarding appointments date/time was given per 9/18 sch msg

## 2018-04-15 ENCOUNTER — Encounter: Payer: No Typology Code available for payment source | Admitting: Pharmacist

## 2018-04-17 ENCOUNTER — Ambulatory Visit: Payer: Self-pay | Attending: Family Medicine | Admitting: Family Medicine

## 2018-04-17 ENCOUNTER — Encounter: Payer: Self-pay | Admitting: Family Medicine

## 2018-04-17 ENCOUNTER — Ambulatory Visit: Payer: Self-pay | Admitting: Pharmacist

## 2018-04-17 VITALS — BP 111/77 | HR 90 | Temp 98.0°F | Ht 70.0 in | Wt 297.2 lb

## 2018-04-17 DIAGNOSIS — Z86718 Personal history of other venous thrombosis and embolism: Secondary | ICD-10-CM | POA: Insufficient documentation

## 2018-04-17 DIAGNOSIS — Z9119 Patient's noncompliance with other medical treatment and regimen: Secondary | ICD-10-CM | POA: Insufficient documentation

## 2018-04-17 DIAGNOSIS — Z7901 Long term (current) use of anticoagulants: Secondary | ICD-10-CM | POA: Insufficient documentation

## 2018-04-17 DIAGNOSIS — Z79899 Other long term (current) drug therapy: Secondary | ICD-10-CM | POA: Insufficient documentation

## 2018-04-17 DIAGNOSIS — E7800 Pure hypercholesterolemia, unspecified: Secondary | ICD-10-CM | POA: Insufficient documentation

## 2018-04-17 DIAGNOSIS — Z9111 Patient's noncompliance with dietary regimen: Secondary | ICD-10-CM | POA: Insufficient documentation

## 2018-04-17 DIAGNOSIS — D509 Iron deficiency anemia, unspecified: Secondary | ICD-10-CM | POA: Insufficient documentation

## 2018-04-17 LAB — POCT INR: INR: 3.3 — AB (ref 2.0–3.0)

## 2018-04-17 NOTE — Progress Notes (Signed)
Subjective:  Patient ID: Donna [name], female    DOB: Mar 07, 1971  Age: 47 y.o. MRN: [id number]  CC: Anemia   [name] is a 47 year old female with a history of thromboembolic disease  (PE, DVT currently on Coumadin), questionable hypercoagulable state (positive lupus anticoagulant, protein C deficiency initially which normalized on repeat), iron deficiency anemia (followed by Hematology), hyperlipidemia here for a follow-up visit. She has been compliant with Lipitor and has not been compliant with low-cholesterol diet or exercise.  Doing well on Coumadin and sometimes has intermittent bruising if and when she does not recall history of trauma. Her INR was 3.3 today. She has no additional concerns.  Past Medical History:  Diagnosis Date  . DVT (deep venous thrombosis) ([place]) 12/06/2012  . Lung collapse   . Pulmonary embolism, blood-clot, obstetric     Past Surgical History:  Procedure Laterality Date  . RIB RESECTION      No Known Allergies   Outpatient Medications Prior to Visit  Medication Sig Dispense Refill  . ferrous sulfate 325 (65 FE) MG tablet One tablet orally twice daily with a meal 60 tablet 2  . warfarin (COUMADIN) 5 MG tablet Take 2 tablets by mouth daily. 60 tablet 3  . atorvastatin (LIPITOR) 20 MG tablet Take 1 tablet (20 mg total) by mouth daily. (Patient not taking: Reported on 11/15/2017) 30 tablet 3   No facility-administered medications prior to visit.     ROS Review of Systems  Constitutional: Negative for activity change, appetite change and fatigue.  HENT: Negative for congestion, sinus pressure and sore throat.   Eyes: Negative for visual disturbance.  Respiratory: Negative for cough, chest tightness, shortness of breath and wheezing.   Cardiovascular: Negative for chest pain and palpitations.  Gastrointestinal: Negative for abdominal distention, abdominal pain and constipation.  Endocrine: Negative for polydipsia.  Genitourinary:  Negative for dysuria and frequency.  Musculoskeletal: Negative for arthralgias and back pain.  Skin: Negative for rash.  Neurological: Negative for tremors, light-headedness and numbness.  Hematological: Does not bruise/bleed easily.  Psychiatric/Behavioral: Negative for agitation and behavioral problems.    Objective:  BP 111/77   Pulse 90   Temp 98 F (36.7 C) (Oral)   Ht '5\' 10"'  (1.778 m)   Wt 297 lb 3.2 oz (134.8 kg)   SpO2 95%   BMI 42.64 kg/m   BP/Weight 04/17/2018 8/84/1660 12/18/158  Systolic BP 109 323 557  Diastolic BP 77 79 70  Wt. (Lbs) 297.2 - -  BMI 42.64 42.79 -      Physical Exam  Constitutional: She is oriented to person, place, and time. She appears well-developed and well-nourished.  Cardiovascular: Normal rate, normal heart sounds and intact distal pulses.  No murmur heard. Pulmonary/Chest: Effort normal and breath sounds normal. She has no wheezes. She has no rales. She exhibits no tenderness.  Abdominal: Soft. Bowel sounds are normal. She exhibits no distension and no mass. There is no tenderness.  Musculoskeletal: Normal range of motion.  Neurological: She is alert and oriented to person, place, and time.  Skin: Skin is warm and dry.  Psychiatric: She has a normal mood and affect.     CMP Latest Ref Rng & Units 11/15/2017 10/15/2017 05/09/2017  Glucose 70 - 140 mg/dL 93 96 92  BUN 7 - 26 mg/dL 12 14 11.6  Creatinine 0.60 - 1.10 mg/dL 0.85 0.87 0.8  Sodium 136 - 145 mmol/L 136 141 136  Potassium 3.5 - 5.1 mmol/L 3.9  4.2 4.0  Chloride 98 - 109 mmol/L 107 106 -  CO2 22 - 29 mmol/L 21(L) 19(L) 19(L)  Calcium 8.4 - 10.4 mg/dL 8.8 9.1 8.8  Total Protein 6.4 - 8.3 g/dL 6.8 6.7 7.1  Total Bilirubin 0.2 - 1.2 mg/dL <0.2(L) <0.2 0.22  Alkaline Phos 40 - 150 U/L 64 62 69  AST 5 - 34 U/L '15 14 10  ' ALT 0 - 55 U/L '15 23 11    ' Lipid Panel     Component Value Date/Time   CHOL 223 (H) 10/15/2017 1205   TRIG 221 (H) 10/15/2017 1205   HDL 38 (L) 10/15/2017  1205   CHOLHDL 5.9 (H) 10/15/2017 1205   CHOLHDL 5.3 05/16/2013 0925   VLDL 36 05/16/2013 0925   LDLCALC 141 (H) 10/15/2017 1205    The 10-year ASCVD risk score Mikey Bussing DC Jr., et al., 2013) is: 1.5%   Values used to calculate the score:     Age: 87 years     Sex: Female     Is Non-Hispanic African American: Yes     Diabetic: No     Tobacco smoker: No     Systolic Blood Pressure: 156 mmHg     Is BP treated: No     HDL Cholesterol: 38 mg/dL     Total Cholesterol: 223 mg/dL  Lab Results  Component Value Date   INR 3.3 (A) 04/17/2018   INR 2.7 03/14/2018   INR 2.6 01/29/2018    Assessment & Plan:   1. Personal history of venous thrombosis and embolism Currently on Coumadin-will be on lifelong anticoagulation INR was slightly supratherapeutic at 3.3 Seen by clinical pharmacist and Coumadin dose held today and adjusted  2. Pure hypercholesterolemia Uncontrolled; ASCVD risk of 1.3% Not compliant with Lipitor Discussed low cholesterol diet, exercise and weight loss If lipid panel remains elevated, I will restart her Lipitor. - CMP14+EGFR - Lipid panel   No orders of the defined types were placed in this encounter.   Follow-up: Return in about 6 months (around 10/17/2018) for Follow-up of chronic medical conditions.   Charlott Rakes MD

## 2018-04-18 ENCOUNTER — Encounter: Payer: Self-pay | Admitting: Family Medicine

## 2018-04-18 ENCOUNTER — Other Ambulatory Visit: Payer: Self-pay | Admitting: Family Medicine

## 2018-04-18 LAB — CMP14+EGFR
ALBUMIN: 3.3 g/dL — AB (ref 3.5–5.5)
ALK PHOS: 58 IU/L (ref 39–117)
ALT: 11 IU/L (ref 0–32)
AST: 11 IU/L (ref 0–40)
Albumin/Globulin Ratio: 1.1 — ABNORMAL LOW (ref 1.2–2.2)
BUN/Creatinine Ratio: 13 (ref 9–23)
BUN: 12 mg/dL (ref 6–24)
Bilirubin Total: <0.2 mg/dL (ref 0.0–1.2)
CO2: 22 mmol/L (ref 20–29)
CREATININE: 0.95 mg/dL (ref 0.57–1.00)
Calcium: 8.8 mg/dL (ref 8.7–10.2)
Chloride: 103 mmol/L (ref 96–106)
GFR, EST AFRICAN AMERICAN: 82 mL/min/{1.73_m2} (ref >59)
GFR, EST NON AFRICAN AMERICAN: 72 mL/min/{1.73_m2} (ref >59)
GLOBULIN, TOTAL: 3 g/dL (ref 1.5–4.5)
GLUCOSE: 103 mg/dL — AB (ref 65–99)
POTASSIUM: 4.3 mmol/L (ref 3.5–5.2)
Sodium: 139 mmol/L (ref 134–144)
Total Protein: 6.3 g/dL (ref 6.0–8.5)

## 2018-04-18 LAB — LIPID PANEL
CHOL/HDL RATIO: 6.4 ratio — AB (ref 0.0–4.4)
Cholesterol, Total: 248 mg/dL — ABNORMAL HIGH (ref 100–199)
HDL: 39 mg/dL — AB (ref >39)
LDL CALC: 177 mg/dL — AB (ref 0–99)
TRIGLYCERIDES: 158 mg/dL — AB (ref 0–149)
VLDL CHOLESTEROL CAL: 32 mg/dL (ref 5–40)

## 2018-04-18 MED ORDER — ATORVASTATIN CALCIUM 20 MG PO TABS: 20.0000 mg | ORAL_TABLET | Freq: Every day | ORAL | 3 refills | Status: DC

## 2018-04-26 ENCOUNTER — Telehealth: Payer: Self-pay | Admitting: Hematology

## 2018-04-26 ENCOUNTER — Inpatient Hospital Stay: Payer: Self-pay | Attending: Hematology

## 2018-04-26 ENCOUNTER — Inpatient Hospital Stay: Payer: Self-pay

## 2018-04-26 ENCOUNTER — Ambulatory Visit (HOSPITAL_COMMUNITY)
Admission: RE | Admit: 2018-04-26 | Discharge: 2018-04-26 | Disposition: A | Discharge status: Home / Self Care | Payer: No Typology Code available for payment source | Source: Ambulatory Visit | Attending: Nurse Practitioner | Admitting: Nurse Practitioner

## 2018-04-26 ENCOUNTER — Encounter: Payer: Self-pay | Admitting: Nurse Practitioner

## 2018-04-26 ENCOUNTER — Inpatient Hospital Stay (HOSPITAL_BASED_OUTPATIENT_CLINIC_OR_DEPARTMENT_OTHER): Payer: Self-pay | Admitting: Nurse Practitioner

## 2018-04-26 VITALS — BP 121/73 | HR 96 | Resp 18

## 2018-04-26 VITALS — BP 108/76 | HR 93 | Temp 97.9°F | Resp 18 | Ht 70.0 in | Wt 300.2 lb

## 2018-04-26 DIAGNOSIS — I2782 Chronic pulmonary embolism: Secondary | ICD-10-CM

## 2018-04-26 DIAGNOSIS — Z7901 Long term (current) use of anticoagulants: Secondary | ICD-10-CM | POA: Insufficient documentation

## 2018-04-26 DIAGNOSIS — Z86711 Personal history of pulmonary embolism: Secondary | ICD-10-CM

## 2018-04-26 DIAGNOSIS — E669 Obesity, unspecified: Secondary | ICD-10-CM

## 2018-04-26 DIAGNOSIS — R1030 Lower abdominal pain, unspecified: Secondary | ICD-10-CM | POA: Insufficient documentation

## 2018-04-26 DIAGNOSIS — Z79899 Other long term (current) drug therapy: Secondary | ICD-10-CM | POA: Insufficient documentation

## 2018-04-26 DIAGNOSIS — E7800 Pure hypercholesterolemia, unspecified: Secondary | ICD-10-CM

## 2018-04-26 DIAGNOSIS — Z86718 Personal history of other venous thrombosis and embolism: Secondary | ICD-10-CM

## 2018-04-26 DIAGNOSIS — D509 Iron deficiency anemia, unspecified: Secondary | ICD-10-CM

## 2018-04-26 DIAGNOSIS — R1084 Generalized abdominal pain: Secondary | ICD-10-CM

## 2018-04-26 DIAGNOSIS — R5383 Other fatigue: Secondary | ICD-10-CM

## 2018-04-26 LAB — IRON AND TIBC
Iron: 17 ug/dL — ABNORMAL LOW (ref 41–142)
SATURATION RATIOS: 5 % — AB (ref 21–57)
TIBC: 359 ug/dL (ref 236–444)
UIBC: 342 ug/dL

## 2018-04-26 LAB — CBC WITH DIFFERENTIAL/PLATELET
Abs Immature Granulocytes: 0.03 10*3/uL (ref 0.00–0.07)
BASOS ABS: 0 10*3/uL (ref 0.0–0.1)
Basophils Relative: 1 %
EOS ABS: 0.2 10*3/uL (ref 0.0–0.5)
EOS PCT: 3 %
HEMATOCRIT: 33.9 % — AB (ref 36.0–46.0)
Hemoglobin: 10.6 g/dL — ABNORMAL LOW (ref 12.0–15.0)
Immature Granulocytes: 1 %
LYMPHS ABS: 1.9 10*3/uL (ref 0.7–4.0)
Lymphocytes Relative: 33 %
MCH: 24 pg — ABNORMAL LOW (ref 26.0–34.0)
MCHC: 31.3 g/dL (ref 30.0–36.0)
MCV: 76.7 fL — ABNORMAL LOW (ref 80.0–100.0)
Monocytes Absolute: 0.6 10*3/uL (ref 0.1–1.0)
Monocytes Relative: 10 %
Neutro Abs: 3.2 10*3/uL (ref 1.7–7.7)
Neutrophils Relative %: 52 %
Platelets: 336 10*3/uL (ref 150–400)
RBC: 4.42 MIL/uL (ref 3.87–5.11)
RDW: 16.1 % — AB (ref 11.5–15.5)
WBC: 6 10*3/uL (ref 4.0–10.5)
nRBC: 0.5 % — ABNORMAL HIGH (ref 0.0–0.2)

## 2018-04-26 LAB — FERRITIN: Ferritin: 6 ng/mL — ABNORMAL LOW (ref 11–307)

## 2018-04-26 MED ORDER — SODIUM CHLORIDE 0.9 % IV SOLN
INTRAVENOUS | Status: DC
  Administered 2018-04-26: 11:00:00 via INTRAVENOUS
  Filled 2018-04-26: qty 250

## 2018-04-26 MED ORDER — SODIUM CHLORIDE 0.9 % IV SOLN
510.0000 mg | Freq: Once | INTRAVENOUS | Status: AC
  Administered 2018-04-26: 510 mg via INTRAVENOUS
  Filled 2018-04-26: qty 17

## 2018-04-26 NOTE — Patient Instructions (Signed)

## 2018-04-26 NOTE — Progress Notes (Signed)
[place]  [phone] Fax:(336) [phone]  Clinic Follow up Note   Patient Care Team: Charlott Rakes, MD as PCP - General (Family Medicine) 04/26/2018  DIAGNOSIS: Recurrent DVT/PE, iron deficiency anemia   CURRENT THERAPY:  Coumadin, oral iron pills, IV feraheme as needed   INTERVAL HISTORY: Donna [name] returns for follow up. She was last seen 11/15/17 by Dr. Burr Medico and received IV Feraheme x1. She has not been taking oral iron consistently, takes 1 "here and there" with orange juice. She has been more fatigued lately for the past 1 month. She rests often. She is trying to be more active, but does not exercise routinely. Denies n/v/c/d. No bloody or black stool. LMP was last week, less heavy than normal; lasted 3-4 days. Takes coumadin 10 mg daily. She does not hold doses during menstrual bleed because she felt it did not affect her bleeding. She took double dose once or twice recently bc she forgot if she'd taken it. Gets INR checked routinely. Denies recent fever or chills, cough, or chest pain. Occasional DOE at baseline. She reports vague intermittent lower bilateral abdominal and occasional umbilical pain, occurs with straining or "reaching" or abrupt position change. Pain worsened lately, and lasts longer with each episode. Unsure if related to menstrual cramps or bowel movements. She has not reported this to PCP.    MEDICAL HISTORY:  Past Medical History:  Diagnosis Date  . DVT (deep venous thrombosis) ([facility]) 12/06/2012  . Lung collapse   . Pulmonary embolism, blood-clot, obstetric     SURGICAL HISTORY: Past Surgical History:  Procedure Laterality Date  . RIB RESECTION      I have reviewed the social history and family history with the patient and they are unchanged from previous note.  ALLERGIES:  has No Known Allergies.  MEDICATIONS:  Current Outpatient Medications  Medication Sig Dispense Refill  . atorvastatin (LIPITOR) 20 MG tablet Take 1  tablet (20 mg total) by mouth daily. 30 tablet 3  . ferrous sulfate 325 (65 FE) MG tablet One tablet orally twice daily with a meal 60 tablet 2  . warfarin (COUMADIN) 5 MG tablet Take 2 tablets by mouth daily. 60 tablet 3   No current facility-administered medications for this visit.     PHYSICAL EXAMINATION: ECOG PERFORMANCE STATUS: 1 - Symptomatic but completely ambulatory  Vitals:   04/26/18 0933  BP: 108/76  Pulse: 93  Resp: 18  Temp: 97.9 F (36.6 C)  SpO2: 100%   Filed Weights   04/26/18 0933  Weight: (!) 300 lb 3.2 oz (136.2 kg)    GENERAL:alert, no distress and comfortable SKIN:  no rashes or significant lesions EYES:  sclera clear OROPHARYNX:no thrush or ulcers  LYMPH:  no palpable cervical or supraclavicular lymphadenopathy LUNGS: clear to auscultation with normal breathing effort HEART: regular rate & rhythm, no lower extremity edema ABDOMEN:abdomen soft with normal bowel sounds. Mild firmness and tenderness to RLQ and LLQ  Musculoskeletal:no cyanosis of digits and no clubbing  NEURO: alert & oriented x 3 with fluent speech, no focal motor/sensory deficits  LABORATORY DATA:  I have reviewed the data as listed CBC Latest Ref Rng & Units 04/26/2018 11/15/2017 09/12/2017  WBC 4.0 - 10.5 K/uL 6.0 5.7 4.6  Hemoglobin 12.0 - 15.0 g/dL 10.6(L) 12.9 13.1  Hematocrit 36.0 - 46.0 % 33.9(L) 39.1 39.9  Platelets 150 - 400 K/uL 336 316 294     CMP Latest Ref Rng & Units 04/17/2018 11/15/2017 10/15/2017  Glucose 65 -  99 mg/dL 103(H) 93 96  BUN 6 - 24 mg/dL 12 12 14   Creatinine 0.57 - 1.00 mg/dL 0.95 0.85 0.87  Sodium 134 - 144 mmol/L 139 136 141  Potassium 3.5 - 5.2 mmol/L 4.3 3.9 4.2  Chloride 96 - 106 mmol/L 103 107 106  CO2 20 - 29 mmol/L 22 21(L) 19(L)  Calcium 8.7 - 10.2 mg/dL 8.8 8.8 9.1  Total Protein 6.0 - 8.5 g/dL 6.3 6.8 6.7  Total Bilirubin 0.0 - 1.2 mg/dL <0.2 <0.2(L) <0.2  Alkaline Phos 39 - 117 IU/L 58 64 62  AST 0 - 40 IU/L 11 15 14   ALT 0 - 32 IU/L 11 15  23       RADIOGRAPHIC STUDIES: I have personally reviewed the radiological images as listed and agreed with the findings in the report. No results found.   ASSESSMENT & PLAN: Donna [name] is a 47 y.o. female with a history of   1. Anemia of iron deficiency, likely secondary to menorrhagia -She had low ferritin and iron level, consistent with iron deficiency. -She previously had good response to oral iron supplement in the past, but she is not very complaint, and has been receiving IV feraheme every 2-3 months. She responded well, anemia resolved afterwards. -Feraheme given 07/2017, 09/2017, and 11/2017 this year -Her stool cards were negative for occult blood, has not had colonoscopy  -IV Feraheme if ferritin less than 50 -Her hgb dropped to 10.6 today from normal range; ferritin 6, serum iron 17, 5% sat. Will set her up for IV Feraheme today and next week -she had a menstrual bleed last week, no other bleeding  -she has been noncompliant with oral iron, I strongly encouraged her to restart taking 1 tab BID with orange juice, she agrees.  -will monitor lab q2 months x2 and IV Feraheme PRN, and see her back in 4 months  2. Diffuse lower abdominal pain -she has vague lower abdominal and umbilical pain for a few months, but worsened lately, unclear if related to menstrual cramps, constipation, or other process.  -Lower abdomen is firm and tender to touch, will check KUB today. If negative I suggest she f/u with PCP, and monitor when her pain occurs and what it is related to. She agrees.   2.  Recurrent DVT/PE.  --She had provoked DVT and PE in 2014, and second episode of unprovoked PE in December 2015 after she came off Coumadin. --Her PTT Lupus anticoagulant was 89.1 on 12/05/2012 and decreased Protein C levels.  Her repeated lupus anticoagulant is negative without detection of a lupus anticoagulant. Hypercoagulopathy workup was negative when she was off Coumadin. Previously reviewed by  Dr. Burr Medico -Dr. Burr Medico previously recommended lifelong anticoagulation as long as no contraindication such as severe bleeding. -She could not tolerate Xarelto. She had some skin thickening in the neck, questionable skin necrosis from coumadin, resolved now  -previously discussed preventive strategy such as avoid cigarette smoking, contraceptives, and aware compression stocks on Distance Travel.  -she does not usually hold coumadin during menstrual bleed, it did not reduce bleeding  -She will continue coumadin.  -recent INR on 10/2 at 3.3, she reports taking a double dose during that time but has since corrected her dosing. Next INR next week   3. Obesity. Strongly encouraged her to engage in routine physical exercise   4. Elevated Cholesterol On lipitor, followed by PCP    Plan: -Labs reviewed, IV Feraheme weekly x2 today and next week due to worsening iron deficiency anemia -  Restart ferrous sulfate 1 tab BID, take with orange juice  -KUB for abdominal pain -lab q2 months x2, IV Feraheme if ferritin <50  -f/u with Dr. Burr Medico in 4 months     Orders Placed This Encounter  Procedures  . DG Abd 1 View    Standing Status:   Future    Number of Occurrences:   1    Standing Expiration Date:   04/26/2019    Order Specific Question:   Reason for Exam (SYMPTOM  OR DIAGNOSIS REQUIRED)    Answer:   worsening lower abdominal pain    Order Specific Question:   Is patient pregnant?    Answer:   No    Order Specific Question:   Preferred imaging location?    Answer:   [facility]    Order Specific Question:   Radiology Contrast Protocol - do NOT remove file path    Answer:   [email or web address]   All questions were answered. The patient knows to call the clinic with any problems, questions or concerns. No barriers to learning was detected. I spent 20 minutes counseling the patient face to face. The total time spent in the appointment was 25 minutes  and more than 50% was on counseling and review of test results     Alla Feeling, NP 04/26/18

## 2018-04-26 NOTE — Telephone Encounter (Signed)
Appts scheduled avs/calendar printed per 10/11 los

## 2018-04-29 ENCOUNTER — Telehealth: Payer: Self-pay

## 2018-04-29 NOTE — Telephone Encounter (Signed)
-----   Message from Alla Feeling, NP sent at 04/29/2018  8:04 AM EDT ----- Please let know her xray was negative, no explanation for abdominal pain seen on xray. Please have her f/u with PCP for abdominal pain.  Thanks, Regan Rakers NP

## 2018-04-29 NOTE — Telephone Encounter (Signed)
Spoke with patient per Dr. Burr Medico, notified her xray was negative, no explanation for the abdominal pain, per Dr. Burr Medico instructed her to f/u with her PCP for abdominal pain.

## 2018-05-02 ENCOUNTER — Ambulatory Visit: Payer: Self-pay | Attending: Family Medicine | Admitting: Pharmacist

## 2018-05-02 DIAGNOSIS — I82409 Acute embolism and thrombosis of unspecified deep veins of unspecified lower extremity: Secondary | ICD-10-CM | POA: Insufficient documentation

## 2018-05-02 DIAGNOSIS — R1030 Lower abdominal pain, unspecified: Secondary | ICD-10-CM | POA: Insufficient documentation

## 2018-05-02 DIAGNOSIS — Z79899 Other long term (current) drug therapy: Secondary | ICD-10-CM | POA: Insufficient documentation

## 2018-05-02 DIAGNOSIS — D509 Iron deficiency anemia, unspecified: Secondary | ICD-10-CM | POA: Insufficient documentation

## 2018-05-02 DIAGNOSIS — Z7901 Long term (current) use of anticoagulants: Secondary | ICD-10-CM | POA: Insufficient documentation

## 2018-05-02 LAB — POCT INR: INR: 3.3 — AB (ref 2.0–3.0)

## 2018-05-02 MED FILL — ATORVASTATIN 20 MG TABLET: 20 | 30 days supply | Qty: 30 | Fill #0

## 2018-05-03 ENCOUNTER — Inpatient Hospital Stay: Payer: Self-pay

## 2018-05-03 VITALS — BP 103/70 | HR 93 | Temp 98.7°F | Resp 18

## 2018-05-03 DIAGNOSIS — D509 Iron deficiency anemia, unspecified: Secondary | ICD-10-CM

## 2018-05-03 MED ORDER — SODIUM CHLORIDE 0.9 % IV SOLN
INTRAVENOUS | Status: DC
  Administered 2018-05-03: 14:00:00 via INTRAVENOUS
  Filled 2018-05-03: qty 250

## 2018-05-03 MED ORDER — SODIUM CHLORIDE 0.9 % IV SOLN
510.0000 mg | Freq: Once | INTRAVENOUS | Status: AC
  Administered 2018-05-03: 510 mg via INTRAVENOUS
  Filled 2018-05-03: qty 17

## 2018-05-03 NOTE — Patient Instructions (Signed)

## 2018-05-07 ENCOUNTER — Other Ambulatory Visit: Payer: Self-pay | Admitting: Family Medicine

## 2018-05-08 MED FILL — WARFARIN SODIUM 5 MG TABLET: 5 | 30 days supply | Qty: 60 | Fill #0

## 2018-05-17 ENCOUNTER — Other Ambulatory Visit: Payer: Medicaid Other

## 2018-05-21 ENCOUNTER — Ambulatory Visit: Payer: Self-pay | Attending: Family Medicine | Admitting: Pharmacist

## 2018-05-21 DIAGNOSIS — Z7901 Long term (current) use of anticoagulants: Secondary | ICD-10-CM | POA: Insufficient documentation

## 2018-05-21 DIAGNOSIS — Z86718 Personal history of other venous thrombosis and embolism: Secondary | ICD-10-CM | POA: Insufficient documentation

## 2018-05-21 LAB — POCT INR: INR: 3.3 — AB (ref 2.0–3.0)

## 2018-06-04 ENCOUNTER — Ambulatory Visit: Payer: Self-pay | Attending: Family Medicine | Admitting: Pharmacist

## 2018-06-04 DIAGNOSIS — Z7901 Long term (current) use of anticoagulants: Secondary | ICD-10-CM | POA: Insufficient documentation

## 2018-06-04 LAB — POCT INR: INR: 3.6 — AB (ref 2.0–3.0)

## 2018-06-04 MED FILL — WARFARIN SODIUM 5 MG TABLET: 5 | 30 days supply | Qty: 60 | Fill #1

## 2018-06-04 MED FILL — ATORVASTATIN 20 MG TABLET: 20 | 30 days supply | Qty: 30 | Fill #1

## 2018-06-11 ENCOUNTER — Ambulatory Visit: Payer: Self-pay | Attending: Family Medicine | Admitting: Pharmacist

## 2018-06-11 DIAGNOSIS — I82409 Acute embolism and thrombosis of unspecified deep veins of unspecified lower extremity: Secondary | ICD-10-CM | POA: Insufficient documentation

## 2018-06-11 DIAGNOSIS — Z7901 Long term (current) use of anticoagulants: Secondary | ICD-10-CM | POA: Insufficient documentation

## 2018-06-11 DIAGNOSIS — R1030 Lower abdominal pain, unspecified: Secondary | ICD-10-CM | POA: Insufficient documentation

## 2018-06-11 DIAGNOSIS — Z79899 Other long term (current) drug therapy: Secondary | ICD-10-CM | POA: Insufficient documentation

## 2018-06-11 LAB — POCT INR: INR: 3.5 — AB (ref 2.0–3.0)

## 2018-06-17 ENCOUNTER — Inpatient Hospital Stay: Payer: Self-pay | Attending: Hematology

## 2018-06-17 DIAGNOSIS — D509 Iron deficiency anemia, unspecified: Secondary | ICD-10-CM | POA: Insufficient documentation

## 2018-06-17 LAB — CBC WITH DIFFERENTIAL/PLATELET
Abs Immature Granulocytes: 0.04 10*3/uL (ref 0.00–0.07)
BASOS ABS: 0 10*3/uL (ref 0.0–0.1)
Basophils Relative: 1 %
EOS ABS: 0.2 10*3/uL (ref 0.0–0.5)
EOS PCT: 3 %
HEMATOCRIT: 40.9 % (ref 36.0–46.0)
Hemoglobin: 13.1 g/dL (ref 12.0–15.0)
Immature Granulocytes: 1 %
Lymphocytes Relative: 39 %
Lymphs Abs: 2 10*3/uL (ref 0.7–4.0)
MCH: 27.1 pg (ref 26.0–34.0)
MCHC: 32 g/dL (ref 30.0–36.0)
MCV: 84.7 fL (ref 80.0–100.0)
Monocytes Absolute: 0.5 10*3/uL (ref 0.1–1.0)
Monocytes Relative: 9 %
NRBC: 0 % (ref 0.0–0.2)
Neutro Abs: 2.4 10*3/uL (ref 1.7–7.7)
Neutrophils Relative %: 47 %
Platelets: 284 10*3/uL (ref 150–400)
RBC: 4.83 MIL/uL (ref 3.87–5.11)
RDW: 23.9 % — AB (ref 11.5–15.5)
WBC: 5.1 10*3/uL (ref 4.0–10.5)

## 2018-06-17 LAB — COMPREHENSIVE METABOLIC PANEL
ALK PHOS: 71 U/L (ref 38–126)
ALT: 14 U/L (ref 0–44)
AST: 12 U/L — ABNORMAL LOW (ref 15–41)
Albumin: 2.9 g/dL — ABNORMAL LOW (ref 3.5–5.0)
Anion gap: 8 (ref 5–15)
BILIRUBIN TOTAL: 0.3 mg/dL (ref 0.3–1.2)
BUN: 12 mg/dL (ref 6–20)
CALCIUM: 8.3 mg/dL — AB (ref 8.9–10.3)
CO2: 24 mmol/L (ref 22–32)
Chloride: 107 mmol/L (ref 98–111)
Creatinine, Ser: 0.89 mg/dL (ref 0.44–1.00)
Glucose, Bld: 103 mg/dL — ABNORMAL HIGH (ref 70–99)
Potassium: 3.8 mmol/L (ref 3.5–5.1)
Sodium: 139 mmol/L (ref 135–145)
Total Protein: 6.6 g/dL (ref 6.5–8.1)

## 2018-06-17 LAB — IRON AND TIBC
IRON: 54 ug/dL (ref 41–142)
Saturation Ratios: 19 % — ABNORMAL LOW (ref 21–57)
TIBC: 294 ug/dL (ref 236–444)
UIBC: 239 ug/dL (ref 120–384)

## 2018-06-17 LAB — FERRITIN: Ferritin: 33 ng/mL (ref 11–307)

## 2018-06-23 ENCOUNTER — Other Ambulatory Visit: Payer: Self-pay | Admitting: Hematology

## 2018-06-24 ENCOUNTER — Telehealth: Payer: Self-pay | Admitting: Hematology

## 2018-06-24 ENCOUNTER — Telehealth: Payer: Self-pay

## 2018-06-24 NOTE — Telephone Encounter (Signed)
Scheduled appt per 12/9 sch message- unable to leave message/ vmail full. - sent reminder letter in the mail with apt date and time

## 2018-06-24 NOTE — Telephone Encounter (Signed)
-----   Message from Truitt Merle, MD sent at 06/23/2018 12:59 PM EST ----- Please let pt know her anemia resolved now, iron level better but still on low side, I will set up iv iron once next week for her, thanks  Truitt Merle  06/23/2018

## 2018-06-24 NOTE — Telephone Encounter (Signed)
Attempted to contact patient, no answer and voice mail is full, so unable to leave a message regarding her lab results.  Anemia has resolved, iron level better but still low, Dr. Burr Medico would like her to get IV iron infusion, someone from scheduling will contact her.

## 2018-07-02 ENCOUNTER — Ambulatory Visit: Payer: Self-pay | Attending: Family Medicine | Admitting: Pharmacist

## 2018-07-02 DIAGNOSIS — Z7901 Long term (current) use of anticoagulants: Secondary | ICD-10-CM | POA: Insufficient documentation

## 2018-07-02 LAB — POCT INR: INR: 2.2 (ref 2.0–3.0)

## 2018-07-05 ENCOUNTER — Inpatient Hospital Stay: Payer: Self-pay

## 2018-07-05 VITALS — BP 129/82 | HR 87 | Temp 98.3°F | Resp 14

## 2018-07-05 DIAGNOSIS — D509 Iron deficiency anemia, unspecified: Secondary | ICD-10-CM

## 2018-07-05 MED ORDER — SODIUM CHLORIDE 0.9 % IV SOLN
510.0000 mg | Freq: Once | INTRAVENOUS | Status: AC
  Administered 2018-07-05: 510 mg via INTRAVENOUS
  Filled 2018-07-05: qty 17

## 2018-07-05 NOTE — Patient Instructions (Signed)

## 2018-07-16 MED FILL — WARFARIN SODIUM 5 MG TABLET: 5 | 30 days supply | Qty: 60 | Fill #2

## 2018-07-25 MED FILL — ATORVASTATIN 20 MG TABLET: 20 | 30 days supply | Qty: 30 | Fill #0

## 2018-08-01 MED FILL — WARFARIN SODIUM 5 MG TABLET: 5 | 30 days supply | Qty: 60 | Fill #3

## 2018-08-06 ENCOUNTER — Ambulatory Visit: Payer: Self-pay | Attending: Family Medicine | Admitting: Pharmacist

## 2018-08-06 DIAGNOSIS — Z7901 Long term (current) use of anticoagulants: Secondary | ICD-10-CM | POA: Insufficient documentation

## 2018-08-06 LAB — POCT INR: INR: 2.1 (ref 2.0–3.0)

## 2018-08-19 ENCOUNTER — Inpatient Hospital Stay: Payer: Self-pay | Admitting: Hematology

## 2018-08-19 ENCOUNTER — Inpatient Hospital Stay: Payer: Self-pay | Attending: Hematology

## 2018-08-28 ENCOUNTER — Telehealth: Payer: Self-pay

## 2018-08-28 ENCOUNTER — Telehealth: Payer: Self-pay | Admitting: Hematology

## 2018-08-28 NOTE — Telephone Encounter (Signed)
Patient called missed her last appointments, sent scheduling message to reschedule.

## 2018-08-28 NOTE — Telephone Encounter (Signed)
R/s appt per 2/12 sch message - pt is aware of apt date and time

## 2018-09-06 ENCOUNTER — Other Ambulatory Visit: Payer: Self-pay | Admitting: Family Medicine

## 2018-09-06 MED FILL — WARFARIN SODIUM 5 MG TABLET: 5 | 30 days supply | Qty: 60 | Fill #0

## 2018-09-06 MED FILL — ATORVASTATIN 20 MG TABLET: 20 | 30 days supply | Qty: 30 | Fill #1

## 2018-09-12 ENCOUNTER — Ambulatory Visit: Payer: Self-pay | Attending: Family Medicine | Admitting: Pharmacist

## 2018-09-12 DIAGNOSIS — Z7901 Long term (current) use of anticoagulants: Secondary | ICD-10-CM

## 2018-09-12 LAB — POCT INR: INR: 2.4 (ref 2.0–3.0)

## 2018-09-20 ENCOUNTER — Inpatient Hospital Stay: Payer: Self-pay | Admitting: Hematology

## 2018-09-20 ENCOUNTER — Inpatient Hospital Stay: Payer: Self-pay

## 2018-10-02 NOTE — Progress Notes (Signed)
[place]   [phone] Fax:(336) [phone]   Clinic Follow up Note   Patient Care Team: Charlott Rakes, MD as PCP - General (Family Medicine) 10/03/2018  CHIEF COMPLAINT: F/u recurrent DVT/ PE, iron deficient anemia    CURRENT THERAPY  Coumadin, oral iron pills, IV feraheme as needed   INTERVAL HISTORY: Donna [name] is a 48 y.o. female who is here for follow-up. Today, she is here with a friend and is doing well. She has been feeling fatigue lately and believes it is because of low iron. She denies any past complications due to IV iron. She still experiences heavy menstrual bleeding and will contact her gynecologist for treatment options.   Pertinent positives and negatives of review of systems are listed and detailed within the above HPI.  REVIEW OF SYSTEMS:  Constitutional: Denies fevers, chills or abnormal weight loss, (+) fatigue  Eyes: Denies blurriness of vision Ears, nose, mouth, throat, and face: Denies mucositis or sore throat Respiratory: Denies cough, dyspnea or wheezes Cardiovascular: Denies palpitation, chest discomfort or lower extremity swelling Gastrointestinal:  Denies nausea, heartburn or change in bowel habits Skin: Denies abnormal skin rashes Lymphatics: Denies new lymphadenopathy or easy bruising Neurological:Denies numbness, tingling or new weaknesses Behavioral/Psych: Mood is stable, no new changes  All other systems were reviewed with the patient and are negative.  MEDICAL HISTORY:  Past Medical History:  Diagnosis Date  . DVT (deep venous thrombosis) ([place]) 12/06/2012  . Lung collapse   . Pulmonary embolism, blood-clot, obstetric     SURGICAL HISTORY: Past Surgical History:  Procedure Laterality Date  . RIB RESECTION      I have reviewed the social history and family history with the patient and they are unchanged from previous note.  ALLERGIES:  has No Known Allergies.  MEDICATIONS:  Current Outpatient  Medications  Medication Sig Dispense Refill  . atorvastatin (LIPITOR) 20 MG tablet Take 1 tablet (20 mg total) by mouth daily. 30 tablet 3  . ferrous sulfate 325 (65 FE) MG tablet One tablet orally twice daily with a meal 60 tablet 2  . warfarin (COUMADIN) 5 MG tablet Take 2 tablets by mouth daily. Make appointment with Lurena Joiner for more refills. 60 tablet 0   No current facility-administered medications for this visit.     PHYSICAL EXAMINATION: ECOG PERFORMANCE STATUS: 0 - Asymptomatic  Vitals:   10/03/18 1527  BP: 115/77  Pulse: 93  Resp: 20  Temp: 98 F (36.7 C)  SpO2: 100%   Filed Weights   10/03/18 1527  Weight: 282 lb 12.8 oz (128.3 kg)    GENERAL:alert, no distress and comfortable SKIN: skin color, texture, turgor are normal, no rashes or significant lesions EYES: normal, Conjunctiva are pink and non-injected, sclera clear OROPHARYNX:no exudate, no erythema and lips, buccal mucosa, and tongue normal  NECK: supple, thyroid normal size, non-tender, without nodularity LYMPH:  no palpable lymphadenopathy in the cervical, axillary or inguinal LUNGS: clear to auscultation and percussion with normal breathing effort HEART: regular rate & rhythm and no murmurs and no lower extremity edema ABDOMEN:abdomen soft, non-tender and normal bowel sounds Musculoskeletal:no cyanosis of digits and no clubbing  NEURO: alert & oriented x 3 with fluent speech, no focal motor/sensory deficits  LABORATORY DATA:  I have reviewed the data as listed CBC Latest Ref Rng & Units 10/03/2018 06/17/2018 04/26/2018  WBC 4.0 - 10.5 K/uL 6.0 5.1 6.0  Hemoglobin 12.0 - 15.0 g/dL 11.7(L) 13.1 10.6(L)  Hematocrit 36.0 - 46.0 % 37.8  40.9 33.9(L)  Platelets 150 - 400 K/uL 361 284 336     CMP Latest Ref Rng & Units 06/17/2018 04/17/2018 11/15/2017  Glucose 70 - 99 mg/dL 103(H) 103(H) 93  BUN 6 - 20 mg/dL 12 12 12   Creatinine 0.44 - 1.00 mg/dL 0.89 0.95 0.85  Sodium 135 - 145 mmol/L 139 139 136  Potassium 3.5  - 5.1 mmol/L 3.8 4.3 3.9  Chloride 98 - 111 mmol/L 107 103 107  CO2 22 - 32 mmol/L 24 22 21(L)  Calcium 8.9 - 10.3 mg/dL 8.3(L) 8.8 8.8  Total Protein 6.5 - 8.1 g/dL 6.6 6.3 6.8  Total Bilirubin 0.3 - 1.2 mg/dL 0.3 <0.2 <0.2(L)  Alkaline Phos 38 - 126 U/L 71 58 64  AST 15 - 41 U/L 12(L) 11 15  ALT 0 - 44 U/L 14 11 15       RADIOGRAPHIC STUDIES: I have personally reviewed the radiological images as listed and agreed with the findings in the report. No results found.   ASSESSMENT & PLAN:  Donna [name] is a 48 y.o. female with history of  1. Anemia of iron deficiency, likely secondary to menorrhagia -She had fairly low ferritin and iron level, all consistent with iron deficiency. -She previously  had good response to oral iron supplement in the past, but she is not very complaint, and has been receiving IV feraheme every 2-3 months. She responded well, anemia resolved afterwards. -IV Feraheme if ferritin < 50 -I again encouraged her to continue her oral iron, she is taking it twice daily.  -She last received IV feraheme on 07/05/2018 - Labs reviewed, CBC WNL except Hg slightly low at 11.7 and RDW slightly elevated at 15.6  - She has very heavy menstrual cycles and we discussed different treatment options that she can discuss with her gynecologist.  Due to her history of recurrent thrombosis, birth control pill is not a good option for her. - She has been feeling fatigue lately and became mildly anemic again, likely from iron deficiency, I will schedule her iv feraheme   2.  Recurrent DVT/PE, (+) lupus anticoagulant --She had provoked DVT and PE in 2014, and second episode off on provoked TE in December 2015 after she came off Coumadin. --Her PTT Lupus anticoagulant was posiitve on 12/05/2012 and decreased Protein C levels.  Her repeated lupus anticoagulant is negative without detection of a lupus anticoagulant. Hypercoagulopathy workup was negative when she was off Coumadin. -I  previously recommended anticoagulation for life long if there is no contraindication such as severe bleeding. She could not tolerate Xarelto.  - We previously discussed preventive strategies like avoid  cigarette smoking, contraceptives, and wear compression stocks on Distance Travel -she is tolerating Coumadin well, we'll continue and follow up with her PCP  - Her last INR was on 09/12/2018 at 2.4, therapeutic   3. Obesity.  -Continued dieting and exercises per her PCP.    4. Elevated Cholesterol - On Lipitor  - F/u PCP    Plan:  -Continue coumadin and oral iron as prescribed, she follows her PCP for Coumadin monitoring - Iv feraheme infusion in 1 and 2 weeks  - Lab every 3 month  - F/u in 9 months     No problem-specific Assessment & Plan notes found for this encounter.   No orders of the defined types were placed in this encounter.  All questions were answered. The patient knows to call the clinic with any problems, questions or concerns. No barriers to learning  was detected. I spent 15 minutes counseling the patient face to face. The total time spent in the appointment was 20 minutes and more than 50% was on counseling and review of test results  I, Manson Allan am acting as scribe for Dr. Truitt Merle.  I have reviewed the above documentation for accuracy and completeness, and I agree with the above.     Truitt Merle, MD 10/03/2018

## 2018-10-03 ENCOUNTER — Inpatient Hospital Stay (HOSPITAL_BASED_OUTPATIENT_CLINIC_OR_DEPARTMENT_OTHER): Payer: Self-pay | Admitting: Hematology

## 2018-10-03 ENCOUNTER — Encounter: Payer: Self-pay | Admitting: Hematology

## 2018-10-03 ENCOUNTER — Telehealth: Payer: Self-pay | Admitting: Hematology

## 2018-10-03 ENCOUNTER — Inpatient Hospital Stay: Payer: Self-pay | Attending: Hematology

## 2018-10-03 ENCOUNTER — Other Ambulatory Visit: Payer: Self-pay

## 2018-10-03 ENCOUNTER — Telehealth: Payer: Self-pay

## 2018-10-03 VITALS — BP 115/77 | HR 93 | Temp 98.0°F | Resp 20 | Ht 70.0 in | Wt 282.8 lb

## 2018-10-03 DIAGNOSIS — Z7901 Long term (current) use of anticoagulants: Secondary | ICD-10-CM

## 2018-10-03 DIAGNOSIS — N920 Excessive and frequent menstruation with regular cycle: Secondary | ICD-10-CM

## 2018-10-03 DIAGNOSIS — D509 Iron deficiency anemia, unspecified: Secondary | ICD-10-CM | POA: Insufficient documentation

## 2018-10-03 DIAGNOSIS — Z79899 Other long term (current) drug therapy: Secondary | ICD-10-CM | POA: Insufficient documentation

## 2018-10-03 DIAGNOSIS — R5383 Other fatigue: Secondary | ICD-10-CM | POA: Insufficient documentation

## 2018-10-03 DIAGNOSIS — D6862 Lupus anticoagulant syndrome: Secondary | ICD-10-CM

## 2018-10-03 DIAGNOSIS — Z86718 Personal history of other venous thrombosis and embolism: Secondary | ICD-10-CM | POA: Insufficient documentation

## 2018-10-03 DIAGNOSIS — E669 Obesity, unspecified: Secondary | ICD-10-CM | POA: Insufficient documentation

## 2018-10-03 DIAGNOSIS — Z86711 Personal history of pulmonary embolism: Secondary | ICD-10-CM | POA: Insufficient documentation

## 2018-10-03 LAB — CBC WITH DIFFERENTIAL/PLATELET
ABS IMMATURE GRANULOCYTES: 0.02 10*3/uL (ref 0.00–0.07)
Basophils Absolute: 0.1 10*3/uL (ref 0.0–0.1)
Basophils Relative: 1 %
EOS PCT: 2 %
Eosinophils Absolute: 0.1 10*3/uL (ref 0.0–0.5)
HEMATOCRIT: 37.8 % (ref 36.0–46.0)
HEMOGLOBIN: 11.7 g/dL — AB (ref 12.0–15.0)
Immature Granulocytes: 0 %
LYMPHS PCT: 35 %
Lymphs Abs: 2.1 10*3/uL (ref 0.7–4.0)
MCH: 27 pg (ref 26.0–34.0)
MCHC: 31 g/dL (ref 30.0–36.0)
MCV: 87.3 fL (ref 80.0–100.0)
MONO ABS: 0.6 10*3/uL (ref 0.1–1.0)
MONOS PCT: 10 %
Neutro Abs: 3.1 10*3/uL (ref 1.7–7.7)
Neutrophils Relative %: 52 %
Platelets: 361 10*3/uL (ref 150–400)
RBC: 4.33 MIL/uL (ref 3.87–5.11)
RDW: 15.6 % — ABNORMAL HIGH (ref 11.5–15.5)
WBC: 6 10*3/uL (ref 4.0–10.5)
nRBC: 0 % (ref 0.0–0.2)

## 2018-10-03 NOTE — Telephone Encounter (Signed)
Scheduled per los, declined printout  ° °

## 2018-10-03 NOTE — Telephone Encounter (Signed)
Attempted to contact patient to see if we could move her appointments for today from this afternoon to this morning, call went directly to voice mail and then her mail box was full.

## 2018-10-04 ENCOUNTER — Telehealth: Payer: Self-pay

## 2018-10-04 LAB — IRON AND TIBC
IRON: 26 ug/dL — AB (ref 41–142)
Saturation Ratios: 8 % — ABNORMAL LOW (ref 21–57)
TIBC: 324 ug/dL (ref 236–444)
UIBC: 298 ug/dL (ref 120–384)

## 2018-10-04 LAB — FERRITIN: Ferritin: 9 ng/mL — ABNORMAL LOW (ref 11–307)

## 2018-10-04 NOTE — Telephone Encounter (Signed)
Spoke with patient regarding lab results, per Dr. Burr Medico iron level is very low, will need IV fereheme twice which are already scheduled. She verbalized an understanding.

## 2018-10-04 NOTE — Telephone Encounter (Signed)
-----   Message from Truitt Merle, MD sent at 10/04/2018 12:06 PM EDT ----- Please let her know her iron level is very low and she will need iv fereheme twice, which are scheduled. Thanks   Truitt Merle  10/04/2018

## 2018-10-11 ENCOUNTER — Inpatient Hospital Stay: Payer: No Typology Code available for payment source

## 2018-10-11 ENCOUNTER — Other Ambulatory Visit: Payer: Self-pay

## 2018-10-11 ENCOUNTER — Ambulatory Visit: Payer: Self-pay | Attending: Family Medicine | Admitting: Pharmacist

## 2018-10-11 ENCOUNTER — Encounter: Payer: No Typology Code available for payment source | Admitting: Pharmacist

## 2018-10-11 VITALS — BP 115/78 | HR 89 | Temp 98.2°F | Resp 18

## 2018-10-11 DIAGNOSIS — Z7901 Long term (current) use of anticoagulants: Secondary | ICD-10-CM

## 2018-10-11 DIAGNOSIS — D509 Iron deficiency anemia, unspecified: Secondary | ICD-10-CM

## 2018-10-11 LAB — POCT INR: INR: 3.4 — AB (ref 2.0–3.0)

## 2018-10-11 MED ORDER — SODIUM CHLORIDE 0.9 % IV SOLN
510.0000 mg | Freq: Once | INTRAVENOUS | Status: AC
  Administered 2018-10-11: 510 mg via INTRAVENOUS
  Filled 2018-10-11: qty 17

## 2018-10-11 MED ORDER — SODIUM CHLORIDE 0.9 % IV SOLN
Freq: Once | INTRAVENOUS | Status: AC
  Administered 2018-10-11: 15:00:00 via INTRAVENOUS
  Filled 2018-10-11: qty 250

## 2018-10-11 NOTE — Patient Instructions (Signed)
Coronavirus (COVID-19) Are you at risk?  Are you at risk for the Coronavirus (COVID-19)?  To be considered HIGH RISK for Coronavirus (COVID-19), you have to meet the following criteria:  . Traveled to [place], [place], [place], [place] or [place]; or in the [place] to [place], [place], [place], or [place]; and have fever, cough, and shortness of breath within the last 2 weeks of travel OR . Been in close contact with a person diagnosed with COVID-19 within the last 2 weeks and have fever, cough, and shortness of breath . IF YOU DO NOT MEET THESE CRITERIA, YOU ARE CONSIDERED LOW RISK FOR COVID-19.  What to do if you are HIGH RISK for COVID-19?  . If you are having a medical emergency, call 911. . Seek medical care right away. Before you go to a doctor's office, urgent care or emergency department, call ahead and tell them about your recent travel, contact with someone diagnosed with COVID-19, and your symptoms. You should receive instructions from your physician's office regarding next steps of care.  . When you arrive at healthcare provider, tell the healthcare staff immediately you have returned from visiting [place], [place], [place], [place] or [place]; or traveled in the [place] to [place], [place], [place], or [place]; in the last two weeks or you have been in close contact with a person diagnosed with COVID-19 in the last 2 weeks.   . Tell the health care staff about your symptoms: fever, cough and shortness of breath. . After you have been seen by a medical provider, you will be either: o Tested for (COVID-19) and discharged home on quarantine except to seek medical care if symptoms worsen, and asked to  - Stay home and avoid contact with others until you get your results (4-5 days)  - Avoid travel on public transportation if possible (such as bus, train, or airplane) or o Sent to the Emergency Department by EMS for evaluation, COVID-19 testing, and possible  admission depending on your condition and test results.  What to do if you are LOW RISK for COVID-19?  Reduce your risk of any infection by using the same precautions used for avoiding the common cold or flu:  . Wash your hands often with soap and warm water for at least 20 seconds.  If soap and water are not readily available, use an alcohol-based hand sanitizer with at least 60% alcohol.  . If coughing or sneezing, cover your mouth and nose by coughing or sneezing into the elbow areas of your shirt or coat, into a tissue or into your sleeve (not your hands). . Avoid shaking hands with others and consider head nods or verbal greetings only. . Avoid touching your eyes, nose, or mouth with unwashed hands.  . Avoid close contact with people who are sick. . Avoid places or events with large numbers of people in one location, like concerts or sporting events. . Carefully consider travel plans you have or are making. . If you are planning any travel outside or inside the [place], visit the CDC's Travelers' Health webpage for the latest health notices. . If you have some symptoms but not all symptoms, continue to monitor at home and seek medical attention if your symptoms worsen. . If you are having a medical emergency, call 911.  ADDITIONAL HEALTHCARE OPTIONS FOR PATIENTS  [facility] / e-Visit: [email or web address]         [facility]: [phone]  [facility]: [phone]                   [facility]: [phone]   Ferumoxytol injection What is this medicine? FERUMOXYTOL is an iron complex. Iron is used to make healthy red blood cells, which carry oxygen and nutrients throughout the body. This medicine is used to treat iron deficiency anemia. This medicine may be used for other purposes; ask your health care provider or pharmacist if you have questions. COMMON BRAND NAME(S): Feraheme What should I  tell my health care provider before I take this medicine? They need to know if you have any of these conditions: -anemia not caused by low iron levels -high levels of iron in the blood -magnetic resonance imaging (MRI) test scheduled -an unusual or allergic reaction to iron, other medicines, foods, dyes, or preservatives -pregnant or trying to get pregnant -breast-feeding How should I use this medicine? This medicine is for injection into a vein. It is given by a health care professional in a hospital or clinic setting. Talk to your pediatrician regarding the use of this medicine in children. Special care may be needed. Overdosage: If you think you have taken too much of this medicine contact a poison control center or emergency room at once. NOTE: This medicine is only for you. Do not share this medicine with others. What if I miss a dose? It is important not to miss your dose. Call your doctor or health care professional if you are unable to keep an appointment. What may interact with this medicine? This medicine may interact with the following medications: -other iron products This list may not describe all possible interactions. Give your health care provider a list of all the medicines, herbs, non-prescription drugs, or dietary supplements you use. Also tell them if you smoke, drink alcohol, or use illegal drugs. Some items may interact with your medicine. What should I watch for while using this medicine? Visit your doctor or healthcare professional regularly. Tell your doctor or healthcare professional if your symptoms do not start to get better or if they get worse. You may need blood work done while you are taking this medicine. You may need to follow a special diet. Talk to your doctor. Foods that contain iron include: whole grains/cereals, dried fruits, beans, or peas, leafy green vegetables, and organ meats (liver, kidney). What side effects may I notice from receiving this  medicine? Side effects that you should report to your doctor or health care professional as soon as possible: -allergic reactions like skin rash, itching or hives, swelling of the face, lips, or tongue -breathing problems -changes in blood pressure -feeling faint or lightheaded, falls -fever or chills -flushing, sweating, or hot feelings -swelling of the ankles or feet Side effects that usually do not require medical attention (report to your doctor or health care professional if they continue or are bothersome): -diarrhea -headache -nausea, vomiting -stomach pain This list may not describe all possible side effects. Call your doctor for medical advice about side effects. You may report side effects to FDA at 1-800-FDA-1088. Where should I keep my medicine? This drug is given in a hospital or clinic and will not be stored at home. NOTE: This sheet is a summary. It may not cover all possible information. If you have questions about this medicine, talk to your doctor, pharmacist, or health care provider.  2019 Elsevier/Gold Standard (2016-08-21 20:21:10)  

## 2018-10-15 ENCOUNTER — Other Ambulatory Visit: Payer: Self-pay | Admitting: Family Medicine

## 2018-10-16 MED FILL — WARFARIN SODIUM 5 MG TABLET: 5 | 30 days supply | Qty: 60 | Fill #0

## 2018-10-17 ENCOUNTER — Inpatient Hospital Stay: Payer: Self-pay | Attending: Hematology

## 2018-10-17 ENCOUNTER — Other Ambulatory Visit: Payer: Self-pay

## 2018-10-17 VITALS — BP 102/72 | HR 92 | Temp 98.5°F | Resp 18

## 2018-10-17 DIAGNOSIS — D509 Iron deficiency anemia, unspecified: Secondary | ICD-10-CM | POA: Insufficient documentation

## 2018-10-17 MED ORDER — SODIUM CHLORIDE 0.9 % IV SOLN
INTRAVENOUS | Status: DC
  Administered 2018-10-17: 15:00:00 via INTRAVENOUS
  Filled 2018-10-17: qty 250

## 2018-10-17 MED ORDER — SODIUM CHLORIDE 0.9 % IV SOLN
510.0000 mg | Freq: Once | INTRAVENOUS | Status: AC
  Administered 2018-10-17: 15:00:00 510 mg via INTRAVENOUS
  Filled 2018-10-17: qty 17

## 2018-10-17 NOTE — Progress Notes (Signed)
Patient declined to remain for 30 minute post observation period. Vital signs taken. No complaints.

## 2018-10-17 NOTE — Patient Instructions (Signed)

## 2018-10-18 MED FILL — WARFARIN SODIUM 5 MG TABLET: 5 | 30 days supply | Qty: 60 | Fill #1

## 2018-10-24 ENCOUNTER — Ambulatory Visit: Payer: Self-pay | Attending: Family Medicine | Admitting: Pharmacist

## 2018-10-24 ENCOUNTER — Other Ambulatory Visit: Payer: Self-pay

## 2018-10-24 DIAGNOSIS — Z7901 Long term (current) use of anticoagulants: Secondary | ICD-10-CM

## 2018-10-24 LAB — POCT INR: INR: 1.5 — AB (ref 2.0–3.0)

## 2018-11-07 ENCOUNTER — Ambulatory Visit: Payer: Self-pay | Attending: Family Medicine | Admitting: Pharmacist

## 2018-11-07 ENCOUNTER — Other Ambulatory Visit: Payer: Self-pay

## 2018-11-07 DIAGNOSIS — Z7901 Long term (current) use of anticoagulants: Secondary | ICD-10-CM

## 2018-11-07 LAB — POCT INR: INR: 2 (ref 2.0–3.0)

## 2018-12-10 ENCOUNTER — Other Ambulatory Visit: Payer: Self-pay

## 2018-12-10 ENCOUNTER — Ambulatory Visit: Payer: Self-pay | Attending: Family Medicine | Admitting: Pharmacist

## 2018-12-10 DIAGNOSIS — Z7901 Long term (current) use of anticoagulants: Secondary | ICD-10-CM

## 2018-12-10 LAB — POCT INR: INR: 2.8 (ref 2.0–3.0)

## 2018-12-23 ENCOUNTER — Emergency Department (HOSPITAL_BASED_OUTPATIENT_CLINIC_OR_DEPARTMENT_OTHER)
Admission: EM | Admit: 2018-12-23 | Discharge: 2018-12-24 | Disposition: A | Discharge status: Home / Self Care | Payer: Self-pay | Attending: Emergency Medicine | Admitting: Emergency Medicine

## 2018-12-23 ENCOUNTER — Other Ambulatory Visit: Payer: Self-pay

## 2018-12-23 ENCOUNTER — Encounter (HOSPITAL_BASED_OUTPATIENT_CLINIC_OR_DEPARTMENT_OTHER): Payer: Self-pay

## 2018-12-23 ENCOUNTER — Emergency Department (HOSPITAL_BASED_OUTPATIENT_CLINIC_OR_DEPARTMENT_OTHER): Payer: Self-pay

## 2018-12-23 DIAGNOSIS — Z79899 Other long term (current) drug therapy: Secondary | ICD-10-CM | POA: Insufficient documentation

## 2018-12-23 DIAGNOSIS — R1084 Generalized abdominal pain: Secondary | ICD-10-CM | POA: Insufficient documentation

## 2018-12-23 DIAGNOSIS — D219 Benign neoplasm of connective and other soft tissue, unspecified: Secondary | ICD-10-CM

## 2018-12-23 DIAGNOSIS — Z7901 Long term (current) use of anticoagulants: Secondary | ICD-10-CM | POA: Insufficient documentation

## 2018-12-23 DIAGNOSIS — D259 Leiomyoma of uterus, unspecified: Secondary | ICD-10-CM | POA: Insufficient documentation

## 2018-12-23 LAB — CBC WITH DIFFERENTIAL/PLATELET
Abs Immature Granulocytes: 0.03 10*3/uL (ref 0.00–0.07)
Basophils Absolute: 0 10*3/uL (ref 0.0–0.1)
Basophils Relative: 0 %
Eosinophils Absolute: 0.1 10*3/uL (ref 0.0–0.5)
Eosinophils Relative: 1 %
HCT: 36.8 % (ref 36.0–46.0)
Hemoglobin: 11.7 g/dL — ABNORMAL LOW (ref 12.0–15.0)
Immature Granulocytes: 0 %
Lymphocytes Relative: 19 %
Lymphs Abs: 2.1 10*3/uL (ref 0.7–4.0)
MCH: 27.7 pg (ref 26.0–34.0)
MCHC: 31.8 g/dL (ref 30.0–36.0)
MCV: 87.2 fL (ref 80.0–100.0)
Monocytes Absolute: 1.2 10*3/uL — ABNORMAL HIGH (ref 0.1–1.0)
Monocytes Relative: 11 %
Neutro Abs: 7.5 10*3/uL (ref 1.7–7.7)
Neutrophils Relative %: 69 %
Platelets: 366 10*3/uL (ref 150–400)
RBC: 4.22 MIL/uL (ref 3.87–5.11)
RDW: 18.5 % — ABNORMAL HIGH (ref 11.5–15.5)
WBC: 11 10*3/uL — ABNORMAL HIGH (ref 4.0–10.5)
nRBC: 0 % (ref 0.0–0.2)

## 2018-12-23 LAB — URINALYSIS, ROUTINE W REFLEX MICROSCOPIC
Bilirubin Urine: NEGATIVE
Glucose, UA: NEGATIVE mg/dL
Ketones, ur: NEGATIVE mg/dL
Nitrite: NEGATIVE
Protein, ur: NEGATIVE mg/dL
Specific Gravity, Urine: 1.015 (ref 1.005–1.030)
pH: 6 (ref 5.0–8.0)

## 2018-12-23 LAB — COMPREHENSIVE METABOLIC PANEL
ALT: 15 U/L (ref 0–44)
AST: 14 U/L — ABNORMAL LOW (ref 15–41)
Albumin: 2.9 g/dL — ABNORMAL LOW (ref 3.5–5.0)
Alkaline Phosphatase: 69 U/L (ref 38–126)
Anion gap: 9 (ref 5–15)
BUN: 7 mg/dL (ref 6–20)
CO2: 22 mmol/L (ref 22–32)
Calcium: 8.4 mg/dL — ABNORMAL LOW (ref 8.9–10.3)
Chloride: 103 mmol/L (ref 98–111)
Creatinine, Ser: 0.96 mg/dL (ref 0.44–1.00)
GFR calc Af Amer: >60 mL/min (ref >60)
GFR calc non Af Amer: >60 mL/min (ref >60)
Glucose, Bld: 114 mg/dL — ABNORMAL HIGH (ref 70–99)
Potassium: 3.8 mmol/L (ref 3.5–5.1)
Sodium: 134 mmol/L — ABNORMAL LOW (ref 135–145)
Total Bilirubin: 0.5 mg/dL (ref 0.3–1.2)
Total Protein: 7.4 g/dL (ref 6.5–8.1)

## 2018-12-23 LAB — PREGNANCY, URINE: Preg Test, Ur: NEGATIVE

## 2018-12-23 LAB — URINALYSIS, MICROSCOPIC (REFLEX): RBC / HPF: >50 RBC/hpf (ref 0–5)

## 2018-12-23 LAB — PROTIME-INR
INR: 1.9 — ABNORMAL HIGH (ref 0.8–1.2)
Prothrombin Time: 21.4 seconds — ABNORMAL HIGH (ref 11.4–15.2)

## 2018-12-23 LAB — LIPASE, BLOOD: Lipase: 35 U/L (ref 11–51)

## 2018-12-23 MED ORDER — IOHEXOL 300 MG/ML  SOLN
100.0000 mL | Freq: Once | INTRAMUSCULAR | Status: AC | PRN
  Administered 2018-12-23: 100 mL via INTRAVENOUS

## 2018-12-23 MED ORDER — SODIUM CHLORIDE 0.9 % IV BOLUS
1000.0000 mL | Freq: Once | INTRAVENOUS | Status: AC
  Administered 2018-12-23: 1000 mL via INTRAVENOUS

## 2018-12-23 MED ORDER — ONDANSETRON HCL 4 MG/2ML IJ SOLN
4.0000 mg | Freq: Once | INTRAMUSCULAR | Status: AC
  Administered 2018-12-23: 4 mg via INTRAVENOUS
  Filled 2018-12-23: qty 2

## 2018-12-23 MED ORDER — MORPHINE SULFATE (PF) 4 MG/ML IV SOLN
4.0000 mg | Freq: Once | INTRAVENOUS | Status: AC
  Administered 2018-12-23: 4 mg via INTRAVENOUS
  Filled 2018-12-23: qty 1

## 2018-12-23 NOTE — ED Provider Notes (Signed)
[facility] EMERGENCY DEPARTMENT Provider Note   CSN: [id number] Arrival date & time: 12/23/18  2142    History   Chief Complaint Chief Complaint  Patient presents with  . Abdominal Pain    [name] is a 48 y.o. female.     48 yo F with a chief complaint of diffuse abdominal pain.  Described as sharp and shooting.  Worse with movement palpation or twisting.  Feels that she has a significant stretched abdomen.  Radiates down into her buttock.  Having some pelvic cramping that she describes as her normal menses though lasting longer than typical.  Having increased urinary frequency without dysuria hesitancy.  Denies flank pain.  Denies nausea or vomiting denies fever denies diarrhea or constipation.  Denies prior abdominal surgery.  The history is provided by the patient.  Abdominal Pain  Pain location:  Generalized Pain quality: aching, sharp and shooting   Pain radiates to:  Does not radiate Pain severity:  Moderate Onset quality:  Sudden Duration:  6 days Timing:  Constant Progression:  Worsening Chronicity:  New Relieved by:  Nothing Worsened by:  Nothing Ineffective treatments:  None tried Associated symptoms: no chest pain, no chills, no constipation, no dysuria, no fever, no nausea, no shortness of breath and no vomiting     Past Medical History:  Diagnosis Date  . DVT (deep venous thrombosis) ([place]) 12/06/2012  . Lung collapse   . Pulmonary embolism, blood-clot, obstetric     Patient Active Problem List   Diagnosis Date Noted  . Hyperlipidemia 10/17/2017  . Hypercoagulable state ([place]) 09/11/2017  . Personal history of venous thrombosis and embolism 08/01/2015  . Venous thrombosis 08/12/2014  . 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 Iron deficiency anemia 07/16/2014  . Pleuritic chest pain 07/16/2014  . High risk medication use 02/12/2013  . Chronic anticoagulation 02/12/2013  . Healthcare maintenance 02/12/2013  . Obesity  12/30/2012  . Menorrhagia 12/16/2012  . Pulmonary embolism ([place]) 12/05/2012  . Anemia 12/05/2012    Past Surgical History:  Procedure Laterality Date  . RIB RESECTION       OB History    Gravida  1   Para  1   Term  1   Preterm  0   AB  0   Living  1     SAB  0   TAB  0   Ectopic  0   Multiple  0   Live Births               Home Medications    Prior to Admission medications   Medication Sig Start Date End Date Taking? Authorizing Provider  atorvastatin (LIPITOR) 20 MG tablet Take 1 tablet (20 mg total) by mouth daily. 04/18/18   Charlott Rakes, MD  ferrous sulfate 325 (65 FE) MG tablet One tablet orally twice daily with a meal 01/12/16   Truitt Merle, MD  warfarin (COUMADIN) 5 MG tablet Take 2 tablets by mouth daily. 10/15/18   Charlott Rakes, MD    Family History Family History  Problem Relation Age of Onset  . COPD Father   . Stroke Father     Social History Social History   Tobacco Use  . Smoking status: Never Smoker  . Smokeless tobacco: Never Used  Substance Use Topics  . Alcohol use: Yes    Comment: socially   . Drug use: No     Allergies   Patient has no known allergies.  Review of Systems Review of Systems  Constitutional: Negative for chills and fever.  HENT: Negative for congestion and rhinorrhea.   Eyes: Negative for redness and visual disturbance.  Respiratory: Negative for shortness of breath and wheezing.   Cardiovascular: Negative for chest pain and palpitations.  Gastrointestinal: Positive for abdominal distention and abdominal pain. Negative for constipation, nausea and vomiting.  Genitourinary: Negative for dysuria and urgency.  Musculoskeletal: Negative for arthralgias and myalgias.  Skin: Negative for pallor and wound.  Neurological: Negative for dizziness and headaches.     Physical Exam Updated Vital Signs BP (!) 114/102 (BP Location: Right Arm)   Pulse (!) 115   Temp 99 F (37.2 C) (Oral)   Resp 20    Ht 5\' 10"  (1.778 m)   Wt (!) 147.4 kg   LMP 12/17/2018   SpO2 98%   BMI 46.63 kg/m   Physical Exam Vitals signs and nursing note reviewed.  Constitutional:      General: She is not in acute distress.    Appearance: She is well-developed. She is obese. She is not diaphoretic.  HENT:     Head: Normocephalic and atraumatic.  Eyes:     Pupils: Pupils are equal, round, and reactive to light.  Neck:     Musculoskeletal: Normal range of motion and neck supple.  Cardiovascular:     Rate and Rhythm: Normal rate and regular rhythm.     Heart sounds: No murmur. No friction rub. No gallop.   Pulmonary:     Effort: Pulmonary effort is normal.     Breath sounds: No wheezing or rales.  Abdominal:     General: There is distension.     Palpations: Abdomen is soft.     Tenderness: There is generalized abdominal tenderness.     Comments: Abdomen is tympanitic to percussion.  Diffusely tender on palpation.  Musculoskeletal:        General: No tenderness.  Skin:    General: Skin is warm and dry.  Neurological:     Mental Status: She is alert and oriented to person, place, and time.  Psychiatric:        Behavior: Behavior normal.      ED Treatments / Results  Labs (all labs ordered are listed, but only abnormal results are displayed) Labs Reviewed  URINALYSIS, ROUTINE W REFLEX MICROSCOPIC - Abnormal; Notable for the following components:      Result Value   APPearance CLOUDY (*)    Hgb urine dipstick LARGE (*)    Leukocytes,Ua TRACE (*)    All other components within normal limits  CBC WITH DIFFERENTIAL/PLATELET - Abnormal; Notable for the following components:   WBC 11.0 (*)    Hemoglobin 11.7 (*)    RDW 18.5 (*)    Monocytes Absolute 1.2 (*)    All other components within normal limits  COMPREHENSIVE METABOLIC PANEL - Abnormal; Notable for the following components:   Sodium 134 (*)    Glucose, Bld 114 (*)    Calcium 8.4 (*)    Albumin 2.9 (*)    AST 14 (*)    All other  components within normal limits  URINALYSIS, MICROSCOPIC (REFLEX) - Abnormal; Notable for the following components:   Bacteria, UA FEW (*)    All other components within normal limits  PREGNANCY, URINE  LIPASE, BLOOD  PROTIME-INR    EKG None  Radiology No results found.  Procedures Procedures (including critical care time)  Medications Ordered in ED Medications  sodium chloride 0.9 %  bolus 1,000 mL (1,000 mLs Intravenous New Bag/Given 12/23/18 2220)  morphine 4 MG/ML injection 4 mg (4 mg Intravenous Given 12/23/18 2225)  ondansetron (ZOFRAN) injection 4 mg (4 mg Intravenous Given 12/23/18 2224)  iohexol (OMNIPAQUE) 300 MG/ML solution 100 mL (100 mLs Intravenous Contrast Given 12/23/18 2251)     Initial Impression / Assessment and Plan / ED Course  I have reviewed the triage vital signs and the nursing notes.  Pertinent labs & imaging results that were available during my care of the patient were reviewed by me and considered in my medical decision making (see chart for details).        48 yo F with a chief complaint of diffuse abdominal tenderness.  She does have some pretty significant abdominal tenderness and has distention without a fluid wave.  Will obtain a CT scan.  Lab work with a mild leukocytosis urine is likely contaminated with menstrual blood, patient is not pregnant.  Awaiting CT.  Signed out to Dr. Randal Buba please see their note for further details of care.   The patients results and plan were reviewed and discussed.   Any x-rays performed were independently reviewed by myself.   Differential diagnosis were considered with the presenting HPI.  Medications  sodium chloride 0.9 % bolus 1,000 mL (1,000 mLs Intravenous New Bag/Given 12/23/18 2220)  morphine 4 MG/ML injection 4 mg (4 mg Intravenous Given 12/23/18 2225)  ondansetron (ZOFRAN) injection 4 mg (4 mg Intravenous Given 12/23/18 2224)  iohexol (OMNIPAQUE) 300 MG/ML solution 100 mL (100 mLs Intravenous Contrast  Given 12/23/18 2251)    Vitals:   12/23/18 2153 12/23/18 2154  BP:  (!) 114/102  Pulse:  (!) 115  Resp:  20  Temp:  99 F (37.2 C)  TempSrc:  Oral  SpO2:  98%  Weight: (!) 147.4 kg   Height: 5\' 10"  (1.778 m)     Final diagnoses:  Diffuse abdominal pain      Final Clinical Impressions(s) / ED Diagnoses   Final diagnoses:  Diffuse abdominal pain    ED Discharge Orders    None       Deno Etienne, DO 12/23/18 2308

## 2018-12-23 NOTE — ED Notes (Signed)
Patient transported to CT 

## 2018-12-23 NOTE — ED Triage Notes (Signed)
Pt c/o abd pain x 6 days-urinary freq-sx started 2 days after starting a "diet tea"-NAD-steady gait

## 2018-12-25 ENCOUNTER — Other Ambulatory Visit: Payer: Self-pay

## 2018-12-25 ENCOUNTER — Emergency Department (HOSPITAL_COMMUNITY)
Admission: EM | Admit: 2018-12-25 | Discharge: 2018-12-25 | Disposition: A | Discharge status: Home / Self Care | Payer: HRSA Program | Attending: Emergency Medicine | Admitting: Emergency Medicine

## 2018-12-25 ENCOUNTER — Encounter (HOSPITAL_COMMUNITY): Payer: Self-pay | Admitting: Emergency Medicine

## 2018-12-25 DIAGNOSIS — Z20828 Contact with and (suspected) exposure to other viral communicable diseases: Secondary | ICD-10-CM | POA: Insufficient documentation

## 2018-12-25 DIAGNOSIS — Z79899 Other long term (current) drug therapy: Secondary | ICD-10-CM | POA: Insufficient documentation

## 2018-12-25 DIAGNOSIS — Z20822 Contact with and (suspected) exposure to covid-19: Secondary | ICD-10-CM

## 2018-12-25 DIAGNOSIS — Z7901 Long term (current) use of anticoagulants: Secondary | ICD-10-CM | POA: Insufficient documentation

## 2018-12-25 DIAGNOSIS — Z86711 Personal history of pulmonary embolism: Secondary | ICD-10-CM | POA: Diagnosis not present

## 2018-12-25 MED FILL — WARFARIN SODIUM 5 MG TABLET: 5 | 30 days supply | Qty: 60 | Fill #2

## 2018-12-25 NOTE — ED Notes (Signed)
Bed: [facility] Expected date:  Expected time:  Means of arrival:  Comments: 

## 2018-12-25 NOTE — Discharge Instructions (Addendum)
You were so up today for COVID-19, this results will be available to you via my chart in the next 2 days.  I will personally call you if your results are positive.  Please follow-up with your primary care physician as needed.

## 2018-12-25 NOTE — ED Provider Notes (Signed)
[facility] DEPT Provider Note   CSN: [id number] Arrival date & time: 12/25/18  2026    History   Chief Complaint Chief Complaint  Patient presents with   Corona virus test    [name] is a 48 y.o. female.     47 y.o female with a PMH of COPD, hyperlipidemia presents to the ED requesting coronavirus testing.  Patient reports she has an extensive medical history and would like to be tested at this time.  She denies any symptoms such as fever, rhinorrhea, cough, shortness of breath or chest pain.  The history is provided by the patient.    Past Medical History:  Diagnosis Date   DVT (deep venous thrombosis) ([place]) 12/06/2012   Lung collapse    Pulmonary embolism, blood-clot, obstetric     Patient Active Problem List   Diagnosis Date Noted   Hyperlipidemia 10/17/2017   Hypercoagulable state ([place]) 09/11/2017   Personal history of venous thrombosis and embolism 08/01/2015   Venous thrombosis 08/12/2014   Retro-orbital pain of right eye 07/30/2014   Iron deficiency anemia 07/16/2014   Pleuritic chest pain 07/16/2014   High risk medication use 02/12/2013   Chronic anticoagulation 02/12/2013   Healthcare maintenance 02/12/2013   Obesity 12/30/2012   Menorrhagia 12/16/2012   Pulmonary embolism ([place]) 12/05/2012   Anemia 12/05/2012    Past Surgical History:  Procedure Laterality Date   RIB RESECTION       OB History    Gravida  1   Para  1   Term  1   Preterm  0   AB  0   Living  1     SAB  0   TAB  0   Ectopic  0   Multiple  0   Live Births               Home Medications    Prior to Admission medications   Medication Sig Start Date End Date Taking? Authorizing Provider  atorvastatin (LIPITOR) 20 MG tablet Take 1 tablet (20 mg total) by mouth daily. 04/18/18   Charlott Rakes, MD  ferrous sulfate 325 (65 FE) MG tablet One tablet orally twice daily with a meal 01/12/16   Truitt Merle,  MD  warfarin (COUMADIN) 5 MG tablet Take 2 tablets by mouth daily. 10/15/18   Charlott Rakes, MD    Family History Family History  Problem Relation Age of Onset   COPD Father    Stroke Father     Social History Social History   Tobacco Use   Smoking status: Never Smoker   Smokeless tobacco: Never Used  Substance Use Topics   Alcohol use: Yes    Comment: socially    Drug use: No     Allergies   Patient has no known allergies.   Review of Systems Review of Systems  Constitutional: Negative for fever.  Respiratory: Negative for shortness of breath.   Cardiovascular: Negative for chest pain.  Gastrointestinal: Negative for abdominal pain.  Musculoskeletal: Negative for myalgias.     Physical Exam Updated Vital Signs BP 116/84 (BP Location: Left Arm)    Pulse (!) 111    Temp 98.3 F (36.8 C) (Oral)    Resp 18    Ht 5\' 10"  (1.778 m)    Wt (!) 147.4 kg    LMP 12/17/2018    SpO2 97%    BMI 46.63 kg/m   Physical Exam Vitals signs and nursing note reviewed.  Constitutional:      General: She is not in acute distress.    Appearance: She is well-developed.  HENT:     Head: Normocephalic and atraumatic.     Mouth/Throat:     Pharynx: No oropharyngeal exudate.  Eyes:     Pupils: Pupils are equal, round, and reactive to light.  Neck:     Musculoskeletal: Normal range of motion.  Cardiovascular:     Rate and Rhythm: Regular rhythm.     Heart sounds: Normal heart sounds.  Pulmonary:     Effort: Pulmonary effort is normal. No respiratory distress.     Breath sounds: Normal breath sounds.  Abdominal:     General: Bowel sounds are normal. There is no distension.     Palpations: Abdomen is soft.     Tenderness: There is no abdominal tenderness.  Musculoskeletal:        General: No tenderness or deformity.     Right lower leg: No edema.     Left lower leg: No edema.  Skin:    General: Skin is warm and dry.  Neurological:     Mental Status: She is alert and  oriented to person, place, and time.      ED Treatments / Results  Labs (all labs ordered are listed, but only abnormal results are displayed) Labs Reviewed  NOVEL CORONAVIRUS, NAA (HOSPITAL ORDER, SEND-OUT TO REF LAB)    EKG None  Radiology Ct Abdomen Pelvis W Contrast  Result Date: 12/23/2018 CLINICAL DATA:  Abdominal pain. EXAM: CT ABDOMEN AND PELVIS WITH CONTRAST TECHNIQUE: Multidetector CT imaging of the abdomen and pelvis was performed using the standard protocol following bolus administration of intravenous contrast. CONTRAST:  176mL OMNIPAQUE IOHEXOL 300 MG/ML  SOLN COMPARISON:  CT dated 02/19/2015. FINDINGS: Lower chest: No acute abnormality. Hepatobiliary: There is hepatomegaly with hepatic steatosis. The gallbladder is unremarkable. Pancreas: Unremarkable. No pancreatic ductal dilatation or surrounding inflammatory changes. Spleen: Normal in size without focal abnormality. Adrenals/Urinary Tract: Adrenal glands are unremarkable. There is no hydronephrosis. There is a punctate nonobstructing stone in lower pole of the left kidney. The bladder is unremarkable, however there is significant mass effect on the bladder from the patient's nearby uterus. Stomach/Bowel: Stomach is within normal limits. Appendix appears normal. No evidence of bowel wall thickening, distention, or inflammatory changes. Vascular/Lymphatic: No significant vascular findings are present. No enlarged abdominal or pelvic lymph nodes. Reproductive: The uterus is significantly enlarged and heterogeneous currently measuring approximately 26 cm SI x 17 cm TV (previously measuring approximately 15 cm SI by 12 cm TV). There are new low attenuation areas in the fundus measuring approximately 12 and 7 cm each. The ovaries are not well evaluated. Other: No abdominal wall hernia or abnormality. No abdominopelvic ascites. Musculoskeletal: No acute or significant osseous findings. IMPRESSION: 1. Punctate nonobstructing stone in the  lower pole the left kidney. No evidence of hydronephrosis. 2. Significant interval increase in size of the patient's bulky heterogeneous uterus with new low-attenuation areas near the uterine fundus. Overall, the uterus currently measures 26 x 17 cm (previously measuring 15 x 12 cm on 02/19/2015). The heterogeneous appearance is favored to be secondary to underlying fibroids. Outpatient gynecology follow-up is recommended. An underlying mass cannot be excluded. 3. Significant mass effect is noted on the patient's urinary bladder from the enlarged uterus. 4. Hepatic steatosis. Electronically Signed   By: Constance Holster M.D.   On: 12/23/2018 23:34    Procedures Procedures (including critical care time)  Medications Ordered in  ED Medications - No data to display   Initial Impression / Assessment and Plan / ED Course  I have reviewed the triage vital signs and the nursing notes.  Pertinent labs & imaging results that were available during my care of the patient were reviewed by me and considered in my medical decision making (see chart for details).      Patient presents to the ED requesting COVID-19 testing.  Seen in the ED 2 days ago for abdominal pain, reports she is asymptomatic today but due to her being on Coumadin she voiced concern for 19.  Patient is overall well-appearing, vital signs are stable, does have a slightly elevated heart rate, patient is currently pacing the room she reports she has got to head out.  Will recheck vital signs prior to discharge.  Otherwise well-appearing with no complaints today.  Will obtain 2-day send out test, she has been advised that this will be resulted on her my chart in the next 2 days.  I will personally call patient if this result is positive.  Return precautions provided at length.  Portions of this note were generated with [employer]. Dictation errors may occur despite best attempts at proofreading.     Donna [name] was  evaluated in Emergency Department on 12/25/2018 for the symptoms described in the history of present illness. She was evaluated in the context of the global COVID-19 pandemic, which necessitated consideration that the patient might be at risk for infection with the SARS-CoV-2 virus that causes COVID-19. Institutional protocols and algorithms that pertain to the evaluation of patients at risk for COVID-19 are in a state of rapid change based on information released by regulatory bodies including the CDC and federal and state organizations. These policies and algorithms were followed during the patient's care in the ED.   Final Clinical Impressions(s) / ED Diagnoses   Final diagnoses:  Suspected Covid-19 Virus Infection    ED Discharge Orders    None       Janeece Fitting, Hershal Coria 12/25/18 2224    Dorie Rank, MD 12/26/18 [phone]

## 2018-12-25 NOTE — ED Triage Notes (Signed)
Patient is not having any symptoms. Patient wants corona virus test.

## 2018-12-26 MED FILL — ATORVASTATIN 20 MG TABLET: 20 | 30 days supply | Qty: 30 | Fill #2

## 2018-12-27 LAB — NOVEL CORONAVIRUS, NAA (HOSP ORDER, SEND-OUT TO REF LAB; TAT 18-24 HRS): SARS-CoV-2, NAA: NOT DETECTED

## 2019-01-03 ENCOUNTER — Telehealth: Payer: Self-pay

## 2019-01-03 NOTE — Telephone Encounter (Signed)
Called and left voicemail regarding pre-screening questions for appt on 6/22  

## 2019-01-06 ENCOUNTER — Inpatient Hospital Stay: Payer: Self-pay | Attending: Hematology

## 2019-01-14 ENCOUNTER — Ambulatory Visit: Payer: Self-pay | Attending: Family Medicine | Admitting: Pharmacist

## 2019-01-14 ENCOUNTER — Other Ambulatory Visit: Payer: Self-pay

## 2019-01-14 DIAGNOSIS — Z7901 Long term (current) use of anticoagulants: Secondary | ICD-10-CM

## 2019-01-14 LAB — POCT INR: INR: 2.4 (ref 2.0–3.0)

## 2019-01-20 ENCOUNTER — Encounter: Payer: Self-pay | Admitting: Obstetrics & Gynecology

## 2019-01-20 ENCOUNTER — Ambulatory Visit: Payer: Self-pay | Admitting: Obstetrics & Gynecology

## 2019-01-20 ENCOUNTER — Other Ambulatory Visit: Payer: Self-pay

## 2019-01-20 DIAGNOSIS — D259 Leiomyoma of uterus, unspecified: Secondary | ICD-10-CM | POA: Insufficient documentation

## 2019-01-20 DIAGNOSIS — Z1231 Encounter for screening mammogram for malignant neoplasm of breast: Secondary | ICD-10-CM

## 2019-01-20 NOTE — Progress Notes (Signed)
Patient ID: Donna [name], female   DOB: 06/26/1971, 48 y.o.   MRN: [id number]  Chief Complaint  Patient presents with  . Fibroids    [name] is a 48 y.o. female.  G1P1001 Patient's last menstrual period was 01/17/2019 (exact date). She has one day of heavy flow with menses lasting 3 days, no cramps but lower abdominal discomfort, referred due to large fibroid uterus. CT 12/2018 and 2018 reviewed. HPI  Past Medical History:  Diagnosis Date  . DVT (deep venous thrombosis) ([place]) 12/06/2012  . Lung collapse   . Pulmonary embolism, blood-clot, obstetric     Past Surgical History:  Procedure Laterality Date  . RIB RESECTION      Family History  Problem Relation Age of Onset  . COPD Father   . Stroke Father     Social History Social History   Tobacco Use  . Smoking status: Never Smoker  . Smokeless tobacco: Never Used  Substance Use Topics  . Alcohol use: Yes    Comment: socially   . Drug use: No    No Known Allergies  Current Outpatient Medications  Medication Sig Dispense Refill  . atorvastatin (LIPITOR) 20 MG tablet Take 1 tablet (20 mg total) by mouth daily. 30 tablet 3  . ferrous sulfate 325 (65 FE) MG tablet One tablet orally twice daily with a meal 60 tablet 2  . warfarin (COUMADIN) 5 MG tablet Take 2 tablets by mouth daily. 60 tablet 2   No current facility-administered medications for this visit.     Review of Systems Review of Systems  Constitutional: Negative.   Respiratory: Negative.   Gastrointestinal: Positive for abdominal distention and abdominal pain.  Genitourinary: Positive for menstrual problem and vaginal bleeding.    Blood pressure 109/71, pulse 90, weight 276 lb 6.4 oz (125.4 kg), last menstrual period 01/17/2019.  Physical Exam Physical Exam Constitutional:      Appearance: She is obese. She is not ill-appearing.  Abdominal:     General: There is distension.     Palpations: There is mass (above umbilicus firm not  tender c/w fiboid uterus).  Neurological:     Mental Status: She is alert.     Data Reviewed CT results   Assessment Large fibroid uterus with symptoms  Plan Evaluate for TAH. RTC for endometrial biopsy    Emeterio Reeve 01/20/2019, 3:07 PM

## 2019-01-20 NOTE — Progress Notes (Signed)
Mammogram scholarship faxed to scheduled screening mammogram.

## 2019-01-20 NOTE — Patient Instructions (Signed)
Hysterectomy Information  A hysterectomy is a surgery to remove your uterus. After surgery, you will no longer have periods. Also, you will no longer be able to get pregnant. Reasons for this surgery You may have this surgery if:  You have bleeding in your vagina: ? That is not normal. ? That does not stop, or that keeps coming back.  You have long-term (chronic) pain in your lower belly (pelvic area).  The lining of your uterus grows outside of the uterus (endometriosis).  The lining of your uterus grows in the muscle of the uterus (adenomyosis).  Your uterus falls down into your vagina (prolapse).  You have a growth in your uterus that causes problems (uterine fibroids).  You have cells that could turn into cancer (precancerous cells).  You have cancer of the uterus or cervix. Types of hysterectomies There are 3 types of hysterectomies. Depending on the type, the surgery will:  Remove the top part of the uterus (supracervical).  Remove the uterus and the cervix (total).  Remove the uterus, cervix, and tissue that holds the uterus in place (radical). Ways a hysterectomy can be done This surgery may be done in one of these ways:  A cut (incision) is made in the belly (abdomen). The uterus is taken out through the cut.  A cut is made in the vagina. The uterus is taken out through the cut.  Three or four cuts are made in the belly. A device with a camera is put through one of the cuts. The uterus is cut into pieces and taken out through the cuts or the vagina.  Three or four cuts are made in the belly. A device with a camera is put through one of the cuts. The uterus is taken out through the vagina.  Three or four cuts are made in the belly. A computer helps control the surgical tools. The uterus is cut into small pieces. The pieces are taken out through the cuts or through the vagina. Talk with your doctor about which way is best for you. Risks of hysterectomy Generally,  this surgery is safe. However, problems can happen, including:  Bleeding.  Needing donated blood (transfusion).  Blood clots.  Infection.  Damage to other structures or organs.  Allergic reactions.  Needing to switch to a different type of surgery. What to expect after surgery  You will be given pain medicine.  You will need to stay in the hospital for 1-2 days.  Follow your doctor's instructions about: ? Exercising. ? Driving. ? What activities are safe for you.  You will need to have someone with you at home for 3-5 days.  You will need to see your doctor after 2-4 weeks.  You may get hot flashes, have night sweats, and have trouble sleeping.  You may need to have Pap tests if your surgery was related to cancer. Talk with your doctor about how often you need Pap tests. Questions to ask your doctor  Do I need this surgery? Do I have other treatment options?  What are my options for this surgery?  What needs to be removed?  What are the risks?  What are the benefits?  How long will I need to stay in the hospital?  How long will I need to recover?  What symptoms can I expect after the procedure? Summary  A hysterectomy is a surgery to remove your uterus. After surgery, you will no longer have periods. Also, you will no longer be able to   get pregnant.  Talk with your doctor about which type of hysterectomy is best for you. This information is not intended to replace advice given to you by your health care provider. Make sure you discuss any questions you have with your health care provider. Document Released: 09/25/2011 Document Revised: 09/05/2018 Document Reviewed: 10/03/2016 Elsevier Patient Education  2020 [employer].

## 2019-01-29 ENCOUNTER — Other Ambulatory Visit: Payer: Self-pay | Admitting: Family Medicine

## 2019-01-29 MED FILL — ATORVASTATIN 20 MG TABLET: 20 | 30 days supply | Qty: 30 | Fill #3

## 2019-01-30 ENCOUNTER — Encounter

## 2019-01-30 ENCOUNTER — Ambulatory Visit: Payer: Medicaid Other | Admitting: Obstetrics & Gynecology

## 2019-01-30 ENCOUNTER — Encounter: Payer: Self-pay | Admitting: *Deleted

## 2019-01-30 ENCOUNTER — Telehealth: Payer: Self-pay | Admitting: Obstetrics & Gynecology

## 2019-01-30 ENCOUNTER — Encounter: Payer: Self-pay | Admitting: Obstetrics & Gynecology

## 2019-01-30 ENCOUNTER — Other Ambulatory Visit: Payer: Self-pay

## 2019-01-30 MED FILL — WARFARIN SODIUM 5 MG TABLET: 5 | 30 days supply | Qty: 60 | Fill #0

## 2019-01-30 NOTE — Telephone Encounter (Signed)
The patient attended the appointment however she could not provide a urine. After waiting about 2 hours the appointment was rescheduled. The patient has already paid the copay for the visit. Please apply toward the next visit.

## 2019-02-11 ENCOUNTER — Encounter: Payer: Medicaid Other | Admitting: Pharmacist

## 2019-02-17 ENCOUNTER — Other Ambulatory Visit: Payer: Self-pay

## 2019-02-17 ENCOUNTER — Encounter (HOSPITAL_BASED_OUTPATIENT_CLINIC_OR_DEPARTMENT_OTHER): Payer: Self-pay | Admitting: Emergency Medicine

## 2019-02-17 ENCOUNTER — Emergency Department (HOSPITAL_BASED_OUTPATIENT_CLINIC_OR_DEPARTMENT_OTHER)
Admission: EM | Admit: 2019-02-17 | Discharge: 2019-02-17 | Disposition: A | Discharge status: Home / Self Care | Payer: Medicaid Other | Attending: Emergency Medicine | Admitting: Emergency Medicine

## 2019-02-17 DIAGNOSIS — S61012A Laceration without foreign body of left thumb without damage to nail, initial encounter: Secondary | ICD-10-CM

## 2019-02-17 DIAGNOSIS — Y9289 Other specified places as the place of occurrence of the external cause: Secondary | ICD-10-CM | POA: Insufficient documentation

## 2019-02-17 DIAGNOSIS — W25XXXA Contact with sharp glass, initial encounter: Secondary | ICD-10-CM | POA: Insufficient documentation

## 2019-02-17 DIAGNOSIS — Y9389 Activity, other specified: Secondary | ICD-10-CM | POA: Insufficient documentation

## 2019-02-17 DIAGNOSIS — E785 Hyperlipidemia, unspecified: Secondary | ICD-10-CM | POA: Insufficient documentation

## 2019-02-17 DIAGNOSIS — Y998 Other external cause status: Secondary | ICD-10-CM | POA: Insufficient documentation

## 2019-02-17 DIAGNOSIS — Z7901 Long term (current) use of anticoagulants: Secondary | ICD-10-CM | POA: Insufficient documentation

## 2019-02-17 MED ORDER — LIDOCAINE-EPINEPHRINE (PF) 2 %-1:200000 IJ SOLN
20.0000 mL | Freq: Once | INTRAMUSCULAR | Status: DC
  Filled 2019-02-17: qty 20

## 2019-02-17 MED ORDER — BACITRACIN ZINC 500 UNIT/GM EX OINT
TOPICAL_OINTMENT | Freq: Once | CUTANEOUS | Status: AC
  Administered 2019-02-17: 1 via TOPICAL
  Filled 2019-02-17: qty 28.35

## 2019-02-17 MED ORDER — LIDOCAINE-EPINEPHRINE (PF) 2 %-1:200000 IJ SOLN
10.0000 mL | Freq: Once | INTRAMUSCULAR | Status: AC
  Administered 2019-02-17: 10 mL via INTRADERMAL
  Filled 2019-02-17: qty 10

## 2019-02-17 MED ORDER — LIDOCAINE-EPINEPHRINE (PF) 2 %-1:200000 IJ SOLN
INTRAMUSCULAR | Status: AC
  Administered 2019-02-17: 10 mL via INTRADERMAL
  Filled 2019-02-17: qty 10

## 2019-02-17 NOTE — ED Provider Notes (Signed)
[facility] EMERGENCY DEPARTMENT Provider Note   CSN: [id number] Arrival date & time: 02/17/19  8315     History   Chief Complaint Chief Complaint  Patient presents with  . Laceration    [name] is a 48 y.o. female.     Patient indicates accidental laceration to left thumb from broken glass while doing dishes this AM. Symptoms acute onset, moderate, persistent. No numbness to thumb. Tetanus within past 1-2 years. Denies other pain or injury. Minor bleeding has stopped with pressure. States glass broken in few large pieces - and feels no fb in wound.   The history is provided by the patient.  Laceration Associated symptoms: no fever     Past Medical History:  Diagnosis Date  . DVT (deep venous thrombosis) ([place]) 12/06/2012  . Lung collapse   . Pulmonary embolism, blood-clot, obstetric     Patient Active Problem List   Diagnosis Date Noted  . Fibroid uterus 01/20/2019  . Hyperlipidemia 10/17/2017  . Hypercoagulable state ([place]) 09/11/2017  . Personal history of venous thrombosis and embolism 08/01/2015  . Venous thrombosis 08/12/2014  . 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 Iron deficiency anemia 07/16/2014  . Pleuritic chest pain 07/16/2014  . High risk medication use 02/12/2013  . Chronic anticoagulation 02/12/2013  . Healthcare maintenance 02/12/2013  . Obesity 12/30/2012  . Menorrhagia 12/16/2012  . Pulmonary embolism ([place]) 12/05/2012  . Anemia 12/05/2012    Past Surgical History:  Procedure Laterality Date  . RIB RESECTION       OB History    Gravida  1   Para  1   Term  1   Preterm  0   AB  0   Living  1     SAB  0   TAB  0   Ectopic  0   Multiple  0   Live Births               Home Medications    Prior to Admission medications   Medication Sig Start Date End Date Taking? Authorizing Provider  atorvastatin (LIPITOR) 20 MG tablet Take 1 tablet (20 mg total) by mouth daily. 04/18/18   Charlott Rakes, MD  ferrous sulfate 325 (65 FE) MG tablet One tablet orally twice daily with a meal 01/12/16   Truitt Merle, MD  warfarin (COUMADIN) 5 MG tablet TAKE 2 TABLETS BY MOUTH DAILY. 01/30/19   Charlott Rakes, MD    Family History Family History  Problem Relation Age of Onset  . COPD Father   . Stroke Father     Social History Social History   Tobacco Use  . Smoking status: Never Smoker  . Smokeless tobacco: Never Used  Substance Use Topics  . Alcohol use: Yes    Comment: socially   . Drug use: No     Allergies   Patient has no known allergies.   Review of Systems Review of Systems  Constitutional: Negative for fever.  Gastrointestinal: Negative for nausea and vomiting.  Skin: Positive for wound.  Neurological: Negative for numbness.     Physical Exam Updated Vital Signs BP 115/79   Pulse 83   Temp 98.2 F (36.8 C) (Oral)   Resp 18   Ht 1.753 m (5\' 9" )   Wt 122.5 kg   LMP 02/13/2019   SpO2 100%   BMI 39.87 kg/m   Physical Exam Vitals signs and nursing note reviewed.  Constitutional:  Appearance: Normal appearance. She is well-developed.  HENT:     Head: Atraumatic.     Nose: Nose normal.     Mouth/Throat:     Mouth: Mucous membranes are moist.  Eyes:     General: No scleral icterus.    Conjunctiva/sclera: Conjunctivae normal.  Neck:     Musculoskeletal: Normal range of motion. No neck rigidity.     Trachea: No tracheal deviation.  Cardiovascular:     Rate and Rhythm: Normal rate.     Pulses: Normal pulses.  Pulmonary:     Effort: Pulmonary effort is normal. No respiratory distress.  Genitourinary:    Comments: No cva tenderness.  Musculoskeletal:        General: No swelling.     Comments: 3 cm laceration to thumb. Normal cap refill distally. Flexor/extensor tendon fxn intact. Normal rom.   Skin:    General: Skin is warm and dry.     Findings: No rash.  Neurological:     Mental Status: She is alert.     Comments: Alert, speech normal.  Motor/sens grossly intact to thumb.   Psychiatric:        Mood and Affect: Mood normal.      ED Treatments / Results  Labs (all labs ordered are listed, but only abnormal results are displayed) Labs Reviewed - No data to display  EKG None  Radiology No results found.  Procedures .Marland KitchenLaceration Repair  Date/Time: 02/17/2019 12:39 PM Performed by: Lajean Saver, MD Authorized by: Lajean Saver, MD   Consent:    Consent obtained:  Verbal Anesthesia (see MAR for exact dosages):    Anesthesia method:  Nerve block   Block location:  Digit block left thumb   Block anesthetic:  Lidocaine 2% w/o epi   Block technique:  Digit block Laceration details:    Location:  Finger   Finger location:  L thumb   Length (cm):  3 Repair type:    Repair type:  Simple Pre-procedure details:    Preparation:  Patient was prepped and draped in usual sterile fashion Exploration:    Contaminated: no   Treatment:    Area cleansed with:  Betadine   Amount of cleaning:  Standard   Irrigation solution:  Sterile saline   Irrigation method:  Syringe   Visualized foreign bodies/material removed: no   Skin repair:    Repair method:  Sutures   Suture size:  5-0   Suture material:  Prolene   Suture technique:  Simple interrupted   Number of sutures:  6 Post-procedure details:    Patient tolerance of procedure:  Tolerated well, no immediate complications   (including critical care time)  Medications Ordered in ED Medications - No data to display   Initial Impression / Assessment and Plan / ED Course  I have reviewed the triage vital signs and the nursing notes.  Pertinent labs & imaging results that were available during my care of the patient were reviewed by me and considered in my medical decision making (see chart for details).  Laceration repair.   Sterile dressing.   Reviewed nursing notes and prior charts for additional history.     Final Clinical Impressions(s) / ED Diagnoses    Final diagnoses:  None    ED Discharge Orders    None       Lajean Saver, MD 02/17/19 1240

## 2019-02-17 NOTE — ED Triage Notes (Signed)
Pt reports LT thumb laceration at about midnight from broken glass

## 2019-02-17 NOTE — Discharge Instructions (Signed)
It was our pleasure to provide your ER care today - we hope that you feel better.  Keep area very clean.   Have sutures removed, your doctor or urgent care, in 9-10 days.   Return to ER if worse, new symptoms, infection of wound, pus, spreading redness, other concern.

## 2019-02-28 DIAGNOSIS — Z79899 Other long term (current) drug therapy: Secondary | ICD-10-CM | POA: Insufficient documentation

## 2019-02-28 DIAGNOSIS — Z7901 Long term (current) use of anticoagulants: Secondary | ICD-10-CM | POA: Insufficient documentation

## 2019-02-28 DIAGNOSIS — S61012D Laceration without foreign body of left thumb without damage to nail, subsequent encounter: Secondary | ICD-10-CM | POA: Insufficient documentation

## 2019-02-28 DIAGNOSIS — X58XXXD Exposure to other specified factors, subsequent encounter: Secondary | ICD-10-CM | POA: Insufficient documentation

## 2019-03-01 ENCOUNTER — Emergency Department (HOSPITAL_BASED_OUTPATIENT_CLINIC_OR_DEPARTMENT_OTHER)
Admission: EM | Admit: 2019-03-01 | Discharge: 2019-03-01 | Disposition: A | Discharge status: Home / Self Care | Payer: Medicaid Other | Attending: Emergency Medicine | Admitting: Emergency Medicine

## 2019-03-01 ENCOUNTER — Encounter (HOSPITAL_BASED_OUTPATIENT_CLINIC_OR_DEPARTMENT_OTHER): Payer: Self-pay | Admitting: Emergency Medicine

## 2019-03-01 ENCOUNTER — Other Ambulatory Visit: Payer: Self-pay

## 2019-03-01 DIAGNOSIS — Z4802 Encounter for removal of sutures: Secondary | ICD-10-CM

## 2019-03-01 HISTORY — DX: Thromboembolism in pregnancy, unspecified trimester: O88.219

## 2019-03-01 NOTE — ED Provider Notes (Signed)
   [facility] Provider Note: Georgena Spurling, MD, FACEP  CSN: [id number] MRN: [id number] ARRIVAL: 02/28/19 at [place]: [place] / Staple Removal   HISTORY OF PRESENT ILLNESS  03/01/19 12:06 AM Donna [name] is a 48 y.o. female who cut the pad of her left thumb and had 5 sutures placed on February 17, 2019.  She is here for suture removal.  The wound is healing well without pain or redness.  The sutures were removed by nursing staff prior to my evaluation.   Past Medical History:  Diagnosis Date  . DVT (deep venous thrombosis) ([place]) 12/06/2012  . Lung collapse   . Pulmonary embolism affecting pregnancy     Past Surgical History:  Procedure Laterality Date  . RIB RESECTION      Family History  Problem Relation Age of Onset  . COPD Father   . Stroke Father     Social History   Tobacco Use  . Smoking status: Never Smoker  . Smokeless tobacco: Never Used  Substance Use Topics  . Alcohol use: Yes    Comment: socially   . Drug use: No    Prior to Admission medications   Medication Sig Start Date End Date Taking? Authorizing Provider  atorvastatin (LIPITOR) 20 MG tablet Take 1 tablet (20 mg total) by mouth daily. 04/18/18   Charlott Rakes, MD  ferrous sulfate 325 (65 FE) MG tablet One tablet orally twice daily with a meal 01/12/16   Truitt Merle, MD  warfarin (COUMADIN) 5 MG tablet TAKE 2 TABLETS BY MOUTH DAILY. 01/30/19   Charlott Rakes, MD    Allergies Patient has no known allergies.   REVIEW OF SYSTEMS  Negative except as noted here or in the History of Present Illness.   PHYSICAL EXAMINATION  Initial Vital Signs Blood pressure 120/79, pulse 93, temperature 98.2 F (36.8 C), height 5\' 9"  (1.753 m), weight 122 kg, last menstrual period 02/13/2019, SpO2 100 %.  Examination General: Well-developed, well-nourished female in no acute distress; appearance consistent with age of record HENT: normocephalic; atraumatic Eyes:  Normal appearance Neck: supple Heart: regular rate and rhythm Lungs: clear to auscultation bilaterally Abdomen: soft; nondistended; nontender; bowel sounds present Extremities: No deformity; full range of motion Neurologic: Awake, alert and oriented; motor function intact in all extremities and symmetric; no facial droop Skin: Warm and dry; well-healing laceration of left thumb pad without signs of infection Psychiatric: Normal mood and affect   RESULTS  Summary of this visit's results, reviewed by myself:   EKG Interpretation  Date/Time:    Ventricular Rate:    PR Interval:    QRS Duration:   QT Interval:    QTC Calculation:   R Axis:     Text Interpretation:        Laboratory Studies: No results found for this or any previous visit (from the past 24 hour(s)). Imaging Studies: No results found.  ED COURSE and MDM  Nursing notes and initial vitals signs, including pulse oximetry, reviewed.  Vitals:   03/01/19 0002 03/01/19 0005  BP:  120/79  Pulse:  93  Temp:  98.2 F (36.8 C)  SpO2:  100%  Weight: 122 kg   Height: 5\' 9"  (1.753 m)     PROCEDURES    ED DIAGNOSES     ICD-10-CM   1. Encounter for removal of sutures  Z48.02        Woodruff Skirvin, Jenny Reichmann, MD 03/01/19 [phone]

## 2019-03-01 NOTE — ED Notes (Signed)
ED Provider at bedside. 

## 2019-03-01 NOTE — ED Triage Notes (Signed)
Here for suture removal

## 2019-03-01 NOTE — ED Notes (Signed)
5 sutures removed w/o difficulty

## 2019-03-04 ENCOUNTER — Other Ambulatory Visit: Payer: Self-pay

## 2019-03-04 ENCOUNTER — Ambulatory Visit: Payer: Self-pay | Attending: Family Medicine | Admitting: Pharmacist

## 2019-03-04 ENCOUNTER — Other Ambulatory Visit: Payer: Self-pay | Admitting: Family Medicine

## 2019-03-04 DIAGNOSIS — Z7901 Long term (current) use of anticoagulants: Secondary | ICD-10-CM

## 2019-03-04 LAB — POCT INR: INR: 2.4 (ref 2.0–3.0)

## 2019-03-04 MED FILL — WARFARIN SODIUM 5 MG TABLET: 5 | 30 days supply | Qty: 60 | Fill #1

## 2019-03-12 ENCOUNTER — Encounter

## 2019-03-13 ENCOUNTER — Other Ambulatory Visit: Payer: Self-pay

## 2019-03-13 ENCOUNTER — Encounter: Payer: Self-pay | Admitting: Obstetrics & Gynecology

## 2019-03-13 ENCOUNTER — Ambulatory Visit (INDEPENDENT_AMBULATORY_CARE_PROVIDER_SITE_OTHER): Payer: Self-pay | Admitting: Obstetrics & Gynecology

## 2019-03-13 ENCOUNTER — Other Ambulatory Visit (HOSPITAL_COMMUNITY)
Admission: RE | Admit: 2019-03-13 | Discharge: 2019-03-13 | Disposition: A | Discharge status: Home / Self Care | Payer: Medicaid Other | Source: Ambulatory Visit | Attending: Obstetrics & Gynecology | Admitting: Obstetrics & Gynecology

## 2019-03-13 VITALS — BP 119/79 | HR 88 | Temp 98.1°F | Ht 69.0 in | Wt 287.1 lb

## 2019-03-13 DIAGNOSIS — N840 Polyp of corpus uteri: Secondary | ICD-10-CM

## 2019-03-13 DIAGNOSIS — D259 Leiomyoma of uterus, unspecified: Secondary | ICD-10-CM

## 2019-03-13 DIAGNOSIS — N920 Excessive and frequent menstruation with regular cycle: Secondary | ICD-10-CM

## 2019-03-13 LAB — POCT PREGNANCY, URINE: Preg Test, Ur: NEGATIVE

## 2019-03-13 NOTE — Progress Notes (Signed)
Cc: abnormal uterine bleeding and fibroids for scheduled biopsy  48 y.o. G1P1001 Patient's last menstrual period was 02/13/2019.  She returns for endometrial biopsy  Patient given informed consent, signed copy in the chart, time out was performed. Appropriate time out taken. . The patient was placed in the lithotomy position and the cervix brought into view with sterile speculum.  Portio of cervix cleansed x 2 with betadine swabs.  A tenaculum was placed in the anterior lip of the cervix.  The uterus was sounded for depth of 9 cm. A pipelle was introduced to into the uterus, suction created,  and an endometrial sample was obtained. All equipment was removed and accounted for.  The patient tolerated the procedure well.    Patient given post procedure instructions. The patient will return in 2 weeks for results or will be contacted via [facility]. Will schedule for TAH BS after bx result  [name], MD 03/13/2019

## 2019-03-13 NOTE — Patient Instructions (Signed)

## 2019-03-18 ENCOUNTER — Other Ambulatory Visit: Payer: Self-pay

## 2019-03-18 ENCOUNTER — Ambulatory Visit: Payer: Self-pay | Attending: Family Medicine | Admitting: Family Medicine

## 2019-03-18 ENCOUNTER — Encounter: Payer: Self-pay | Admitting: Family Medicine

## 2019-03-18 VITALS — BP 122/78 | HR 89 | Temp 98.2°F | Ht 69.0 in | Wt 289.2 lb

## 2019-03-18 DIAGNOSIS — E66813 Obesity, class 3: Secondary | ICD-10-CM

## 2019-03-18 DIAGNOSIS — E7800 Pure hypercholesterolemia, unspecified: Secondary | ICD-10-CM

## 2019-03-18 DIAGNOSIS — Z86718 Personal history of other venous thrombosis and embolism: Secondary | ICD-10-CM

## 2019-03-18 DIAGNOSIS — D259 Leiomyoma of uterus, unspecified: Secondary | ICD-10-CM

## 2019-03-18 DIAGNOSIS — Z6841 Body Mass Index (BMI) 40.0 and over, adult: Secondary | ICD-10-CM

## 2019-03-18 MED ORDER — ATORVASTATIN CALCIUM 20 MG PO TABS: 20.0000 mg | ORAL_TABLET | Freq: Every day | ORAL | 6 refills | Status: DC

## 2019-03-18 NOTE — Progress Notes (Signed)
Patient is wanting to get her Cholesterol checked today.  Patient is fasting.

## 2019-03-18 NOTE — Patient Instructions (Signed)
° °Calorie Counting for Weight Loss °Calories are units of energy. Your body needs a certain amount of calories from food to keep you going throughout the day. When you eat more calories than your body needs, your body stores the extra calories as fat. When you eat fewer calories than your body needs, your body burns fat to get the energy it needs. °Calorie counting means keeping track of how many calories you eat and drink each day. Calorie counting can be helpful if you need to lose weight. If you make sure to eat fewer calories than your body needs, you should lose weight. Ask your health care provider what a healthy weight is for you. °For calorie counting to work, you will need to eat the right number of calories in a day in order to lose a healthy amount of weight per week. A dietitian can help you determine how many calories you need in a day and will give you suggestions on how to reach your calorie goal. °· A healthy amount of weight to lose per week is usually 1-2 lb (0.5-0.9 kg). This usually means that your daily calorie intake should be reduced by 500-750 calories. °· Eating 1,200 - 1,500 calories per day can help most women lose weight. °· Eating 1,500 - 1,800 calories per day can help most men lose weight. °What is my plan? °My goal is to have __________ calories per day. °If I have this many calories per day, I should lose around __________ pounds per week. °What do I need to know about calorie counting? °In order to meet your daily calorie goal, you will need to: °· Find out how many calories are in each food you would like to eat. Try to do this before you eat. °· Decide how much of the food you plan to eat. °· Write down what you ate and how many calories it had. Doing this is called keeping a food log. °To successfully lose weight, it is important to balance calorie counting with a healthy lifestyle that includes regular activity. Aim for 150 minutes of moderate exercise (such as walking) or 75  minutes of vigorous exercise (such as running) each week. °Where do I find calorie information? ° °The number of calories in a food can be found on a Nutrition Facts label. If a food does not have a Nutrition Facts label, try to look up the calories online or ask your dietitian for help. °Remember that calories are listed per serving. If you choose to have more than one serving of a food, you will have to multiply the calories per serving by the amount of servings you plan to eat. For example, the label on a package of bread might say that a serving size is 1 slice and that there are 90 calories in a serving. If you eat 1 slice, you will have eaten 90 calories. If you eat 2 slices, you will have eaten 180 calories. °How do I keep a food log? °Immediately after each meal, record the following information in your food log: °· What you ate. Don't forget to include toppings, sauces, and other extras on the food. °· How much you ate. This can be measured in cups, ounces, or number of items. °· How many calories each food and drink had. °· The total number of calories in the meal. °Keep your food log near you, such as in a small notebook in your pocket, or use a mobile app or website. Some programs will   calculate calories for you and show you how many calories you have left for the day to meet your goal. °What are some calorie counting tips? ° °· Use your calories on foods and drinks that will fill you up and not leave you hungry: °? Some examples of foods that fill you up are nuts and nut butters, vegetables, lean proteins, and high-fiber foods like whole grains. High-fiber foods are foods with more than 5 g fiber per serving. °? Drinks such as sodas, specialty coffee drinks, alcohol, and juices have a lot of calories, yet do not fill you up. °· Eat nutritious foods and avoid empty calories. Empty calories are calories you get from foods or beverages that do not have many vitamins or protein, such as candy, sweets, and  soda. It is better to have a nutritious high-calorie food (such as an avocado) than a food with few nutrients (such as a bag of chips). °· Know how many calories are in the foods you eat most often. This will help you calculate calorie counts faster. °· Pay attention to calories in drinks. Low-calorie drinks include water and unsweetened drinks. °· Pay attention to nutrition labels for "low fat" or "fat free" foods. These foods sometimes have the same amount of calories or more calories than the full fat versions. They also often have added sugar, starch, or salt, to make up for flavor that was removed with the fat. °· Find a way of tracking calories that works for you. Get creative. Try different apps or programs if writing down calories does not work for you. °What are some portion control tips? °· Know how many calories are in a serving. This will help you know how many servings of a certain food you can have. °· Use a measuring cup to measure serving sizes. You could also try weighing out portions on a kitchen scale. With time, you will be able to estimate serving sizes for some foods. °· Take some time to put servings of different foods on your favorite plates, bowls, and cups so you know what a serving looks like. °· Try not to eat straight from a bag or box. Doing this can lead to overeating. Put the amount you would like to eat in a cup or on a plate to make sure you are eating the right portion. °· Use smaller plates, glasses, and bowls to prevent overeating. °· Try not to multitask (for example, watch TV or use your computer) while eating. If it is time to eat, sit down at a table and enjoy your food. This will help you to know when you are full. It will also help you to be aware of what you are eating and how much you are eating. °What are tips for following this plan? °Reading food labels °· Check the calorie count compared to the serving size. The serving size may be smaller than what you are used to  eating. °· Check the source of the calories. Make sure the food you are eating is high in vitamins and protein and low in saturated and trans fats. °Shopping °· Read nutrition labels while you shop. This will help you make healthy decisions before you decide to purchase your food. °· Make a grocery list and stick to it. °Cooking °· Try to cook your favorite foods in a healthier way. For example, try baking instead of frying. °· Use low-fat dairy products. °Meal planning °· Use more fruits and vegetables. Half of your plate should be   fruits and vegetables. °· Include lean proteins like poultry and fish. °How do I count calories when eating out? °· Ask for smaller portion sizes. °· Consider sharing an entree and sides instead of getting your own entree. °· If you get your own entree, eat only half. Ask for a box at the beginning of your meal and put the rest of your entree in it so you are not tempted to eat it. °· If calories are listed on the menu, choose the lower calorie options. °· Choose dishes that include vegetables, fruits, whole grains, low-fat dairy products, and lean protein. °· Choose items that are boiled, broiled, grilled, or steamed. Stay away from items that are buttered, battered, fried, or served with cream sauce. Items labeled "crispy" are usually fried, unless stated otherwise. °· Choose water, low-fat milk, unsweetened iced tea, or other drinks without added sugar. If you want an alcoholic beverage, choose a lower calorie option such as a glass of wine or light beer. °· Ask for dressings, sauces, and syrups on the side. These are usually high in calories, so you should limit the amount you eat. °· If you want a salad, choose a garden salad and ask for grilled meats. Avoid extra toppings like bacon, cheese, or fried items. Ask for the dressing on the side, or ask for olive oil and vinegar or lemon to use as dressing. °· Estimate how many servings of a food you are given. For example, a serving of  cooked rice is ½ cup or about the size of half a baseball. Knowing serving sizes will help you be aware of how much food you are eating at restaurants. The list below tells you how big or small some common portion sizes are based on everyday objects: °? 1 oz--4 stacked dice. °? 3 oz--1 deck of cards. °? 1 tsp--1 die. °? 1 Tbsp--½ a ping-pong ball. °? 2 Tbsp--1 ping-pong ball. °? ½ cup--½ baseball. °? 1 cup--1 baseball. °Summary °· Calorie counting means keeping track of how many calories you eat and drink each day. If you eat fewer calories than your body needs, you should lose weight. °· A healthy amount of weight to lose per week is usually 1-2 lb (0.5-0.9 kg). This usually means reducing your daily calorie intake by 500-750 calories. °· The number of calories in a food can be found on a Nutrition Facts label. If a food does not have a Nutrition Facts label, try to look up the calories online or ask your dietitian for help. °· Use your calories on foods and drinks that will fill you up, and not on foods and drinks that will leave you hungry. °· Use smaller plates, glasses, and bowls to prevent overeating. °This information is not intended to replace advice given to you by your health care provider. Make sure you discuss any questions you have with your health care provider. °Document Released: 07/03/2005 Document Revised: 03/22/2018 Document Reviewed: 06/02/2016 °Elsevier Patient Education © 2020 [employer] ° °

## 2019-03-18 NOTE — Progress Notes (Signed)
Subjective:  Patient ID: Donna [name], female    DOB: 1971-04-10  Age: 48 y.o. 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 She ran out of Lipitor a couple of days ago but prior to that has been compliant.  Currently fasting in anticipation of blood work today. Her Coumadin is managed by the clinical pharmacist here in the clinic. She recently got diagnosed with uterine fibroids and is being worked up for elective procedure. Denies chest pains, pedal edema and has no additional concerns today.  Past Medical History:  Diagnosis Date  . DVT (deep venous thrombosis) ([place]) 12/06/2012  . Lung collapse   . Pulmonary embolism affecting pregnancy     Past Surgical History:  Procedure Laterality Date  . RIB RESECTION      Family History  Problem Relation Age of Onset  . COPD Father   . Stroke Father     No Known Allergies  Outpatient Medications Prior to Visit  Medication Sig Dispense Refill  . ferrous sulfate 325 (65 FE) MG tablet One tablet orally twice daily with a meal 60 tablet 2  . warfarin (COUMADIN) 5 MG tablet TAKE 2 TABLETS BY MOUTH DAILY. 60 tablet 2  . atorvastatin (LIPITOR) 20 MG tablet Take 1 tablet (20 mg total) by mouth daily. 30 tablet 3   No facility-administered medications prior to visit.      ROS Review of Systems  Constitutional: Negative for activity change, appetite change and fatigue.  HENT: Negative for congestion, sinus pressure and sore throat.   Eyes: Negative for visual disturbance.  Respiratory: Negative for cough, chest tightness, shortness of breath and wheezing.   Cardiovascular: Negative for chest pain and  palpitations.  Gastrointestinal: Negative for abdominal distention, abdominal pain and constipation.  Endocrine: Negative for polydipsia.  Genitourinary: Negative for dysuria and frequency.  Musculoskeletal: Negative for arthralgias and back pain.  Skin: Negative for rash.  Neurological: Negative for tremors, light-headedness and numbness.  Hematological: Does not bruise/bleed easily.  Psychiatric/Behavioral: Negative for agitation and behavioral problems.    Objective:  BP 122/78   Pulse 89   Temp 98.2 F (36.8 C) (Oral)   Ht _0  (1.753 m)   Wt 289 lb 3.2 oz (131.2 kg)   SpO2 99%   BMI 42.71 kg/m   BP/Weight 03/18/2019 03/13/2019 10/23/8117  Systolic BP 147 829 562  Diastolic BP 78 79 79  Wt. (Lbs) 289.2 287.1 268.96  BMI 42.71 42.4 39.72      Physical Exam Constitutional:      Appearance: She is well-developed. She is obese.  Cardiovascular:     Rate and Rhythm: Normal rate.     Heart sounds: Normal heart sounds. No murmur.  Pulmonary:     Effort: Pulmonary effort is normal.     Breath sounds: Normal breath sounds. No wheezing or rales.  Chest:     Chest wall: No tenderness.  Abdominal:     General: Bowel sounds are normal. There is no distension.     Palpations: Abdomen is soft. There is no mass.     Tenderness: There is no abdominal tenderness.  Musculoskeletal: Normal range of motion.  Neurological:     Mental Status: She is alert and  oriented to person, place, and time.     CMP Latest Ref Rng & Units 12/23/2018 06/17/2018 04/17/2018  Glucose 70 - 99 mg/dL 114(H) 103(H) 103(H)  BUN 6 - 20 mg/dL _0 Creatinine 0.44 - 1.00 mg/dL 0.96 0.89 0.95  Sodium 135 - 145 mmol/L 134(L) 139 139  Potassium 3.5 - 5.1 mmol/L 3.8 3.8 4.3  Chloride 98 - 111 mmol/L 103 107 103  CO2 22 - 32 mmol/L _1 Calcium 8.9 - 10.3 mg/dL 8.4(L) 8.3(L) 8.8  Total Protein 6.5 - 8.1 g/dL 7.4 6.6 6.3  Total Bilirubin 0.3 - 1.2 mg/dL 0.5 0.3 <0.2  Alkaline Phos 38 - 126 U/L 69 71  58  AST 15 - 41 U/L 14(L) 12(L) 11  ALT 0 - 44 U/L _2 Lipid Panel     Component Value Date/Time   CHOL 248 (H) 04/17/2018 1517   TRIG 158 (H) 04/17/2018 1517   HDL 39 (L) 04/17/2018 1517   CHOLHDL 6.4 (H) 04/17/2018 1517   CHOLHDL 5.3 05/16/2013 0925   VLDL 36 05/16/2013 0925   LDLCALC 177 (H) 04/17/2018 1517    CBC    Component Value Date/Time   WBC 11.0 (H) 12/23/2018 2214   RBC 4.22 12/23/2018 2214   HGB 11.7 (L) 12/23/2018 2214   HGB 12.3 07/11/2017 1209   HCT 36.8 12/23/2018 2214   HCT 38.5 07/11/2017 1209   PLT 366 12/23/2018 2214   PLT 249 07/11/2017 1209   MCV 87.2 12/23/2018 2214   MCV 84.2 07/11/2017 1209   MCH 27.7 12/23/2018 2214   MCHC 31.8 12/23/2018 2214   RDW 18.5 (H) 12/23/2018 2214   RDW 16.7 (H) 07/11/2017 1209   LYMPHSABS 2.1 12/23/2018 2214   LYMPHSABS 2.0 07/11/2017 1209   MONOABS 1.2 (H) 12/23/2018 2214   MONOABS 0.4 07/11/2017 1209   EOSABS 0.1 12/23/2018 2214   EOSABS 0.1 07/11/2017 1209   BASOSABS 0.0 12/23/2018 2214   BASOSABS 0.0 07/11/2017 1209      Assessment & Plan:   1. Pure hypercholesterolemia Uncontrolled Compliant with Lipitor emphasized Low-cholesterol diet, weight loss will be beneficial - CMP14+EGFR - Lipid panel - atorvastatin (LIPITOR) 20 MG tablet; Take 1 tablet (20 mg total) by mouth daily.  Dispense: 30 tablet; Refill: 6  2. Personal history of venous thrombosis and embolism On anticoagulation with Coumadin which is managed by the clinical pharmacist  3. Uterine leiomyoma, unspecified location Management as per GYN  4. Class 3 severe obesity due to excess calories without serious comorbidity with body mass index (BMI) of 40.0 to 44.9 in adult [facility]) Reduce portion sizes, increase physical activity   Health Care Maintenance: Declines flu shot today Meds ordered this encounter  Medications  . atorvastatin (LIPITOR) 20 MG tablet    Sig: Take 1 tablet (20 mg total) by mouth daily.    Dispense:  30  tablet    Refill:  6    Follow-up: Return in about 6 months (around 09/15/2019) for medical conditions.       Charlott Rakes, MD, FAAFP. [facility], [place]   03/18/2019, 4:05 PM

## 2019-03-19 LAB — CMP14+EGFR
ALT: 10 IU/L (ref 0–32)
AST: 11 IU/L (ref 0–40)
Albumin/Globulin Ratio: 1.2 (ref 1.2–2.2)
Albumin: 3.3 g/dL — ABNORMAL LOW (ref 3.8–4.8)
Alkaline Phosphatase: 64 IU/L (ref 39–117)
BUN/Creatinine Ratio: 15 (ref 9–23)
BUN: 11 mg/dL (ref 6–24)
Bilirubin Total: 0.2 mg/dL (ref 0.0–1.2)
CO2: 21 mmol/L (ref 20–29)
Calcium: 8 mg/dL — ABNORMAL LOW (ref 8.7–10.2)
Chloride: 106 mmol/L (ref 96–106)
Creatinine, Ser: 0.74 mg/dL (ref 0.57–1.00)
GFR calc Af Amer: 112 mL/min/{1.73_m2} (ref >59)
GFR calc non Af Amer: 97 mL/min/{1.73_m2} (ref >59)
Globulin, Total: 2.8 g/dL (ref 1.5–4.5)
Glucose: 93 mg/dL (ref 65–99)
Potassium: 4 mmol/L (ref 3.5–5.2)
Sodium: 138 mmol/L (ref 134–144)
Total Protein: 6.1 g/dL (ref 6.0–8.5)

## 2019-03-19 LAB — LIPID PANEL
Chol/HDL Ratio: 4.6 ratio — ABNORMAL HIGH (ref 0.0–4.4)
Cholesterol, Total: 216 mg/dL — ABNORMAL HIGH (ref 100–199)
HDL: 47 mg/dL (ref >39)
LDL Chol Calc (NIH): 135 mg/dL — ABNORMAL HIGH (ref 0–99)
Triglycerides: 193 mg/dL — ABNORMAL HIGH (ref 0–149)
VLDL Cholesterol Cal: 34 mg/dL (ref 5–40)

## 2019-03-19 MED FILL — ATORVASTATIN 20 MG TABLET: 20 | 30 days supply | Qty: 30 | Fill #0

## 2019-04-04 ENCOUNTER — Encounter: Payer: Self-pay | Admitting: Pharmacy Technician

## 2019-04-07 ENCOUNTER — Telehealth: Payer: Self-pay | Admitting: Family Medicine

## 2019-04-07 ENCOUNTER — Inpatient Hospital Stay: Payer: Self-pay | Attending: Hematology

## 2019-04-07 ENCOUNTER — Other Ambulatory Visit: Payer: Self-pay

## 2019-04-07 DIAGNOSIS — D509 Iron deficiency anemia, unspecified: Secondary | ICD-10-CM | POA: Insufficient documentation

## 2019-04-07 LAB — COMPREHENSIVE METABOLIC PANEL
ALT: 9 U/L (ref 0–44)
AST: 10 U/L — ABNORMAL LOW (ref 15–41)
Albumin: 2.8 g/dL — ABNORMAL LOW (ref 3.5–5.0)
Alkaline Phosphatase: 70 U/L (ref 38–126)
Anion gap: 5 (ref 5–15)
BUN: 11 mg/dL (ref 6–20)
CO2: 23 mmol/L (ref 22–32)
Calcium: 8 mg/dL — ABNORMAL LOW (ref 8.9–10.3)
Chloride: 107 mmol/L (ref 98–111)
Creatinine, Ser: 0.85 mg/dL (ref 0.44–1.00)
GFR calc Af Amer: >60 mL/min (ref >60)
GFR calc non Af Amer: >60 mL/min (ref >60)
Glucose, Bld: 118 mg/dL — ABNORMAL HIGH (ref 70–99)
Potassium: 3.7 mmol/L (ref 3.5–5.1)
Sodium: 135 mmol/L (ref 135–145)
Total Bilirubin: 0.3 mg/dL (ref 0.3–1.2)
Total Protein: 6.5 g/dL (ref 6.5–8.1)

## 2019-04-07 LAB — CBC WITH DIFFERENTIAL/PLATELET
Abs Immature Granulocytes: 0.02 10*3/uL (ref 0.00–0.07)
Basophils Absolute: 0 10*3/uL (ref 0.0–0.1)
Basophils Relative: 1 %
Eosinophils Absolute: 0.1 10*3/uL (ref 0.0–0.5)
Eosinophils Relative: 2 %
HCT: 31.7 % — ABNORMAL LOW (ref 36.0–46.0)
Hemoglobin: 9.2 g/dL — ABNORMAL LOW (ref 12.0–15.0)
Immature Granulocytes: 0 %
Lymphocytes Relative: 33 %
Lymphs Abs: 2.2 10*3/uL (ref 0.7–4.0)
MCH: 20 pg — ABNORMAL LOW (ref 26.0–34.0)
MCHC: 29 g/dL — ABNORMAL LOW (ref 30.0–36.0)
MCV: 68.9 fL — ABNORMAL LOW (ref 80.0–100.0)
Monocytes Absolute: 0.5 10*3/uL (ref 0.1–1.0)
Monocytes Relative: 8 %
Neutro Abs: 3.6 10*3/uL (ref 1.7–7.7)
Neutrophils Relative %: 56 %
Platelets: 255 10*3/uL (ref 150–400)
RBC: 4.6 MIL/uL (ref 3.87–5.11)
RDW: 18.3 % — ABNORMAL HIGH (ref 11.5–15.5)
WBC: 6.5 10*3/uL (ref 4.0–10.5)
nRBC: 0.3 % — ABNORMAL HIGH (ref 0.0–0.2)

## 2019-04-07 NOTE — Telephone Encounter (Signed)
The patient stated she would like a call in regards to her results. She stated she was told we were scheduling her for surgery however how check out notes states to return if symptoms worsen or fail to improve.

## 2019-04-07 NOTE — Telephone Encounter (Signed)
The patient called in stating she would like to speak with someone regarding surgery. She stated the doctor informed her once she recieves the results we will schedule for surgery but the last check out notes states to return if symptoms worsen or fail to improve.

## 2019-04-08 ENCOUNTER — Ambulatory Visit: Payer: Self-pay | Attending: Family Medicine | Admitting: Pharmacist

## 2019-04-08 ENCOUNTER — Telehealth: Payer: Self-pay

## 2019-04-08 ENCOUNTER — Other Ambulatory Visit: Payer: Self-pay

## 2019-04-08 ENCOUNTER — Telehealth: Payer: Self-pay | Admitting: Hematology

## 2019-04-08 DIAGNOSIS — Z7901 Long term (current) use of anticoagulants: Secondary | ICD-10-CM

## 2019-04-08 LAB — IRON AND TIBC
Iron: 22 ug/dL — ABNORMAL LOW (ref 41–142)
Saturation Ratios: 6 % — ABNORMAL LOW (ref 21–57)
TIBC: 374 ug/dL (ref 236–444)
UIBC: 352 ug/dL (ref 120–384)

## 2019-04-08 LAB — POCT INR: INR: 4.1 — AB (ref 2.0–3.0)

## 2019-04-08 LAB — FERRITIN: Ferritin: 5 ng/mL — ABNORMAL LOW (ref 11–307)

## 2019-04-08 MED FILL — WARFARIN SODIUM 5 MG TABLET: 5 | 30 days supply | Qty: 60 | Fill #2

## 2019-04-08 NOTE — Telephone Encounter (Signed)
Called patient to let her know her most recent lab results and that per Dr. Burr Medico she recommends coming in for an iron infusion this week and next, as well as returning for repeat labs and f/u with Dr. Burr Medico in a month. Encouraged her to take an OTC Calcium/VitD supplement and to increase the amount of protein in her daily diet. Patient verbalized understanding and agreement, and denied any further needs at this time.   High priority scheduling message sent for iron infusion x2, and repeat labs/follow up with Dr. Burr Medico in 1 month.

## 2019-04-08 NOTE — Telephone Encounter (Signed)
-----   Message from Truitt Merle, MD sent at 04/08/2019  1:56 PM EDT ----- Please let her know the lab results, worsening anemia, very low iron level, low alb and calcium level, please schedule iv iron this week and next week, and add lab monthly until see me next time. Encourage her to take Calcium and VITD OTC, and increase protein in diet. Thanks   Truitt Merle  04/08/2019

## 2019-04-08 NOTE — Telephone Encounter (Signed)
Scheduled appt per 9/22 sch message - pt aware of appt date and time   

## 2019-04-09 NOTE — Telephone Encounter (Signed)
I called Donna [name] and verified she had seen Dr.Arnold 8/27 for endometrial bx and plan was return in 2 weeks for results unless called sooner and schedule for  surgery . She states she has not had any calls; also I verfied no appointment scheduled.  I reviewed her endometrial biopsy results ( negative) and informed her I would send a message to registrars to schedule fu appt and call her or message her with appointment. I also informed her I would message Dr.Arnold and let him know I have given her results and endo bx and having appt scheduled and ask if he has any change in plan of care; if there is a change we will contact her. She voices understanding. Linda,RN

## 2019-04-11 ENCOUNTER — Inpatient Hospital Stay: Payer: Self-pay

## 2019-04-11 ENCOUNTER — Other Ambulatory Visit: Payer: Self-pay

## 2019-04-11 VITALS — BP 101/70 | HR 86 | Temp 98.7°F | Resp 19

## 2019-04-11 DIAGNOSIS — D509 Iron deficiency anemia, unspecified: Secondary | ICD-10-CM

## 2019-04-11 MED ORDER — SODIUM CHLORIDE 0.9 % IV SOLN
INTRAVENOUS | Status: DC
  Administered 2019-04-11: 15:00:00 via INTRAVENOUS
  Filled 2019-04-11: qty 250

## 2019-04-11 MED ORDER — SODIUM CHLORIDE 0.9 % IV SOLN
510.0000 mg | Freq: Once | INTRAVENOUS | Status: AC
  Administered 2019-04-11: 510 mg via INTRAVENOUS
  Filled 2019-04-11: qty 17

## 2019-04-11 NOTE — Patient Instructions (Signed)

## 2019-04-11 NOTE — Progress Notes (Signed)
Patient declined to remain for 30 minute post iron observation period.  

## 2019-04-18 ENCOUNTER — Other Ambulatory Visit: Payer: Self-pay

## 2019-04-18 ENCOUNTER — Inpatient Hospital Stay: Payer: Self-pay | Attending: Hematology

## 2019-04-18 VITALS — BP 99/66 | HR 88 | Temp 98.7°F | Resp 18

## 2019-04-18 DIAGNOSIS — D509 Iron deficiency anemia, unspecified: Secondary | ICD-10-CM | POA: Insufficient documentation

## 2019-04-18 MED ORDER — SODIUM CHLORIDE 0.9 % IV SOLN
510.0000 mg | Freq: Once | INTRAVENOUS | Status: AC
  Administered 2019-04-18: 510 mg via INTRAVENOUS
  Filled 2019-04-18: qty 510

## 2019-04-18 MED ORDER — SODIUM CHLORIDE 0.9 % IV SOLN
INTRAVENOUS | Status: DC
  Administered 2019-04-18: 15:00:00 via INTRAVENOUS
  Filled 2019-04-18: qty 250

## 2019-04-18 NOTE — Progress Notes (Signed)
Refused to stay for 30 minutes post observation after Feraheme. Vital signs obtained prior to being d/ced.

## 2019-04-18 NOTE — Patient Instructions (Signed)

## 2019-04-22 ENCOUNTER — Encounter: Payer: Medicaid Other | Admitting: Pharmacist

## 2019-05-02 ENCOUNTER — Inpatient Hospital Stay: Payer: Self-pay | Admitting: Hematology

## 2019-05-02 ENCOUNTER — Telehealth: Payer: Self-pay | Admitting: *Deleted

## 2019-05-02 ENCOUNTER — Inpatient Hospital Stay: Payer: Self-pay

## 2019-05-02 NOTE — Telephone Encounter (Signed)
Attempted to call patient regarding today's missed appt. No answer/no voicemail

## 2019-05-05 ENCOUNTER — Ambulatory Visit: Payer: Medicaid Other | Admitting: Obstetrics & Gynecology

## 2019-05-05 ENCOUNTER — Telehealth: Payer: Self-pay | Admitting: Hematology

## 2019-05-05 NOTE — Telephone Encounter (Signed)
Called pt per 10/16 sch message - no answer and no vmail - messaged MD to let her know

## 2019-05-15 ENCOUNTER — Other Ambulatory Visit: Payer: Self-pay | Admitting: Family Medicine

## 2019-05-15 MED FILL — WARFARIN SODIUM 5 MG TABLET: 5 | 30 days supply | Qty: 60 | Fill #0

## 2019-05-19 MED FILL — ATORVASTATIN CALCIUM 20 MG: 20 | 30 days supply | Qty: 30 | Fill #1

## 2019-05-20 ENCOUNTER — Other Ambulatory Visit: Payer: Self-pay

## 2019-05-20 ENCOUNTER — Ambulatory Visit: Payer: Self-pay | Attending: Family Medicine | Admitting: Pharmacist

## 2019-05-20 DIAGNOSIS — Z7901 Long term (current) use of anticoagulants: Secondary | ICD-10-CM

## 2019-05-20 LAB — POCT INR: INR: 1.8 — AB (ref 2.0–3.0)

## 2019-05-26 DIAGNOSIS — Z03818 Encounter for observation for suspected exposure to other biological agents ruled out: Secondary | ICD-10-CM | POA: Diagnosis not present

## 2019-05-27 ENCOUNTER — Encounter: Payer: Medicaid Other | Admitting: Pharmacist

## 2019-05-30 ENCOUNTER — Encounter: Payer: Self-pay | Admitting: Obstetrics & Gynecology

## 2019-05-30 ENCOUNTER — Telehealth (INDEPENDENT_AMBULATORY_CARE_PROVIDER_SITE_OTHER): Payer: Self-pay | Admitting: Obstetrics & Gynecology

## 2019-05-30 ENCOUNTER — Other Ambulatory Visit: Payer: Self-pay

## 2019-05-30 DIAGNOSIS — N920 Excessive and frequent menstruation with regular cycle: Secondary | ICD-10-CM

## 2019-05-30 DIAGNOSIS — D259 Leiomyoma of uterus, unspecified: Secondary | ICD-10-CM

## 2019-05-30 DIAGNOSIS — Z01818 Encounter for other preprocedural examination: Secondary | ICD-10-CM

## 2019-05-30 NOTE — Patient Instructions (Signed)
Hysterectomy Information  A hysterectomy is a surgery in which the uterus is removed. The fallopian tubes and ovaries may be removed (bilateral salpingo-oophorectomy) as well. This procedure may be done to treat various medical problems. After the procedure, a woman will no longer have menstrual periods nor will she be able to become pregnant (sterile). What are the reasons for a hysterectomy? There are many reasons why a woman might have this procedure. They include:  Persistent, abnormal vaginal bleeding.  Long-term (chronic) pelvic pain or infection.  Endometriosis. This is when the lining of the uterus (endometrium) starts to grow outside the uterus.  Adenomyosis. This is when the endometrium starts to grow in the muscle of the uterus.  Pelvic organ prolapse. This is a condition in which the uterus falls down into the vagina.  Noncancerous growths in the uterus (uterine fibroids) that cause symptoms.  The presence of precancerous cells.  Cervical or uterine cancer. What are the different types of hysterectomy? There are three different types of hysterectomy:  Supracervical hysterectomy. In this type, the top part of the uterus is removed, but not the cervix.  Total hysterectomy. In this type, the uterus and cervix are removed.  Radical hysterectomy. In this type, the uterus, the cervix, and the tissue that holds the uterus in place (parametrium) are removed. What are the different ways a hysterectomy can be performed? There are many different ways a hysterectomy can be performed, including:  Abdominal hysterectomy. In this type, an incision is made in the abdomen. The uterus is removed through this incision.  Vaginal hysterectomy. In this type, an incision is made in the vagina. The uterus is removed through this incision. There are no abdominal incisions.  Conventional laparoscopic hysterectomy. In this type, three or four small incisions are made in the abdomen. A thin,  lighted tube with a camera (laparoscope) is inserted into one of the incisions. Other tools are put through the other incisions. The uterus is cut into small pieces. The small pieces are removed through the incisions or through the vagina.  Laparoscopically assisted vaginal hysterectomy (LAVH). In this type, three or four small incisions are made in the abdomen. Part of the surgery is performed laparoscopically and the other part is done vaginally. The uterus is removed through the vagina.  Robot-assisted laparoscopic hysterectomy. In this type, a laparoscope and other tools are inserted into three or four small incisions in the abdomen. A computer-controlled device is used to give the surgeon a 3D image and to help control the surgical instruments. This allows for more precise movements of surgical instruments. The uterus is cut into small pieces and removed through the incisions or removed through the vagina. Discuss the options with your health care provider to determine which type is the right one for you. What are the risks? Generally, this is a safe procedure. However, problems may occur, including:  Bleeding and risk of blood transfusion. Tell your health care provider if you do not want to receive any blood products.  Blood clots in the legs or lung.  Infection.  Damage to other structures or organs.  Allergic reactions to medicines.  Changing to an abdominal hysterectomy from one of the other techniques. What to expect after a hysterectomy  You will be given pain medicine.  You may need to stay in the hospital for 1- 2 days to recover, depending on the type of hysterectomy you had.  Follow your health care provider's instructions about exercise, driving, and general activities. Ask your   health care provider what activities are safe for you.  You will need to have someone with you for the first 3-5 days after you go home.  You will need to follow up with your surgeon in 2-4  weeks after surgery to evaluate your progress.  If the ovaries are removed, you will have early menopause symptoms such as hot flashes, night sweats, and insomnia.  If you had a hysterectomy for a problem that was not cancer or not a condition that could lead to cancer, then you no longer need Pap tests. However, even if you no longer need a Pap test, a regular pelvic exam is a good idea to make sure no other problems are developing. Questions to ask your health care provider  Is a hysterectomy medically necessary? Do I have other treatment options for my condition?  What are my options for hysterectomy procedure?  What organs and tissues need to be removed?  What are the risks?  What are the benefits?  How long will I need to stay in the hospital after the procedure?  How long will I need to recover at home?  What symptoms can I expect after the procedure? Summary  A hysterectomy is a surgery in which the uterus is removed. The fallopian tubes and ovaries may be removed (bilateral salpingo-oophorectomy) as well.  This procedure may be done to treat various medical problems. After the procedure, a woman will no longer have menstrual periods nor will she be able to become pregnant.  Discuss the options with your health care provider to determine which type of hysterectomy is the right one for you. This information is not intended to replace advice given to you by your health care provider. Make sure you discuss any questions you have with your health care provider. Document Released: 12/27/2000 Document Revised: 06/15/2017 Document Reviewed: 08/09/2016 Elsevier Patient Education  2020 Elsevier Inc.  

## 2019-05-30 NOTE — Progress Notes (Signed)
Patient ID: Donna [name], female   DOB: January 10, 1971, 48 y.o.   MRN: [id number]   TELEHEALTH VIRTUAL GYNECOLOGY VISIT ENCOUNTER NOTE  I connected with Donna [name] on 05/30/19 at 10:15 AM EST by telephone at home and verified that I am speaking with the correct person using two identifiers.  I discussed the limitations, risks, security and privacy concerns of performing an evaluation and management service by telephone and the availability of in person appointments. I also discussed with the patient that there may be a patient responsible charge related to this service. The patient expressed understanding and agreed to proceed.   History:  Donna [name] is a 48 y.o. [id number] female being evaluated today for scheduling TAH BS  Previously discussed. Biopsy was benig. She denies any abnormal vaginal discharge, bleeding, pelvic pain or other concerns.       Past Medical History:  Diagnosis Date  . DVT (deep venous thrombosis) ([place]) 12/06/2012  . Lung collapse   . Pulmonary embolism affecting pregnancy    Past Surgical History:  Procedure Laterality Date  . RIB RESECTION     The following portions of the patient's history were reviewed and updated as appropriate: allergies, current medications, past family history, past medical history, past social history, past surgical history and problem list.   Health Maintenance:  Normal pap and negative HRHPV .  Bx Endometrium, biopsy - ENDOMETRIOID-TYPE POLYP. - NO MALIGNANCY IDENTIFIED  Review of Systems:  Pertinent items noted in HPI and remainder of comprehensive ROS otherwise negative.  Physical Exam:   General:  Alert, oriented and cooperative.   Mental Status: Normal mood and affect perceived. Normal judgment and thought content.  Physical exam deferred due to nature of the encounter  Labs and Imaging Results for orders placed or performed in visit on 05/20/19 (from the past 336 hour(s))  POCT INR   Collection Time: 05/20/19   1:50 PM  Result Value Ref Range   INR 1.8 (A) 2.0 - 3.0   No results found.    Assessment and Plan:     There are no diagnoses linked to this encounter.      I discussed the assessment and treatment plan with the patient. The patient was provided an opportunity to ask questions and all were answered. The patient agreed with the plan and demonstrated an understanding of the instructions.   The patient was advised to call back or seek an in-person evaluation/go to the ED if the symptoms worsen or if the condition fails to improve as anticipated. Elevated AFP    Patient desires surgical management. The risks of surgery were discussed in detail with the patient including but not limited to: bleeding which may require transfusion or reoperation; infection which may require prolonged hospitalization or re-hospitalization and antibiotic therapy; injury to bowel, bladder, ureters and major vessels or other surrounding organs; need for additional procedures including laparotomy; thromboembolic phenomenon, incisional problems and other postoperative or anesthesia complications.  Patient was told that the likelihood that her condition and symptoms will be treated effectively with this surgical management was very high; the postoperative expectations were also discussed in detail. The patient also understands the alternative treatment options which were discussed in full. All questions were answered.  She was told that she will be contacted by our surgical scheduler regarding the time and date of her surgery; routine preoperative instructions will be given to her by the preoperative nursing team.   She is aware of need for preoperative COVID testing  and subsequent quarantine from time of test to time of surgery; she will be given further preoperative instructions at that [place] screening visit. Printed patient education handouts about the procedure were given to the patient to review at home.   I provided 10  minutes of non-face-to-face time during this encounter.   Emeterio Reeve, MD [facility] [place]

## 2019-05-30 NOTE — Progress Notes (Signed)
I connected with  Donna [name] on 05/30/19 at 10:15 AM EST by telephone and verified that I am speaking with the correct person using two identifiers.   I discussed the limitations, risks, security and privacy concerns of performing an evaluation and management service by telephone and the availability of in person appointments. I also discussed with the patient that there may be a patient responsible charge related to this service. The patient expressed understanding and agreed to proceed.  Bethanne Ginger, CMA 05/30/2019  10:04 AM

## 2019-06-02 ENCOUNTER — Telehealth: Payer: Self-pay | Admitting: *Deleted

## 2019-06-02 DIAGNOSIS — D259 Leiomyoma of uterus, unspecified: Secondary | ICD-10-CM

## 2019-06-02 NOTE — Telephone Encounter (Signed)
Called pt and discussed the need for her to sign hysterectomy statement prior to her scheduled surgery on 07/15/19. She voiced understanding and stated she will come to office on 11/19 to sign the form. She had no questions.

## 2019-06-05 NOTE — Progress Notes (Signed)
Pt here today to sign hysterectomy statement.  Pt did not have any other questions.   Mel Almond, RN 06/05/19

## 2019-06-06 ENCOUNTER — Encounter: Payer: Self-pay | Admitting: *Deleted

## 2019-06-16 ENCOUNTER — Telehealth: Payer: Self-pay

## 2019-06-16 NOTE — Telephone Encounter (Signed)
Pt called and stated that she wanted to know if she would be able to bring anyone to her surgery she has scheduled next month.  Attempted to call pt was unable to LM due to VM box not set up yet.  Mychart message sent.

## 2019-06-17 ENCOUNTER — Other Ambulatory Visit: Payer: Self-pay

## 2019-06-17 ENCOUNTER — Ambulatory Visit: Payer: Self-pay | Attending: Family Medicine | Admitting: Pharmacist

## 2019-06-17 DIAGNOSIS — Z7901 Long term (current) use of anticoagulants: Secondary | ICD-10-CM

## 2019-06-17 LAB — POCT INR: INR: 3.9 — AB (ref 2.0–3.0)

## 2019-06-17 MED FILL — ATORVASTATIN CALCIUM 20 MG: 20 | 30 days supply | Qty: 30 | Fill #2

## 2019-06-17 MED FILL — WARFARIN SODIUM 5 MG TABLET: 5 | 30 days supply | Qty: 60 | Fill #1

## 2019-06-23 ENCOUNTER — Telehealth: Payer: Self-pay | Admitting: Hematology

## 2019-06-23 NOTE — Telephone Encounter (Signed)
YF PAL moved 12/21 visit to 12/29. Not able to reach patient at home or cell phone or leave message. Schedule mailed.

## 2019-06-24 ENCOUNTER — Ambulatory Visit: Payer: Medicaid Other | Admitting: Pharmacist

## 2019-06-25 ENCOUNTER — Ambulatory Visit (HOSPITAL_COMMUNITY)
Admission: EM | Admit: 2019-06-25 | Discharge: 2019-06-25 | Disposition: A | Discharge status: Home / Self Care | Payer: Self-pay | Attending: Family Medicine | Admitting: Family Medicine

## 2019-06-25 ENCOUNTER — Ambulatory Visit: Payer: Medicaid Other | Admitting: Pharmacist

## 2019-06-25 ENCOUNTER — Ambulatory Visit: Payer: Medicaid Other | Attending: Family Medicine | Admitting: Pharmacist

## 2019-06-25 ENCOUNTER — Encounter (HOSPITAL_COMMUNITY): Payer: Self-pay | Admitting: Emergency Medicine

## 2019-06-25 ENCOUNTER — Other Ambulatory Visit: Payer: Self-pay

## 2019-06-25 DIAGNOSIS — S91311A Laceration without foreign body, right foot, initial encounter: Secondary | ICD-10-CM

## 2019-06-25 DIAGNOSIS — Z7901 Long term (current) use of anticoagulants: Secondary | ICD-10-CM | POA: Insufficient documentation

## 2019-06-25 LAB — POCT INR: INR: 2.5 (ref 2.0–3.0)

## 2019-06-25 MED ORDER — TETANUS-DIPHTH-ACELL PERTUSSIS 5-2.5-18.5 LF-MCG/0.5 IM SUSP
INTRAMUSCULAR | Status: AC
  Filled 2019-06-25: qty 0.5

## 2019-06-25 MED ORDER — TETANUS-DIPHTH-ACELL PERTUSSIS 5-2.5-18.5 LF-MCG/0.5 IM SUSP
0.5000 mL | Freq: Once | INTRAMUSCULAR | Status: AC
  Administered 2019-06-25: 18:00:00 0.5 mL via INTRAMUSCULAR

## 2019-06-25 NOTE — ED Triage Notes (Addendum)
Patient flew home to [place] yesterday after being in [place].  Injury occurred in airport in [place]  There is a cut to the bottom of right foot, bleeding controlled.  Patient said it was a broke piece of rusty metal sticking out of the floor  Unsure when last tetanus was

## 2019-06-26 ENCOUNTER — Telehealth: Payer: Self-pay

## 2019-06-26 NOTE — ED Provider Notes (Signed)
[place]   [id number] 06/25/19 Arrival Time: [id number]  ASSESSMENT & PLAN:  1. Laceration of right foot, initial encounter     Meds ordered this encounter  Medications  . Tdap (BOOSTRIX) injection 0.5 mL   No signs of infection. She is comfortable with local wound care and observation. OTC analgesics if needed. WBAT.  Reviewed expectations re: course of current medical issues. Questions answered. Outlined signs and symptoms indicating need for more acute intervention. Patient verbalized understanding. After Visit Summary given.   SUBJECTIVE:  Donna [name] is a 48 y.o. female who presents with a laceration of her distal plantar R foot. Yesterday. Barefoot. Stepped on a small piece of metal which punctured her foot. Minimal bleeding. Has been able to bear weight without difficulty; "but it's sore". No extremity sensation changes or weakness. No specific aggravating or alleviating factors reported. No OTC analgesics taken/needed.  Td UTD: "Probably a very long time ago".  ROS: As per HPI.    OBJECTIVE:  Vitals:   06/25/19 1706  BP: 117/80  Pulse: 93  Resp: 16  Temp: 98.1 F (36.7 C)  TempSrc: Oral  SpO2: 98%     General appearance: alert; no distress Skin/RLE: linear laceration of distal R plantar foot; size: approx 0.5 cm; clean wound edges, no foreign bodies; without active bleeding; no obvious FB present; area around cut is tender to palpation; all toes with FROM and normal cap refill and normal sensation Psychological: alert and cooperative; normal mood and affect   No results found.  No Known Allergies  Past Medical History:  Diagnosis Date  . DVT (deep venous thrombosis) ([place]) 12/06/2012  . Lung collapse   . Pulmonary embolism affecting pregnancy    Social History   Socioeconomic History  . Marital status: Single    Spouse name: Not on file  . Number of children: Not on file  . Years of education: Not on file  . Highest education  level: Not on file  Occupational History  . Not on file  Tobacco Use  . Smoking status: Never Smoker  . Smokeless tobacco: Never Used  Substance and Sexual Activity  . Alcohol use: Yes    Comment: socially   . Drug use: No  . Sexual activity: Not on file  Other Topics Concern  . Not on file  Social History Narrative  . Not on file   Social Determinants of Health   Financial Resource Strain:   . Difficulty of Paying Living Expenses: Not on file  Food Insecurity:   . Worried About Charity fundraiser in the Last Year: Not on file  . Ran Out of Food in the Last Year: Not on file  Transportation Needs:   . Lack of Transportation (Medical): Not on file  . Lack of Transportation (Non-Medical): Not on file  Physical Activity:   . Days of Exercise per Week: Not on file  . Minutes of Exercise per Session: Not on file  Stress:   . Feeling of Stress : Not on file  Social Connections:   . Frequency of Communication with Friends and Family: Not on file  . Frequency of Social Gatherings with Friends and Family: Not on file  . Attends Religious Services: Not on file  . Active Member of Clubs or Organizations: Not on file  . Attends Archivist Meetings: Not on file  . Marital Status: Not on file         Vanessa Kick, MD 06/26/19 [phone]

## 2019-06-26 NOTE — Telephone Encounter (Signed)
Pt left message on nurse VM requesting a call back. Pt would like further instructions about how to take blood thinners prior to hysterectomy with Roselie Awkward, MD on 07/15/19.  Per Kennon Rounds, MD clinical staff will need to contact [facility] pharmacy to find out what anticoagulation medications are available to their patients. Pt may need bridge therapy prior to and after surgery.

## 2019-06-27 NOTE — Telephone Encounter (Signed)
Called pt to let her know that we are working on getting information on her Blood Thinner and will be back in toiuch on Monday, Pt did not answer and voicemail box was not set up yet to leave a message.

## 2019-06-30 NOTE — Telephone Encounter (Addendum)
[place] to find out what anticoagulation therapy is available prior to speaking with provider.  Wilhemina Cash, MD. Spoke with Rip Harbour, MD on his behalf; per Rip Harbour, MD pt's PCP has been consulted for surgical clearance and pt will need to follow up with PCP for anticoagulation plan.   Called pt to follow up. Pt states she has received instructions from PCP but wanted to make sure her surgeon was aware of the plan. Explained to pt that her providers are in communication. Pt states she will proceed with plan given to her by PCP.

## 2019-06-30 NOTE — Telephone Encounter (Signed)
1156am- Pt called in regards to her blood thinners.  Can someone call her back.

## 2019-07-02 ENCOUNTER — Other Ambulatory Visit: Payer: Self-pay | Admitting: Obstetrics & Gynecology

## 2019-07-07 ENCOUNTER — Other Ambulatory Visit: Payer: No Typology Code available for payment source

## 2019-07-07 ENCOUNTER — Ambulatory Visit: Payer: No Typology Code available for payment source | Admitting: Hematology

## 2019-07-08 ENCOUNTER — Other Ambulatory Visit: Payer: Self-pay

## 2019-07-08 ENCOUNTER — Ambulatory Visit: Payer: Medicaid Other | Attending: Family Medicine | Admitting: Pharmacist

## 2019-07-08 DIAGNOSIS — Z7901 Long term (current) use of anticoagulants: Secondary | ICD-10-CM

## 2019-07-08 LAB — POCT INR: INR: 1.8 — AB (ref 2.0–3.0)

## 2019-07-08 NOTE — Progress Notes (Signed)
[place], [address] [place] [address] Phone: [phone] Fax: [phone]    Your procedure is scheduled on Tuesday, December 29th .  Report to [facility] Main Entrance "A" at 1:10 P.M., and check in at the Admitting office.  Call this number if you have problems the morning of surgery:  [phone]  Call [phone] if you have any questions prior to your surgery date Monday-Friday 8am-4pm   Remember:  Do not eat after midnight the night before your surgery  You may drink clear liquids until 12:10 P.M. the afternoon of your surgery.   Clear liquids allowed are: Water, Non-Citrus Juices (without pulp), Carbonated Beverages, Clear Tea, Black Coffee Only, and Gatorade    Take these medicines the morning of surgery with A SIP OF WATER: NONE  Follow your surgeon's instructions on when to stop warfarin (COUMADIN).  If no instructions were given by your surgeon then you will need to call the office to get those instructions.    As of today, STOP taking any Aspirin (unless otherwise instructed by your surgeon), Aleve, Naproxen, Ibuprofen, Motrin, Advil, Goody's, BC's, all herbal medications, fish oil, and all vitamins.   The Morning of Surgery  Do not wear jewelry, make-up or nail polish.  Do not wear lotions, powders, perfumes,deodorant  Do not shave 48 hours prior to surgery.    Do not bring valuables to the hospital.  [facility] is not responsible for any belongings or valuables.  If you are a smoker, DO NOT Smoke 24 hours prior to surgery  If you wear a CPAP at night please bring your mask, tubing, and machine the morning of surgery   Remember that you must have someone to transport you home after your surgery, and remain with you for 24 hours if you are discharged the same day.  Please bring cases for contacts, glasses, hearing aids, dentures or bridgework because it cannot be worn into surgery.   Leave  your suitcase in the car.  After surgery it may be brought to your room.  For patients admitted to the hospital, discharge time will be determined by your treatment team.  Patients discharged the day of surgery will not be allowed to drive home.   Special instructions:   [place]- Preparing For Surgery  Before surgery, you can play an important role. Because skin is not sterile, your skin needs to be as free of germs as possible. You can reduce the number of germs on your skin by washing with CHG (chlorahexidine gluconate) Soap before surgery.  CHG is an antiseptic cleaner which kills germs and bonds with the skin to continue killing germs even after washing.    Oral Hygiene is also important to reduce your risk of infection.  Remember - BRUSH YOUR TEETH THE MORNING OF SURGERY WITH YOUR REGULAR TOOTHPASTE  Please do not use if you have an allergy to CHG or antibacterial soaps. If your skin becomes reddened/irritated stop using the CHG.  Do not shave (including legs and underarms) for at least 48 hours prior to first CHG shower. It is OK to shave your face.  Please follow these instructions carefully.   1. Shower the NIGHT BEFORE SURGERY and the MORNING OF SURGERY with CHG Soap.   2. If you chose to wash your hair, wash your hair first as usual with your normal shampoo.  3. After you shampoo, rinse your hair and body thoroughly to remove  the shampoo.  4. Use CHG as you would any other liquid soap. You can apply CHG directly to the skin and wash gently with a scrungie or a clean washcloth.   5. Apply the CHG Soap to your body ONLY FROM THE NECK DOWN.  Do not use on open wounds or open sores. Avoid contact with your eyes, ears, mouth and genitals (private parts). Wash Face and genitals (private parts)  with your normal soap.   6. Wash thoroughly, paying special attention to the area where your surgery will be performed.  7. Thoroughly rinse your body with warm water from the neck  down.  8. DO NOT shower/wash with your normal soap after using and rinsing off the CHG Soap.  9. Pat yourself dry with a CLEAN TOWEL.  10. Wear CLEAN PAJAMAS to bed the night before surgery, wear comfortable clothes the morning of surgery  11. Place CLEAN SHEETS on your bed the night of your first shower and DO NOT SLEEP WITH PETS.  Day of Surgery: Please shower the morning of surgery with the CHG soap Do not apply any deodorants/lotions. Please wear clean clothes to the hospital/surgery center.   Remember to brush your teeth WITH YOUR REGULAR TOOTHPASTE.  Please read over the following fact sheets that you were given.

## 2019-07-09 ENCOUNTER — Telehealth: Payer: Self-pay | Admitting: Pharmacist

## 2019-07-09 ENCOUNTER — Telehealth (INDEPENDENT_AMBULATORY_CARE_PROVIDER_SITE_OTHER): Payer: Self-pay | Admitting: Lactation Services

## 2019-07-09 ENCOUNTER — Other Ambulatory Visit: Payer: Self-pay

## 2019-07-09 ENCOUNTER — Encounter (HOSPITAL_COMMUNITY): Payer: Self-pay

## 2019-07-09 ENCOUNTER — Encounter (HOSPITAL_COMMUNITY)
Admission: RE | Admit: 2019-07-09 | Discharge: 2019-07-09 | Disposition: A | Discharge status: Home / Self Care | Payer: Medicaid Other | Source: Ambulatory Visit | Attending: Obstetrics & Gynecology | Admitting: Obstetrics & Gynecology

## 2019-07-09 DIAGNOSIS — Z86711 Personal history of pulmonary embolism: Secondary | ICD-10-CM | POA: Insufficient documentation

## 2019-07-09 DIAGNOSIS — D259 Leiomyoma of uterus, unspecified: Secondary | ICD-10-CM | POA: Insufficient documentation

## 2019-07-09 DIAGNOSIS — Z79899 Other long term (current) drug therapy: Secondary | ICD-10-CM | POA: Insufficient documentation

## 2019-07-09 DIAGNOSIS — Z86718 Personal history of other venous thrombosis and embolism: Secondary | ICD-10-CM | POA: Insufficient documentation

## 2019-07-09 DIAGNOSIS — Z01818 Encounter for other preprocedural examination: Secondary | ICD-10-CM | POA: Insufficient documentation

## 2019-07-09 DIAGNOSIS — Z7901 Long term (current) use of anticoagulants: Secondary | ICD-10-CM | POA: Insufficient documentation

## 2019-07-09 DIAGNOSIS — D509 Iron deficiency anemia, unspecified: Secondary | ICD-10-CM | POA: Insufficient documentation

## 2019-07-09 HISTORY — DX: Nausea with vomiting, unspecified: Z98.890

## 2019-07-09 HISTORY — DX: Other specified postprocedural states: R11.2

## 2019-07-09 LAB — BASIC METABOLIC PANEL
Anion gap: 8 (ref 5–15)
BUN: 13 mg/dL (ref 6–20)
CO2: 22 mmol/L (ref 22–32)
Calcium: 8.4 mg/dL — ABNORMAL LOW (ref 8.9–10.3)
Chloride: 107 mmol/L (ref 98–111)
Creatinine, Ser: 0.86 mg/dL (ref 0.44–1.00)
GFR calc Af Amer: >60 mL/min (ref >60)
GFR calc non Af Amer: >60 mL/min (ref >60)
Glucose, Bld: 109 mg/dL — ABNORMAL HIGH (ref 70–99)
Potassium: 4 mmol/L (ref 3.5–5.1)
Sodium: 137 mmol/L (ref 135–145)

## 2019-07-09 LAB — TYPE AND SCREEN
ABO/RH(D): B POS
Antibody Screen: NEGATIVE

## 2019-07-09 LAB — CBC
HCT: 35.7 % — ABNORMAL LOW (ref 36.0–46.0)
Hemoglobin: 10.8 g/dL — ABNORMAL LOW (ref 12.0–15.0)
MCH: 24.2 pg — ABNORMAL LOW (ref 26.0–34.0)
MCHC: 30.3 g/dL (ref 30.0–36.0)
MCV: 80 fL (ref 80.0–100.0)
Platelets: 293 10*3/uL (ref 150–400)
RBC: 4.46 MIL/uL (ref 3.87–5.11)
RDW: 20.3 % — ABNORMAL HIGH (ref 11.5–15.5)
WBC: 5.3 10*3/uL (ref 4.0–10.5)
nRBC: 0 % (ref 0.0–0.2)

## 2019-07-09 LAB — ABO/RH: ABO/RH(D): B POS

## 2019-07-09 MED ORDER — ENOXAPARIN SODIUM 120 MG/0.8ML ~~LOC~~ SOLN: 1.0000 mg/kg | Freq: Two times a day (BID) | SUBCUTANEOUS | 0 refills | Status: DC

## 2019-07-09 MED FILL — !LOVENOX 120 MG PREFILLED S: 120 | 12 days supply | Qty: 24 | Fill #0

## 2019-07-09 NOTE — Progress Notes (Signed)
PCP - Charlott Rakes, MD Cardiologist - Denies  PPM/ICD - N/A  Chest x-ray - N/A EKG - 07/09/2019 Stress Test - Denies ECHO - 07/17/14 Cardiac Cath - Denies  Sleep Study - Denies  Patient denies being a diabetic.  Blood Thinner Instructions: (Per patient) Per Dr. Margarita Rana, stop 5 days prior to sx, then take Lovenox shot for 2 days prior to sx. Aspirin Instructions: N/A  ERAS Protcol - Yeas PRE-SURGERY Ensure or G2-   COVID TEST- 07/12/2019   Anesthesia review: Yes, hx. DVT. On Coumadin  Patient denies shortness of breath, fever, cough and chest pain at PAT appointment  Coronavirus Screening Have you experienced the following symptoms:  Cough yes/no: No Fever (>100.17F)  yes/no: No Runny nose yes/no: No Sore throat yes/no: No Difficulty breathing/shortness of breath  yes/no: No  Have you or a family member traveled in the last 14 days and where? yes/no: No   If the patient indicates "YES" to the above questions, their PAT will be rescheduled to limit the exposure to others and, the surgeon will be notified. THE PATIENT WILL NEED TO BE ASYMPTOMATIC FOR 14 DAYS.   If the patient is not experiencing any of these symptoms, the PAT nurse will instruct them to NOT bring anyone with them to their appointment since they may have these symptoms or traveled as well.   Please remind your patients and families that hospital visitation restrictions are in effect and the importance of the restrictions.    All instructions explained to the patient, with a verbal understanding of the material. Patient agrees to go over the instructions while at home for a better understanding. Patient also instructed to self quarantine after being tested for COVID-19. The opportunity to ask questions was provided.

## 2019-07-09 NOTE — Telephone Encounter (Signed)
Pt called and left message on voicemail in regards to her blood thinners for surgery. Pt spoke with her PCP today who gave her the recommendations. Pt reports she has no further questions or concerns at this time.

## 2019-07-09 NOTE — Telephone Encounter (Signed)
Call placed to pt. Given her PMH of recurrent VTE, perioperative bridging recommended.   I discussed the following regimen with patient's PCP who is in agreement with the plan.   Day -5 (12/24); Thursday - hold warfarin   Day -4 :12/25); Friday - hold warfarin   Day -3 (12/26); Saturday - hold warfarin, begin 1 mg/kg BID Lovenox   Day -2 (12/27); Sunday - hold warfarin, continue 1 mg/kg BID Lovenox   Day -1 (12/28); Monday - hold warfarin, AM Lovenox ONLY (~24 hours before procedure)   Procedure day (12/29) - No anti-coag  Day 1 (12/30) - resume home warfarin regimen with Lovenox as deemed stable by her proceduralist

## 2019-07-10 NOTE — Progress Notes (Signed)
Anesthesia Chart Review:  Case: [id number] Date/Time: 07/15/19 1455   Procedure: HYSTERECTOMY ABDOMINAL WITH SALPINGECTOMY (Bilateral )   Anesthesia type: Choice   Pre-op diagnosis: Fibroids   Location: [place] OR ROOM 07 / [place] [facility]   Surgeons: [name], MD      DISCUSSION: Patient is a 48 year old Donna [name] scheduled for the above procedure.  History includes never smoker, postoperative N/V, DVT/PE (RLE DVT and small bilateral PE 12/05/12, RLL PE 07/16/14; by hematology note, lupus anticoagulant + and decreased Protein C levels 12/05/12, but repeat lupus anticoagulant negative with hypercoagulopathy work-up negative when off warfarin; lifelong anticoagulation recommended in no contraindication; on warfarin as she could not tolerate Xarelto), anemia of iron deficiency (likely related to menorrhagia 09/2018).  History also lists "lung collapse" and "rib resection".   BMI is consistent with morbid obesity.  Perioperative warfarin and Lovenox bridge instructions outlined in 07/09/19 telephone encounter by [name], [place].  Presurgical COVID-19 test is scheduled for 07/12/19. She also needs PT/INR and urine pregnancy test on the day of surgery. Anesthesia team to evaluate on the day of surgery.    VS: BP 113/73   Pulse 92   Temp 36.8 C (Oral)   Resp 18   Ht 5\' 10"  (1.778 m)   Wt 129.2 kg   LMP 07/05/2019   SpO2 100%   BMI 40.86 kg/m    PROVIDERS: Charlott Rakes, MD his PCP Truitt Merle, MD is hematologist   LABS: Labs reviewed: Acceptable for surgery. (all labs ordered are listed, but only abnormal results are displayed)  Labs Reviewed  BASIC METABOLIC PANEL - Abnormal; Notable for the following components:      Result Value   Glucose, Bld 109 (*)    Calcium 8.4 (*)    All other components within normal limits  CBC - Abnormal; Notable for the following components:   Hemoglobin 10.8 (*)    HCT 35.7 (*)    MCH 24.2 (*)    RDW 20.3 (*)    All other components within  normal limits  TYPE AND SCREEN  ABO/RH     IMAGES: CT abd/pelvis 12/23/18: IMPRESSION: 1. Punctate nonobstructing stone in the lower pole the left kidney. No evidence of hydronephrosis. 2. Significant interval increase in size of the patient's bulky heterogeneous uterus with new low-attenuation areas near the uterine fundus. Overall, the uterus currently measures 26 x 17 cm (previously measuring 15 x 12 cm on 02/19/2015). The heterogeneous appearance is favored to be secondary to underlying fibroids. Outpatient gynecology follow-up is recommended. An underlying mass cannot be excluded. 3. Significant mass effect is noted on the patient's urinary bladder from the enlarged uterus. 4. Hepatic steatosis.   EKG: 07/09/19: Normal sinus rhythm Non-specific ST-t changes No significant change since last tracing 12/08/16 Confirmed by Minus Breeding [phone]) on 07/09/2019 5:14:37 PM   CV: Echo 07/17/14 (in setting of acute PE): Study Conclusions  - Left ventricle: The cavity size was normal. Systolic function was  normal. The estimated ejection fraction was in the range of 60%  to 65%. Wall motion was normal; there were no regional wall  motion abnormalities.  - Right ventricle: Poorly visualized. The cavity size was mildly  dilated. Wall thickness was normal.    Past Medical History:  Diagnosis Date  . DVT (deep venous thrombosis) ([place]) 12/06/2012  . Lung collapse   . PONV (postoperative nausea and vomiting)   . Pulmonary embolism affecting pregnancy     Past Surgical History:  Procedure Laterality Date  .  RIB RESECTION      MEDICATIONS: . atorvastatin (LIPITOR) 20 MG tablet  . enoxaparin (LOVENOX) 120 MG/0.8ML injection  . ferrous sulfate 325 (65 FE) MG tablet  . warfarin (COUMADIN) 5 MG tablet   No current facility-administered medications for this encounter.     Donna Gianotti, PA-C Surgical Short Stay/Anesthesiology [facility] Phone [phone] [facility] Phone  [phone] 07/10/2019 10:56 AM

## 2019-07-10 NOTE — Anesthesia Preprocedure Evaluation (Addendum)
Anesthesia Evaluation  Patient identified by MRN, date of birth, ID band Patient awake    Reviewed: Allergy & Precautions, NPO status , Patient's Chart, lab work & pertinent test results  History of Anesthesia Complications (+) PONV and history of anesthetic complications  Airway Mallampati: II  TM Distance: >3 FB Neck ROM: Full    Dental  (+) Dental Advisory Given, Poor Dentition, Loose   Pulmonary  Hx of PE   breath sounds clear to auscultation       Cardiovascular negative cardio ROS   Rhythm:Regular Rate:Normal     Neuro/Psych negative neurological ROS  negative psych ROS   GI/Hepatic negative GI ROS, Neg liver ROS,   Endo/Other  negative endocrine ROS  Renal/GU negative Renal ROS     Musculoskeletal   Abdominal   Peds  Hematology negative hematology ROS (+)   Anesthesia Other Findings   Reproductive/Obstetrics Menorrhagia                          Anesthesia Physical Anesthesia Plan  ASA: III  Anesthesia Plan: General   Post-op Pain Management:    Induction: Intravenous  PONV Risk Score and Plan: Ondansetron, Dexamethasone, Treatment may vary due to age or medical condition and Scopolamine patch - Pre-op  Airway Management Planned: Oral ETT  Additional Equipment:   Intra-op Plan:   Post-operative Plan: Extubation in OR  Informed Consent: I have reviewed the patients History and Physical, chart, labs and discussed the procedure including the risks, benefits and alternatives for the proposed anesthesia with the patient or authorized representative who has indicated his/her understanding and acceptance.     Dental advisory given  Plan Discussed with: CRNA and Anesthesiologist  Anesthesia Plan Comments: (PAT note written 07/10/2019 by [name], PA-C. )      Anesthesia Quick Evaluation

## 2019-07-12 ENCOUNTER — Other Ambulatory Visit (HOSPITAL_COMMUNITY)
Admission: RE | Admit: 2019-07-12 | Discharge: 2019-07-12 | Disposition: A | Discharge status: Home / Self Care | Payer: Medicaid Other | Source: Ambulatory Visit | Attending: Obstetrics & Gynecology | Admitting: Obstetrics & Gynecology

## 2019-07-12 NOTE — Progress Notes (Signed)
Patient tested positive for covid 19 within the last ninety days prior to upcoming procedure. Patient to bring in copy of positive result on DOS.

## 2019-07-14 ENCOUNTER — Telehealth: Payer: Self-pay | Admitting: *Deleted

## 2019-07-14 ENCOUNTER — Ambulatory Visit (HOSPITAL_COMMUNITY)
Admission: EM | Admit: 2019-07-14 | Discharge: 2019-07-14 | Disposition: A | Discharge status: Home / Self Care | Payer: Self-pay | Attending: Family Medicine | Admitting: Family Medicine

## 2019-07-14 ENCOUNTER — Encounter (HOSPITAL_COMMUNITY): Payer: Self-pay | Admitting: Emergency Medicine

## 2019-07-14 ENCOUNTER — Other Ambulatory Visit: Payer: Self-pay

## 2019-07-14 ENCOUNTER — Ambulatory Visit (INDEPENDENT_AMBULATORY_CARE_PROVIDER_SITE_OTHER): Payer: Self-pay

## 2019-07-14 DIAGNOSIS — S91331D Puncture wound without foreign body, right foot, subsequent encounter: Secondary | ICD-10-CM

## 2019-07-14 DIAGNOSIS — W268XXD Contact with other sharp object(s), not elsewhere classified, subsequent encounter: Secondary | ICD-10-CM

## 2019-07-14 DIAGNOSIS — M79671 Pain in right foot: Secondary | ICD-10-CM

## 2019-07-14 MED ORDER — DEXTROSE 5 % IV SOLN
3.0000 g | INTRAVENOUS | Status: AC
  Administered 2019-07-15: 17:00:00 3 g via INTRAVENOUS
  Filled 2019-07-14: qty 3

## 2019-07-14 MED ORDER — CEPHALEXIN 500 MG PO CAPS: 500.0000 mg | ORAL_CAPSULE | Freq: Two times a day (BID) | ORAL | 0 refills | Status: DC

## 2019-07-14 NOTE — Telephone Encounter (Signed)
After speaking with my coworkers and I called Hanh back and informed her as we understand it she can have one person with her ; but they will be screened also and must wear a mask and must be same person her whole stay. I informed her guidelines have changed several times and I do not work at the hospital. I advised her she can call [facility] preadmission and ask as well. She voices understanding. Linda,RN

## 2019-07-14 NOTE — ED Triage Notes (Signed)
PT cut foot 12/9. She reports continued shooting pain and discomfort in area of injury.   She has fibroid surgery scheduled for tomorrow.

## 2019-07-14 NOTE — ED Provider Notes (Signed)
[place]    CSN: [id number] Arrival date & time: 07/14/19  1549      History   Chief Complaint Chief Complaint  Patient presents with  . Foot Pain    [name] is a 48 y.o. female.   HPI  Injured foot on 06/24/2019.  Stepped down onto a floor barefoot and there was [place]  She had bleeding immediately.  Pain immediately.  She has been keeping it clean and trying to stay off blood, however, she has redness pain and swelling that persists.  It is now been 20 days.  Past Medical History:  Diagnosis Date  . DVT (deep venous thrombosis) ([place]) 12/06/2012  . Lung collapse   . PONV (postoperative nausea and vomiting)   . Pulmonary embolism affecting pregnancy     Patient Active Problem List   Diagnosis Date Noted  . Fibroid uterus 01/20/2019  . Hyperlipidemia 10/17/2017  . Hypercoagulable state ([place]) 09/11/2017  . Personal history of venous thrombosis and embolism 08/01/2015  . Venous thrombosis 08/12/2014  . 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 Iron deficiency anemia 07/16/2014  . Pleuritic chest pain 07/16/2014  . High risk medication use 02/12/2013  . Chronic anticoagulation 02/12/2013  . Healthcare maintenance 02/12/2013  . Obesity 12/30/2012  . Menorrhagia 12/16/2012  . Pulmonary embolism ([place]) 12/05/2012  . Anemia 12/05/2012    Past Surgical History:  Procedure Laterality Date  . RIB RESECTION      OB History    Gravida  1   Para  1   Term  1   Preterm  0   AB  0   Living  1     SAB  0   TAB  0   Ectopic  0   Multiple  0   Live Births               Home Medications    Prior to Admission medications   Medication Sig Start Date End Date Taking? Authorizing Provider  atorvastatin (LIPITOR) 20 MG tablet Take 1 tablet (20 mg total) by mouth daily. Patient taking differently: Take 20 mg by mouth at bedtime.  03/18/19  Yes Newlin, Charlane Ferretti, MD   enoxaparin (LOVENOX) 120 MG/0.8ML injection Inject 0.86 mLs (130 mg total) into the skin every 12 (twelve) hours. 07/09/19  Yes Charlott Rakes, MD  warfarin (COUMADIN) 5 MG tablet TAKE 2 TABLETS BY MOUTH DAILY. Patient taking differently: Take 5-10 mg by mouth See admin instructions. Take 5 mg on Monday and Friday All the other days take 10 mg 05/15/19  Yes Newlin, Enobong, MD  cephALEXin (KEFLEX) 500 MG capsule Take 1 capsule (500 mg total) by mouth 2 (two) times daily. 07/14/19   Raylene Everts, MD  ferrous sulfate 325 (65 FE) MG tablet One tablet orally twice daily with a meal Patient taking differently: Take 325 mg by mouth 2 (two) times daily with a meal. Some times take 650 mg 01/12/16   Truitt Merle, MD    Family History Family History  Problem Relation Age of Onset  . COPD Father   . Stroke Father     Social History Social History   Tobacco Use  . Smoking status: Never Smoker  . Smokeless tobacco: Never Used  Substance Use Topics  . Alcohol use: Yes    Comment: socially   . Drug use: No     Allergies   Patient has  no known allergies.   Review of Systems Review of Systems  Constitutional: Negative for chills and fever.  HENT: Negative for congestion and hearing loss.   Eyes: Negative for pain.  Respiratory: Negative for cough and shortness of breath.   Cardiovascular: Negative for chest pain and leg swelling.  Gastrointestinal: Negative for abdominal pain, constipation and diarrhea.  Genitourinary: Negative for dysuria and frequency.  Musculoskeletal: Positive for gait problem. Negative for myalgias.  Skin: Positive for wound.  Neurological: Negative for dizziness, seizures and headaches.  Psychiatric/Behavioral: The patient is not nervous/anxious.      Physical Exam Triage Vital Signs ED Triage Vitals  Enc Vitals Group     BP 07/14/19 1651 112/79     Pulse Rate 07/14/19 1649 94     Resp 07/14/19 1649 16     Temp 07/14/19 1649 98.3 F (36.8 C)      Temp src --      SpO2 07/14/19 1649 98 %     Weight --      Height --      Head Circumference --      Peak Flow --      Pain Score 07/14/19 1647 8     Pain Loc --      Pain Edu? --      Excl. in [place]? --    No data found.  Updated Vital Signs BP 112/79   Pulse 94   Temp 98.3 F (36.8 C)   Resp 16   LMP 07/05/2019 (Exact Date)   SpO2 98%      Physical Exam Constitutional:      General: She is not in acute distress.    Appearance: She is well-developed.  HENT:     Head: Normocephalic and atraumatic.  Eyes:     Conjunctiva/sclera: Conjunctivae normal.     Pupils: Pupils are equal, round, and reactive to light.  Cardiovascular:     Rate and Rhythm: Normal rate.  Pulmonary:     Effort: Pulmonary effort is normal. No respiratory distress.  Abdominal:     General: There is no distension.     Palpations: Abdomen is soft.  Musculoskeletal:        General: Normal range of motion.     Cervical back: Normal range of motion.  Skin:    General: Skin is warm and dry.     Comments: The plantar aspect of the right foot has erythema around the ball of the foot and a small puncture wound that is healing.  Very tender to palpation, tender to palpation of the distal metatarsals  Neurological:     Mental Status: She is alert.  Psychiatric:        Mood and Affect: Mood normal.        Behavior: Behavior normal.      UC Treatments / Results  Labs (all labs ordered are listed, but only abnormal results are displayed) Labs Reviewed - No data to display  EKG   Radiology DG Foot Complete Right  Result Date: 07/14/2019 CLINICAL DATA:  Per pt: stepped on a piece of metal sticking up out of the floor 06/24/19. Pain is the right foot, wound opening is the head of the 2nd metatarsal, plantar surface wound. No prior injury to the right foot. Patient is not a diabeticpuncture wound EXAM: RIGHT FOOT COMPLETE - 3+ VIEW COMPARISON:  None. FINDINGS: No fracture or dislocation of mid foot or  forefoot. The phalanges are normal. The calcaneus is normal. No  soft tissue abnormality. No foreign body. IMPRESSION: No fracture or foreign body. Electronically Signed   By: Suzy Bouchard M.D.   On: 07/14/2019 19:34    Procedures Procedures (including critical care time)  Medications Ordered in UC Medications - No data to display  Initial Impression / Assessment and Plan / UC Course  I have reviewed the triage vital signs and the nursing notes.  Pertinent labs & imaging results that were available during my care of the patient were reviewed by me and considered in my medical decision making (see chart for details).     The only reason I can think of that it would be still painful red and swollen is infection.  Will treated and have ` her return if fails to improve Final Clinical Impressions(s) / UC Diagnoses   Final diagnoses:  Foot pain, right  Puncture wound of right foot, subsequent encounter     Discharge Instructions     Your x-ray is negative I think there might be some infection causing the pain Take antibiotic 2 times a day Call or return if not improved in a week    ED Prescriptions    Medication Sig Dispense Auth. Provider   cephALEXin (KEFLEX) 500 MG capsule Take 1 capsule (500 mg total) by mouth 2 (two) times daily. 10 capsule Raylene Everts, MD     PDMP not reviewed this encounter.   Raylene Everts, MD 07/14/19 2138

## 2019-07-14 NOTE — Discharge Instructions (Signed)
Your x-ray is negative I think there might be some infection causing the pain Take antibiotic 2 times a day Call or return if not improved in a week

## 2019-07-14 NOTE — Telephone Encounter (Signed)
Donna [name] left a message 07/10/19 2:45 pm stating she is having surgery 07/15/19 and wants to know the covid restrictions- like can she have someone stay with her. Usiel Astarita,RN

## 2019-07-15 ENCOUNTER — Inpatient Hospital Stay: Payer: Self-pay | Admitting: Hematology

## 2019-07-15 ENCOUNTER — Inpatient Hospital Stay (HOSPITAL_COMMUNITY)
Admission: RE | Admit: 2019-07-15 | Discharge: 2019-07-17 | DRG: 743 | Disposition: A | Discharge status: Home / Self Care | Payer: Self-pay | Attending: Obstetrics & Gynecology | Admitting: Obstetrics & Gynecology

## 2019-07-15 ENCOUNTER — Inpatient Hospital Stay: Payer: Self-pay

## 2019-07-15 ENCOUNTER — Inpatient Hospital Stay (HOSPITAL_COMMUNITY): Payer: Self-pay | Admitting: Anesthesiology

## 2019-07-15 ENCOUNTER — Inpatient Hospital Stay (HOSPITAL_COMMUNITY): Payer: Self-pay | Admitting: Vascular Surgery

## 2019-07-15 ENCOUNTER — Encounter (HOSPITAL_COMMUNITY)
Admission: RE | Disposition: A | Discharge status: Home / Self Care | Payer: Self-pay | Source: Home / Self Care | Attending: Obstetrics & Gynecology

## 2019-07-15 ENCOUNTER — Other Ambulatory Visit: Payer: Self-pay

## 2019-07-15 ENCOUNTER — Encounter (HOSPITAL_COMMUNITY): Payer: Self-pay | Admitting: Obstetrics & Gynecology

## 2019-07-15 DIAGNOSIS — D251 Intramural leiomyoma of uterus: Principal | ICD-10-CM | POA: Diagnosis present

## 2019-07-15 DIAGNOSIS — N939 Abnormal uterine and vaginal bleeding, unspecified: Secondary | ICD-10-CM

## 2019-07-15 DIAGNOSIS — Z823 Family history of stroke: Secondary | ICD-10-CM

## 2019-07-15 DIAGNOSIS — E785 Hyperlipidemia, unspecified: Secondary | ICD-10-CM | POA: Diagnosis present

## 2019-07-15 DIAGNOSIS — Z825 Family history of asthma and other chronic lower respiratory diseases: Secondary | ICD-10-CM

## 2019-07-15 DIAGNOSIS — N838 Other noninflammatory disorders of ovary, fallopian tube and broad ligament: Secondary | ICD-10-CM | POA: Diagnosis present

## 2019-07-15 DIAGNOSIS — D250 Submucous leiomyoma of uterus: Secondary | ICD-10-CM

## 2019-07-15 DIAGNOSIS — D259 Leiomyoma of uterus, unspecified: Secondary | ICD-10-CM | POA: Diagnosis present

## 2019-07-15 DIAGNOSIS — Z7901 Long term (current) use of anticoagulants: Secondary | ICD-10-CM

## 2019-07-15 DIAGNOSIS — Z79899 Other long term (current) drug therapy: Secondary | ICD-10-CM

## 2019-07-15 DIAGNOSIS — Z86711 Personal history of pulmonary embolism: Secondary | ICD-10-CM

## 2019-07-15 HISTORY — PX: HYSTERECTOMY ABDOMINAL WITH SALPINGECTOMY: SHX6725

## 2019-07-15 LAB — CBC
HCT: 32.8 % — ABNORMAL LOW (ref 36.0–46.0)
Hemoglobin: 9.8 g/dL — ABNORMAL LOW (ref 12.0–15.0)
MCH: 24.2 pg — ABNORMAL LOW (ref 26.0–34.0)
MCHC: 29.9 g/dL — ABNORMAL LOW (ref 30.0–36.0)
MCV: 81 fL (ref 80.0–100.0)
Platelets: 387 10*3/uL (ref 150–400)
RBC: 4.05 MIL/uL (ref 3.87–5.11)
RDW: 18.9 % — ABNORMAL HIGH (ref 11.5–15.5)
WBC: 17 10*3/uL — ABNORMAL HIGH (ref 4.0–10.5)
nRBC: 0 % (ref 0.0–0.2)

## 2019-07-15 LAB — PROTIME-INR
INR: 1 (ref 0.8–1.2)
Prothrombin Time: 12.7 seconds (ref 11.4–15.2)

## 2019-07-15 LAB — POCT PREGNANCY, URINE: Preg Test, Ur: NEGATIVE

## 2019-07-15 LAB — CREATININE, SERUM
Creatinine, Ser: 1 mg/dL (ref 0.44–1.00)
GFR calc Af Amer: >60 mL/min (ref >60)
GFR calc non Af Amer: >60 mL/min (ref >60)

## 2019-07-15 SURGERY — HYSTERECTOMY, TOTAL, ABDOMINAL, WITH SALPINGECTOMY
Anesthesia: General | Site: Abdomen | Laterality: Bilateral

## 2019-07-15 MED ORDER — BUPIVACAINE HCL (PF) 0.5 % IJ SOLN
INTRAMUSCULAR | Status: AC
  Filled 2019-07-15: qty 30

## 2019-07-15 MED ORDER — DEXAMETHASONE SODIUM PHOSPHATE 10 MG/ML IJ SOLN
INTRAMUSCULAR | Status: DC | PRN
  Administered 2019-07-15: 8 mg via INTRAVENOUS

## 2019-07-15 MED ORDER — FENTANYL CITRATE (PF) 250 MCG/5ML IJ SOLN
INTRAMUSCULAR | Status: AC
  Filled 2019-07-15: qty 5

## 2019-07-15 MED ORDER — SCOPOLAMINE 1 MG/3DAYS TD PT72
1.0000 | MEDICATED_PATCH | TRANSDERMAL | Status: DC
  Administered 2019-07-15: 1.5 mg via TRANSDERMAL

## 2019-07-15 MED ORDER — SCOPOLAMINE 1 MG/3DAYS TD PT72
MEDICATED_PATCH | TRANSDERMAL | Status: AC
  Filled 2019-07-15: qty 1

## 2019-07-15 MED ORDER — OXYCODONE HCL 5 MG PO TABS
5.0000 mg | ORAL_TABLET | ORAL | Status: DC | PRN
  Administered 2019-07-15 – 2019-07-17 (×9): 10 mg via ORAL
  Filled 2019-07-15 (×9): qty 2

## 2019-07-15 MED ORDER — OXYCODONE HCL 5 MG PO TABS: 5.0000 mg | ORAL_TABLET | Freq: Once | ORAL | Status: DC | PRN

## 2019-07-15 MED ORDER — PHENYLEPHRINE HCL-NACL 10-0.9 MG/250ML-% IV SOLN
INTRAVENOUS | Status: DC | PRN
  Administered 2019-07-15: 30 ug/min via INTRAVENOUS

## 2019-07-15 MED ORDER — PROPOFOL 10 MG/ML IV BOLUS
INTRAVENOUS | Status: DC | PRN
  Administered 2019-07-15: 200 mg via INTRAVENOUS

## 2019-07-15 MED ORDER — ONDANSETRON HCL 4 MG/2ML IJ SOLN
INTRAMUSCULAR | Status: AC
  Filled 2019-07-15: qty 2

## 2019-07-15 MED ORDER — ZOLPIDEM TARTRATE 5 MG PO TABS: 5.0000 mg | ORAL_TABLET | Freq: Every evening | ORAL | Status: DC | PRN

## 2019-07-15 MED ORDER — ONDANSETRON HCL 4 MG PO TABS: 4.0000 mg | ORAL_TABLET | Freq: Four times a day (QID) | ORAL | Status: DC | PRN

## 2019-07-15 MED ORDER — FENTANYL CITRATE (PF) 100 MCG/2ML IJ SOLN
INTRAMUSCULAR | Status: AC
  Filled 2019-07-15: qty 2

## 2019-07-15 MED ORDER — 0.9 % SODIUM CHLORIDE (POUR BTL) OPTIME
TOPICAL | Status: DC | PRN
  Administered 2019-07-15: 18:00:00 1000 mL

## 2019-07-15 MED ORDER — DEXAMETHASONE SODIUM PHOSPHATE 10 MG/ML IJ SOLN
INTRAMUSCULAR | Status: AC
  Filled 2019-07-15: qty 1

## 2019-07-15 MED ORDER — PHENYLEPHRINE 40 MCG/ML (10ML) SYRINGE FOR IV PUSH (FOR BLOOD PRESSURE SUPPORT)
PREFILLED_SYRINGE | INTRAVENOUS | Status: DC | PRN
  Administered 2019-07-15 (×2): 120 ug via INTRAVENOUS

## 2019-07-15 MED ORDER — ROCURONIUM BROMIDE 10 MG/ML (PF) SYRINGE
PREFILLED_SYRINGE | INTRAVENOUS | Status: AC
  Filled 2019-07-15: qty 10

## 2019-07-15 MED ORDER — MIDAZOLAM HCL 5 MG/5ML IJ SOLN
INTRAMUSCULAR | Status: DC | PRN
  Administered 2019-07-15: 2 mg via INTRAVENOUS

## 2019-07-15 MED ORDER — ONDANSETRON HCL 4 MG/2ML IJ SOLN: 4.0000 mg | Freq: Four times a day (QID) | INTRAMUSCULAR | Status: DC | PRN

## 2019-07-15 MED ORDER — GABAPENTIN 300 MG PO CAPS
ORAL_CAPSULE | ORAL | Status: AC
  Administered 2019-07-15: 17:00:00 300 mg via ORAL
  Filled 2019-07-15: qty 1

## 2019-07-15 MED ORDER — FENTANYL CITRATE (PF) 100 MCG/2ML IJ SOLN
INTRAMUSCULAR | Status: DC | PRN
  Administered 2019-07-15 (×2): 50 ug via INTRAVENOUS
  Administered 2019-07-15: 150 ug via INTRAVENOUS

## 2019-07-15 MED ORDER — OXYCODONE HCL 5 MG/5ML PO SOLN: 5.0000 mg | Freq: Once | ORAL | Status: DC | PRN

## 2019-07-15 MED ORDER — LACTATED RINGERS IV SOLN: INTRAVENOUS | Status: DC

## 2019-07-15 MED ORDER — BUPIVACAINE HCL (PF) 0.5 % IJ SOLN
INTRAMUSCULAR | Status: DC | PRN
  Administered 2019-07-15: 30 mL

## 2019-07-15 MED ORDER — PROMETHAZINE HCL 25 MG/ML IJ SOLN: 6.2500 mg | INTRAMUSCULAR | Status: DC | PRN

## 2019-07-15 MED ORDER — LIDOCAINE 2% (20 MG/ML) 5 ML SYRINGE
INTRAMUSCULAR | Status: DC | PRN
  Administered 2019-07-15: 100 mg via INTRAVENOUS

## 2019-07-15 MED ORDER — CELECOXIB 200 MG PO CAPS
ORAL_CAPSULE | ORAL | Status: AC
  Administered 2019-07-15: 17:00:00 200 mg via ORAL
  Filled 2019-07-15: qty 1

## 2019-07-15 MED ORDER — LIDOCAINE 2% (20 MG/ML) 5 ML SYRINGE
INTRAMUSCULAR | Status: AC
  Filled 2019-07-15: qty 5

## 2019-07-15 MED ORDER — MIDAZOLAM HCL 2 MG/2ML IJ SOLN
INTRAMUSCULAR | Status: AC
  Filled 2019-07-15: qty 2

## 2019-07-15 MED ORDER — ONDANSETRON HCL 4 MG/2ML IJ SOLN
INTRAMUSCULAR | Status: DC | PRN
  Administered 2019-07-15: 4 mg via INTRAVENOUS

## 2019-07-15 MED ORDER — ROCURONIUM BROMIDE 10 MG/ML (PF) SYRINGE
PREFILLED_SYRINGE | INTRAVENOUS | Status: DC | PRN
  Administered 2019-07-15: 20 mg via INTRAVENOUS
  Administered 2019-07-15: 50 mg via INTRAVENOUS

## 2019-07-15 MED ORDER — ACETAMINOPHEN 500 MG PO TABS
ORAL_TABLET | ORAL | Status: AC
  Administered 2019-07-15: 17:00:00 1000 mg via ORAL
  Filled 2019-07-15: qty 2

## 2019-07-15 MED ORDER — SUCCINYLCHOLINE CHLORIDE 200 MG/10ML IV SOSY
PREFILLED_SYRINGE | INTRAVENOUS | Status: DC | PRN
  Administered 2019-07-15: 140 mg via INTRAVENOUS

## 2019-07-15 MED ORDER — FENTANYL CITRATE (PF) 100 MCG/2ML IJ SOLN
25.0000 ug | INTRAMUSCULAR | Status: DC | PRN
  Administered 2019-07-15: 50 ug via INTRAVENOUS

## 2019-07-15 MED ORDER — ENOXAPARIN SODIUM 40 MG/0.4ML ~~LOC~~ SOLN
40.0000 mg | SUBCUTANEOUS | Status: DC
  Administered 2019-07-16: 40 mg via SUBCUTANEOUS
  Filled 2019-07-15: qty 0.4

## 2019-07-15 MED ORDER — PROPOFOL 10 MG/ML IV BOLUS
INTRAVENOUS | Status: AC
  Filled 2019-07-15: qty 20

## 2019-07-15 MED ORDER — SUGAMMADEX SODIUM 200 MG/2ML IV SOLN
INTRAVENOUS | Status: DC | PRN
  Administered 2019-07-15: 200 mg via INTRAVENOUS

## 2019-07-15 MED ORDER — HYDROMORPHONE HCL 1 MG/ML IJ SOLN
1.0000 mg | INTRAMUSCULAR | Status: DC | PRN
  Administered 2019-07-15 – 2019-07-16 (×4): 1 mg via INTRAVENOUS
  Filled 2019-07-15 (×4): qty 1

## 2019-07-15 MED FILL — CEPHALEXIN 500 MG CAPSULE: 500 | 5 days supply | Qty: 10 | Fill #0

## 2019-07-15 SURGICAL SUPPLY — 41 items
CANISTER SUCT 3000ML PPV (MISCELLANEOUS) ×6 IMPLANT
COVER WAND RF STERILE (DRAPES) ×1 IMPLANT
DRAPE HALF SHEET 40X57 (DRAPES) ×2 IMPLANT
DRAPE LAPAROSCOPIC ABDOMINAL (DRAPES) ×2 IMPLANT
DRAPE WARM FLUID 44X44 (DRAPES) ×2 IMPLANT
DRSG OPSITE POSTOP 4X10 (GAUZE/BANDAGES/DRESSINGS) ×3 IMPLANT
DURAPREP 26ML APPLICATOR (WOUND CARE) ×3 IMPLANT
GAUZE 4X4 16PLY RFD (DISPOSABLE) IMPLANT
GLOVE BIO SURGEON STRL SZ 6.5 (GLOVE) ×2 IMPLANT
GLOVE BIO SURGEONS STRL SZ 6.5 (GLOVE) ×1
GLOVE BIOGEL M 6.5 STRL (GLOVE) ×2 IMPLANT
GLOVE BIOGEL PI IND STRL 6.5 (GLOVE) IMPLANT
GLOVE BIOGEL PI IND STRL 7.0 (GLOVE) ×3 IMPLANT
GLOVE BIOGEL PI INDICATOR 6.5 (GLOVE) ×4
GLOVE BIOGEL PI INDICATOR 7.0 (GLOVE) ×6
GOWN STRL REUS W/ TWL LRG LVL3 (GOWN DISPOSABLE) ×3 IMPLANT
GOWN STRL REUS W/TWL LRG LVL3 (GOWN DISPOSABLE) ×12
HIBICLENS CHG 4% 4OZ BTL (MISCELLANEOUS) ×3 IMPLANT
KIT TURNOVER KIT B (KITS) ×3 IMPLANT
LIGASURE IMPACT 36 18CM CVD LR (INSTRUMENTS) ×2 IMPLANT
NEEDLE HYPO 22GX1.5 SAFETY (NEEDLE) ×3 IMPLANT
NS IRRIG 1000ML POUR BTL (IV SOLUTION) ×3 IMPLANT
PACK ABDOMINAL GYN (CUSTOM PROCEDURE TRAY) ×3 IMPLANT
PAD ARMBOARD 7.5X6 YLW CONV (MISCELLANEOUS) ×3 IMPLANT
PAD OB MATERNITY 4.3X12.25 (PERSONAL CARE ITEMS) ×3 IMPLANT
SPECIMEN JAR MEDIUM (MISCELLANEOUS) ×1 IMPLANT
SPONGE LAP 18X18 RF (DISPOSABLE) ×10 IMPLANT
STAPLER VISISTAT 35W (STAPLE) IMPLANT
SUT PLAIN 2 0 XLH (SUTURE) ×2 IMPLANT
SUT VIC AB 0 CT1 18XCR BRD8 (SUTURE) ×4 IMPLANT
SUT VIC AB 0 CT1 27 (SUTURE) ×3
SUT VIC AB 0 CT1 27XBRD ANBCTR (SUTURE) ×1 IMPLANT
SUT VIC AB 0 CT1 36 (SUTURE) ×6 IMPLANT
SUT VIC AB 0 CT1 8-18 (SUTURE) ×12
SUT VIC AB 2-0 CT1 27 (SUTURE) ×3
SUT VIC AB 2-0 CT1 TAPERPNT 27 (SUTURE) ×1 IMPLANT
SUT VIC AB 4-0 PS2 27 (SUTURE) IMPLANT
SUT VICRYL 0 TIES 12 18 (SUTURE) ×3 IMPLANT
SYR CONTROL 10ML LL (SYRINGE) ×3 IMPLANT
TOWEL GREEN STERILE FF (TOWEL DISPOSABLE) ×6 IMPLANT
TRAY FOLEY W/BAG SLVR 14FR (SET/KITS/TRAYS/PACK) ×3 IMPLANT

## 2019-07-15 NOTE — Op Note (Signed)
Donna [name] PROCEDURE DATE: 07/15/2019  PREOPERATIVE DIAGNOSES:  Symptomatic fibroids, abnormal uterine bleeding POSTOPERATIVE DIAGNOSES:  The same SURGEON:  [name], MD ASSISTANT:  Dr. Lavonia Drafts.  An experienced assistant was required given the standard of surgical care given the complexity of the case.  This assistant was needed for exposure, dissection, suctioning, retraction, instrument exchange, and for overall help during the procedure. OPERATION:  Total abdominal hysterectomy, Bilateral Salpingectomy  ANESTHESIA:  General endotracheal.  INDICATIONS: The patient is a 48 y.o. G1P1001 with the aforementioned diagnoses who desires definitive surgical management. On the preoperative visit, the risks, benefits, indications, and alternatives of the procedure were reviewed with the patient.  On the day of surgery, the risks of surgery were again discussed with the patient including but not limited to: bleeding which may require transfusion or reoperation; infection which may require antibiotics; injury to bowel, bladder, ureters or other surrounding organs; need for additional procedures; thromboembolic phenomenon, incisional problems and other postoperative/anesthesia complications. Written informed consent was obtained.    OPERATIVE FINDINGS: A 24 week size uterus with normal tubes and ovaries bilaterally.  ESTIMATED BLOOD LOSS: 400 ml FLUIDS:  1500 ml of Lactated Ringers URINE OUTPUT:  100 ml of clear yellow urine. SPECIMENS:  Uterus,cervix,  bilateral fallopian tubes sent to pathology COMPLICATIONS:  None immediate.   DESCRIPTION OF PROCEDURE:  The patient received intravenous antibiotics and had sequential compression devices applied to her lower extremities while in the preoperative area.   She was taken to the operating room and placed under general anesthesia without difficulty.The abdomen and perineum were prepped and draped in a sterile manner, and she was  placed in a dorsal supine position.  A Foley catheter was inserted into the bladder and attached to constant drainage. After an adequate timeout was performed, a Pfannensteil skin incision was made. This incision was taken down to the fascia using electrocautery with care given to maintain good hemostasis. The fascia was incised in the midline and the fascial incision was then extended bilaterally using electrocautery without difficulty. The fascia was then dissected off the underlying rectus muscles using blunt and sharp dissection. The rectus muscles were split bluntly in the midline and the peritoneum entered sharply without complication. This peritoneal incision was then extended superiorly and inferiorly with care given to prevent bowel or bladder injury. Attention was then turned to the pelvis. The uterus at this point was noted to be mobilized and was delivered up out of the abdomen.  The round ligaments on each side were clamped and cauterized and cut with the Ligasure devicet. Of note, all sutures used in this procedure are 0 Vicryl unless otherwise noted. The anterior and posterior leaves of the broad ligament were separated, and the ureters were inspected to be safely away from the area of dissection bilaterally.  Adnexae were clamped on the patient's right side, cut, and doubly suture ligated. This procedure was repeated in an identical fashion on the left site allowing for both adnexa to remain in place.  Kelly clamps were placed on the mesosalpinx of the right fallopian tube, and the fallopian tube was excised.  The pedicle was then secured with a free tie.  A similar process was carried out on the left side, allowing for bilateral salpingectomy.    .  A bladder flap was then created.  The bladder was then bluntly dissected off the lower uterine segment and cervix with good hemostasis noted. The uterine arteries were then skeletonized bilaterally and then clamped,  cut, and doubly suture ligated with  care given to prevent ureteral injury. The uterosacral ligaments were then  cut, and ligated bilaterally.  Finally, the cardinal ligaments were clamped, cut, and ligated bilaterally.  Acutely curved clamps were placed across the vagina just under the cervix, and the specimen was amputated and sent to pathology. The vaginal cuff angles were closed with Heaney stiches with care given to incorporate the uterosacral-cardinal ligament pedicles on both sides. The middle of the vaginal cuff was closed with a series of interrupted figure-of-eight sutures with care given to incorporate the anterior pubocervical fascia and the posterior rectovaginal fascia.   The pelvis was irrigated and hemostasis was reconfirmed at all pedicles and along the pelvic sidewall.  The ureters were inspected and noted to be peristalsing bilaterally.  All laparotomy sponges and instruments were removed from the abdomen. The peritoneum was closed with a running stitch, and the fascia was also closed in a running fashion. The subcutaneous layer was reapproximated with 2-0 plain gut. The skin was closed with a 4-0 Vicryl subcuticular stitch. Sponge, lap, needle, and instrument counts were correct times two. The patient was taken to the recovery area awake, extubated and in stable condition.   [name], MD [facility], [facility] for [facility], [place]

## 2019-07-15 NOTE — Anesthesia Procedure Notes (Signed)
Procedure Name: Intubation Performed by: Milford Cage, CRNA Pre-anesthesia Checklist: Patient identified, Emergency Drugs available, Suction available and Patient being monitored Patient Re-evaluated:Patient Re-evaluated prior to induction Oxygen Delivery Method: Circle System Utilized Preoxygenation: Pre-oxygenation with 100% oxygen Induction Type: IV induction Laryngoscope Size: Glidescope and 4 Grade View: Grade I Tube type: Oral Tube size: 7.5 mm Number of attempts: 1 Airway Equipment and Method: Stylet Placement Confirmation: ETT inserted through vocal cords under direct vision,  positive ETCO2 and breath sounds checked- equal and bilateral Secured at: 21 cm Tube secured with: Tape Dental Injury: Teeth and Oropharynx as per pre-operative assessment

## 2019-07-15 NOTE — H&P (Signed)
Donna [name] is an 48 y.o. female. G1P1001 Patient's last menstrual period was 07/05/2019 (exact date). She has one day of heavy flow with menses lasting 3 days, no cramps but lower abdominal discomfort, referred due to large fibroid uterus. CT 12/2018 and 2018 reviewed She is scheduled for TAH BS due to uterine mass. She held her coumadin for 7 days and had lovenox until yesterday Pertinent Gynecological History:  OB History: G1P1001    Menstrual History: Patient's last menstrual period was 07/05/2019 (exact date).    Past Medical History:  Diagnosis Date  . DVT (deep venous thrombosis) ([place]) 12/06/2012  . Lung collapse   . PONV (postoperative nausea and vomiting)   . Pulmonary embolism affecting pregnancy     Past Surgical History:  Procedure Laterality Date  . RIB RESECTION      Family History  Problem Relation Age of Onset  . COPD Father   . Stroke Father     Social History:  reports that she has never smoked. She has never used smokeless tobacco. She reports current alcohol use. She reports that she does not use drugs.  Allergies: No Known Allergies  Medications Prior to Admission  Medication Sig Dispense Refill Last Dose  . atorvastatin (LIPITOR) 20 MG tablet Take 1 tablet (20 mg total) by mouth daily. (Patient taking differently: Take 20 mg by mouth at bedtime. ) 30 tablet 6 Past Week at Unknown time  . enoxaparin (LOVENOX) 120 MG/0.8ML injection Inject 0.86 mLs (130 mg total) into the skin every 12 (twelve) hours. 24 mL 0 07/14/2019 at Unknown time  . warfarin (COUMADIN) 5 MG tablet TAKE 2 TABLETS BY MOUTH DAILY. (Patient taking differently: Take 5-10 mg by mouth See admin instructions. Take 5 mg on Monday and Friday All the other days take 10 mg) 60 tablet 2 Past Week at Unknown time  . cephALEXin (KEFLEX) 500 MG capsule Take 1 capsule (500 mg total) by mouth 2 (two) times daily. 10 capsule 0   . ferrous sulfate 325 (65 FE) MG tablet One tablet orally twice  daily with a meal (Patient taking differently: Take 325 mg by mouth 2 (two) times daily with a meal. Some times take 650 mg) 60 tablet 2 Unknown at Unknown time    Review of Systems  Constitutional: Negative.   Respiratory: Negative.   Cardiovascular: Negative.   Gastrointestinal: Positive for abdominal distention.  Genitourinary: Positive for menstrual problem.    Blood pressure 105/71, pulse 96, temperature 98.3 F (36.8 C), temperature source Oral, resp. rate 16, height 5\' 10"  (1.778 m), weight 129.2 kg, last menstrual period 07/05/2019, SpO2 100 %. Physical Exam  Vitals reviewed. Constitutional: She is oriented to person, place, and time. She appears well-developed. No distress.  Cardiovascular: Normal rate.  Respiratory: Effort normal.  GI: She exhibits mass.  Musculoskeletal:        General: Normal range of motion.  Neurological: She is alert and oriented to person, place, and time.  Skin: Skin is warm and dry.  Psychiatric: She has a normal mood and affect.    Results for orders placed or performed during the hospital encounter of 07/15/19 (from the past 24 hour(s))  Pregnancy, urine POC     Status: None   Collection Time: 07/15/19  1:36 PM  Result Value Ref Range   Preg Test, Ur NEGATIVE NEGATIVE  Protime-INR     Status: None   Collection Time: 07/15/19  1:46 PM  Result Value Ref Range   Prothrombin Time 12.7  11.4 - 15.2 seconds   INR 1.0 0.8 - 1.2  CLINICAL DATA:  Abdominal pain.  EXAM: CT ABDOMEN AND PELVIS WITH CONTRAST  TECHNIQUE: Multidetector CT imaging of the abdomen and pelvis was performed using the standard protocol following bolus administration of intravenous contrast.  CONTRAST:  152mL OMNIPAQUE IOHEXOL 300 MG/ML  SOLN  COMPARISON:  CT dated 02/19/2015.  FINDINGS: Lower chest: No acute abnormality.  Hepatobiliary: There is hepatomegaly with hepatic steatosis. The gallbladder is unremarkable.  Pancreas: Unremarkable. No pancreatic  ductal dilatation or surrounding inflammatory changes.  Spleen: Normal in size without focal abnormality.  Adrenals/Urinary Tract: Adrenal glands are unremarkable. There is no hydronephrosis. There is a punctate nonobstructing stone in lower pole of the left kidney. The bladder is unremarkable, however there is significant mass effect on the bladder from the patient's nearby uterus.  Stomach/Bowel: Stomach is within normal limits. Appendix appears normal. No evidence of bowel wall thickening, distention, or inflammatory changes.  Vascular/Lymphatic: No significant vascular findings are present. No enlarged abdominal or pelvic lymph nodes.  Reproductive: The uterus is significantly enlarged and heterogeneous currently measuring approximately 26 cm SI x 17 cm TV (previously measuring approximately 15 cm SI by 12 cm TV). There are new low attenuation areas in the fundus measuring approximately 12 and 7 cm each. The ovaries are not well evaluated.  Other: No abdominal wall hernia or abnormality. No abdominopelvic ascites.  Musculoskeletal: No acute or significant osseous findings.  IMPRESSION: 1. Punctate nonobstructing stone in the lower pole the left kidney. No evidence of hydronephrosis. 2. Significant interval increase in size of the patient's bulky heterogeneous uterus with new low-attenuation areas near the uterine fundus. Overall, the uterus currently measures 26 x 17 cm (previously measuring 15 x 12 cm on 02/19/2015). The heterogeneous appearance is favored to be secondary to underlying fibroids. Outpatient gynecology follow-up is recommended. An underlying mass cannot be excluded. 3. Significant mass effect is noted on the patient's urinary bladder from the enlarged uterus. 4. Hepatic steatosis.   Electronically Signed   By: Constance Holster M.D.   On: 12/23/2018 23:34    Assessment/Plan: Uterine fibroid, large H/o PE Patient desires surgical  management with TAH and BS.  The risks of surgery were discussed in detail with the patient including but not limited to: bleeding which may require transfusion or reoperation; infection which may require prolonged hospitalization or re-hospitalization and antibiotic therapy; injury to bowel, bladder, ureters and major vessels or other surrounding organs; need for additional procedures including laparotomy; thromboembolic phenomenon, incisional problems and other postoperative or anesthesia complications.  Patient was told that the likelihood that her condition and symptoms will be treated effectively with this surgical management was very high; the postoperative expectations were also discussed in detail. The patient also understands the alternative treatment options which were discussed in full. All questions were answered. Lovenox post op in AM   Emeterio Reeve 07/15/2019, 4:22 PM

## 2019-07-15 NOTE — Anesthesia Postprocedure Evaluation (Signed)
Anesthesia Post Note  Patient: Donna [name]  Procedure(s) Performed: HYSTERECTOMY ABDOMINAL WITH SALPINGECTOMY (Bilateral Abdomen)     Patient location during evaluation: PACU Anesthesia Type: General Level of consciousness: awake and alert Pain management: pain level controlled Vital Signs Assessment: post-procedure vital signs reviewed and stable Respiratory status: spontaneous breathing, nonlabored ventilation and respiratory function stable Cardiovascular status: blood pressure returned to baseline and stable Postop Assessment: no apparent nausea or vomiting Anesthetic complications: no    Last Vitals:  Vitals:   07/15/19 2000 07/15/19 2035  BP:  114/74  Pulse:  76  Resp:  17  Temp: 37 C 36.6 C  SpO2:  100%    Last Pain:  Vitals:   07/15/19 2035  TempSrc: Oral  PainSc:                  Audry Pili

## 2019-07-15 NOTE — Progress Notes (Signed)
Arrived to 6n20 from PACU at this time.

## 2019-07-15 NOTE — Transfer of Care (Signed)
Immediate Anesthesia Transfer of Care Note  Patient: Donna [name]  Procedure(s) Performed: HYSTERECTOMY ABDOMINAL WITH SALPINGECTOMY (Bilateral Abdomen)  Patient Location: PACU  Anesthesia Type:General  Level of Consciousness: awake  Airway & Oxygen Therapy: Patient Spontanous Breathing and Patient connected to face mask oxygen  Post-op Assessment: Report given to RN and Post -op Vital signs reviewed and stable  Post vital signs: Reviewed and stable  Last Vitals:  Vitals Value Taken Time  BP    Temp    Pulse 84 07/15/19 1902  Resp 14 07/15/19 1902  SpO2 99 % 07/15/19 1902  Vitals shown include unvalidated device data.  Last Pain:  Vitals:   07/15/19 1338  TempSrc: Oral  PainSc: 0-No pain         Complications: No apparent anesthesia complications

## 2019-07-16 MED ORDER — WARFARIN SODIUM 5 MG PO TABS
10.0000 mg | ORAL_TABLET | Freq: Every day | ORAL | Status: DC
  Administered 2019-07-16: 10 mg via ORAL
  Filled 2019-07-16: qty 2

## 2019-07-16 MED ORDER — ENOXAPARIN SODIUM 80 MG/0.8ML ~~LOC~~ SOLN
65.0000 mg | SUBCUTANEOUS | Status: DC
  Administered 2019-07-17: 65 mg via SUBCUTANEOUS
  Filled 2019-07-16: qty 0.8

## 2019-07-16 MED ORDER — WARFARIN - PHYSICIAN DOSING INPATIENT: Freq: Every day | Status: DC

## 2019-07-16 MED ORDER — CHLORHEXIDINE GLUCONATE CLOTH 2 % EX PADS
6.0000 | MEDICATED_PAD | Freq: Every day | CUTANEOUS | Status: DC
  Administered 2019-07-16 – 2019-07-17 (×2): 6 via TOPICAL

## 2019-07-16 NOTE — Progress Notes (Signed)
1 Day Post-Op Procedure(s) (LRB): HYSTERECTOMY ABDOMINAL WITH SALPINGECTOMY (Bilateral)  Subjective: Patient reports incisional pain and tolerating PO.    Objective: I have reviewed patient's vital signs. Blood pressure 117/73, pulse 79, temperature 98.3 F (36.8 C), temperature source Oral, resp. rate 18, height 5\' 10"  (1.778 m), weight 130.2 kg, last menstrual period 07/05/2019, SpO2 97 %.  General: alert, cooperative and no distress Resp: nl effort GI: soft, non-tender; bowel sounds normal; no masses,  no organomegaly Extremities: extremities normal, atraumatic, no cyanosis or edema Incision intact  CBC    Component Value Date/Time   WBC 17.0 (H) 07/15/2019 1918   RBC 4.05 07/15/2019 1918   HGB 9.8 (L) 07/15/2019 1918   HGB 12.3 07/11/2017 1209   HCT 32.8 (L) 07/15/2019 1918   HCT 38.5 07/11/2017 1209   PLT 387 07/15/2019 1918   PLT 249 07/11/2017 1209   MCV 81.0 07/15/2019 1918   MCV 84.2 07/11/2017 1209   MCH 24.2 (L) 07/15/2019 1918   MCHC 29.9 (L) 07/15/2019 1918   RDW 18.9 (H) 07/15/2019 1918   RDW 16.7 (H) 07/11/2017 1209   LYMPHSABS 2.2 04/07/2019 1511   LYMPHSABS 2.0 07/11/2017 1209   MONOABS 0.5 04/07/2019 1511   MONOABS 0.4 07/11/2017 1209   EOSABS 0.1 04/07/2019 1511   EOSABS 0.1 07/11/2017 1209   BASOSABS 0.0 04/07/2019 1511   BASOSABS 0.0 07/11/2017 1209    Assessment: s/p Procedure(s): HYSTERECTOMY ABDOMINAL WITH SALPINGECTOMY (Bilateral): stable  Plan: Advance diet Encourage ambulation  LOS: 1 day    Emeterio Reeve 07/16/2019, 9:38 AM

## 2019-07-17 ENCOUNTER — Telehealth: Payer: Self-pay | Admitting: *Deleted

## 2019-07-17 ENCOUNTER — Other Ambulatory Visit: Payer: Self-pay | Admitting: Advanced Practice Midwife

## 2019-07-17 ENCOUNTER — Other Ambulatory Visit: Payer: Self-pay | Admitting: Obstetrics & Gynecology

## 2019-07-17 DIAGNOSIS — D259 Leiomyoma of uterus, unspecified: Secondary | ICD-10-CM

## 2019-07-17 LAB — SURGICAL PATHOLOGY

## 2019-07-17 MED ORDER — OXYCODONE HCL 5 MG PO TABS: 5.0000 mg | ORAL_TABLET | ORAL | 0 refills | Status: DC | PRN

## 2019-07-17 MED ORDER — OXYCODONE-ACETAMINOPHEN 5-325 MG PO TABS: 1.0000 | ORAL_TABLET | Freq: Four times a day (QID) | ORAL | 0 refills | Status: DC | PRN

## 2019-07-17 NOTE — Telephone Encounter (Signed)
Shiori left a message on voicemail this am stating she just had surgery and Dr.Arnold was supposed to send in RX and she wanted to know which pharmacy or is she supposed to take " this paper " in.  Per chart review Clairese is still in patient. And had a prescription for oxycodone printed.  I called [facility] and explained I am returning her call about her rx. I verified with her she is still at the hospital and is being discharged today. I explained since she was given the printed prescription she will need to take that prescription to the pharmacy. She says the pharmacy told her to have it faxed. I instructed her to talk with her nurse there at the hospital and explained because this is a narcotic and she was given the printed prescription - she will need to talk with her  Nurse there because if they fax it in or call it in; they will need to take her printed prescription back- because we have to be very careful with narcotics. She voices understanding.  Shanley Furlough,RN

## 2019-07-17 NOTE — Discharge Summary (Signed)
Physician Discharge Summary  Patient ID: Donna [name] MRN: [id number] DOB/AGE: 04-09-1971 48 y.o.  Admit date: 07/15/2019 Discharge date: 07/17/2019  Admission Diagnoses:fibroid uterus  Discharge Diagnoses:  Active Problems:   Fibroid uterus   Discharged Condition: good  Hospital Course: Donna [name] is an 48 y.o. female. G1P1001  Patient's last menstrual period was 07/05/2019 (exact date).  She has one day of heavy flow with menses lasting 3 days, no cramps but lower abdominal discomfort, referred due to large fibroid uterus. CT 12/2018 and 2018 reviewed  She is scheduled for TAH BS due to uterine mass. She held her coumadin for 7 days and had lovenox until yesterday  Pertinent Gynecological History:  OB History: G1P1001  Menstrual History:  Patient's last menstrual period was 07/05/2019 (exact date).       Past Medical History:  Diagnosis Date  . DVT (deep venous thrombosis) ([place]) 12/06/2012  . Lung collapse   . PONV (postoperative nausea and vomiting)   . Pulmonary embolism affecting pregnancy         Past Surgical History:  Procedure Laterality Date  . RIB RESECTION          Family History  Problem Relation Age of Onset  . COPD Father   . Stroke Father    Social History: reports that she has never smoked. She has never used smokeless tobacco. She reports current alcohol use. She reports that she does not use drugs.  Allergies: No Known Allergies         Medications Prior to Admission  Medication Sig Dispense Refill Last Dose  . atorvastatin (LIPITOR) 20 MG tablet Take 1 tablet (20 mg total) by mouth daily. (Patient taking differently: Take 20 mg by mouth at bedtime. ) 30 tablet 6 Past Week at Unknown time  . enoxaparin (LOVENOX) 120 MG/0.8ML injection Inject 0.86 mLs (130 mg total) into the skin every 12 (twelve) hours. 24 mL 0 07/14/2019 at Unknown time  . warfarin (COUMADIN) 5 MG tablet TAKE 2 TABLETS BY MOUTH DAILY. (Patient taking differently: Take  5-10 mg by mouth See admin instructions. Take 5 mg on Monday and Friday  All the other days take 10 mg) 60 tablet 2 Past Week at Unknown time  . cephALEXin (KEFLEX) 500 MG capsule Take 1 capsule (500 mg total) by mouth 2 (two) times daily. 10 capsule 0   . ferrous sulfate 325 (65 FE) MG tablet One tablet orally twice daily with a meal (Patient taking differently: Take 325 mg by mouth 2 (two) times daily with a meal. Some times take 650 mg) 60 tablet 2 Unknown at Unknown time   Review of Systems  Constitutional: Negative.  Respiratory: Negative.  Cardiovascular: Negative.  Gastrointestinal: Positive for abdominal distention.  Genitourinary: Positive for menstrual problem.   Blood pressure 105/71, pulse 96, temperature 98.3 F (36.8 C), temperature source Oral, resp. rate 16, height 5\' 10"  (1.778 m), weight 129.2 kg, last menstrual period 07/05/2019, SpO2 100 %.  Physical Exam  Vitals reviewed.  Constitutional: She is oriented to person, place, and time. She appears well-developed. No distress.  Cardiovascular: Normal rate.  Respiratory: Effort normal.  GI: She exhibits mass.  Musculoskeletal:  General: Normal range of motion.  Neurological: She is alert and oriented to person, place, and time.  Skin: Skin is warm and dry.  Psychiatric: She has a normal mood and affect.   Lab Results Last 24 Hours  CLINICAL DATA: Abdominal pain.  EXAM:  CT ABDOMEN AND PELVIS WITH CONTRAST  TECHNIQUE:  Multidetector CT imaging of the abdomen and pelvis was performed  using the standard protocol following bolus administration of  intravenous contrast.  CONTRAST: 133mL OMNIPAQUE IOHEXOL 300 MG/ML SOLN  COMPARISON: CT dated 02/19/2015.  FINDINGS:  Lower chest: No acute abnormality.  Hepatobiliary: There is hepatomegaly with hepatic steatosis. The  gallbladder is unremarkable.  Pancreas: Unremarkable. No pancreatic ductal dilatation or   surrounding inflammatory changes.  Spleen: Normal in size without focal abnormality.  Adrenals/Urinary Tract: Adrenal glands are unremarkable. There is no  hydronephrosis. There is a punctate nonobstructing stone in lower  pole of the left kidney. The bladder is unremarkable, however there  is significant mass effect on the bladder from the patient's nearby  uterus.  Stomach/Bowel: Stomach is within normal limits. Appendix appears  normal. No evidence of bowel wall thickening, distention, or  inflammatory changes.  Vascular/Lymphatic: No significant vascular findings are present. No  enlarged abdominal or pelvic lymph nodes.  Reproductive: The uterus is significantly enlarged and heterogeneous  currently measuring approximately 26 cm SI x 17 cm TV (previously  measuring approximately 15 cm SI by 12 cm TV). There are new low  attenuation areas in the fundus measuring approximately 12 and 7 cm  each. The ovaries are not well evaluated.  Other: No abdominal wall hernia or abnormality. No abdominopelvic  ascites.  Musculoskeletal: No acute or significant osseous findings.  IMPRESSION:  1. Punctate nonobstructing stone in the lower pole the left kidney.  No evidence of hydronephrosis.  2. Significant interval increase in size of the patient's bulky  heterogeneous uterus with new low-attenuation areas near the uterine  fundus. Overall, the uterus currently measures 26 x 17 cm  (previously measuring 15 x 12 cm on 02/19/2015). The heterogeneous  appearance is favored to be secondary to underlying fibroids.  Outpatient gynecology follow-up is recommended. An underlying mass  cannot be excluded.  3. Significant mass effect is noted on the patient's urinary bladder  from the enlarged uterus.  4. Hepatic steatosis.  Electronically Signed  By: Constance Holster M.D.  On: 12/23/2018 23:34  Assessment/Plan:  Uterine fibroid, large  H/o PE  Patient desires surgical management with TAH and  BS. The risks of surgery were discussed in detail with the patient including but not limited to: bleeding which may require transfusion or reoperation; infection which may require prolonged hospitalization or re-hospitalization and antibiotic therapy; injury to bowel, bladder, ureters and major vessels or other surrounding organs; need for additional procedures including laparotomy; thromboembolic phenomenon, incisional problems and other postoperative or anesthesia complications. Patient was told that the likelihood that her condition and symptoms will be treated effectively with this surgical management was very high; the postoperative expectations were also discussed in detail. The patient also understands the alternative treatment options which were discussed in full. All questions were answered. Lovenox post op in AM  Donna [name]  07/15/2019, 4:22 PM  Donna [name] PROCEDURE DATE: 07/15/2019  PREOPERATIVE DIAGNOSES:  Symptomatic fibroids, abnormal uterine bleeding POSTOPERATIVE DIAGNOSES:  The same SURGEON:  [name], MD ASSISTANT:  Dr. Lavonia Drafts.  An experienced assistant was required given the standard of surgical care given the complexity of the case.  This assistant was needed for exposure, dissection, suctioning, retraction, instrument exchange, and for overall help during the procedure. OPERATION:  Total abdominal hysterectomy, Bilateral Salpingectomy  ANESTHESIA:  General endotracheal. Consults: None  Significant Diagnostic Studies: labs:  CBC  Component Value Date/Time   WBC 17.0 (H) 07/15/2019 1918   RBC 4.05 07/15/2019 1918   HGB 9.8 (L) 07/15/2019 1918   HGB 12.3 07/11/2017 1209   HCT 32.8 (L) 07/15/2019 1918   HCT 38.5 07/11/2017 1209   PLT 387 07/15/2019 1918   PLT 249 07/11/2017 1209   MCV 81.0 07/15/2019 1918   MCV 84.2 07/11/2017 1209   MCH 24.2 (L) 07/15/2019 1918   MCHC 29.9 (L) 07/15/2019 1918   RDW 18.9 (H) 07/15/2019 1918   RDW  16.7 (H) 07/11/2017 1209   LYMPHSABS 2.2 04/07/2019 1511   LYMPHSABS 2.0 07/11/2017 1209   MONOABS 0.5 04/07/2019 1511   MONOABS 0.4 07/11/2017 1209   EOSABS 0.1 04/07/2019 1511   EOSABS 0.1 07/11/2017 1209   BASOSABS 0.0 04/07/2019 1511   BASOSABS 0.0 07/11/2017 1209     Treatments: surgery: TAH BS  Discharge Exam: Blood pressure 124/74, pulse 98, temperature 98.3 F (36.8 C), temperature source Oral, resp. rate 20, height 5\' 10"  (1.778 m), weight 130.2 kg, last menstrual period 07/05/2019, SpO2 97 %. General appearance: alert, cooperative and no distress Resp: effort normal GI: soft, non-tender; bowel sounds normal; no masses,  no organomegaly Incision/Wound:dry and intact  Disposition: Discharge disposition: 01-Home or Self Care       Discharge Instructions    Discharge patient   Complete by: As directed    Discharge disposition: 01-Home or Self Care   Discharge patient date: 07/17/2019     Allergies as of 07/17/2019   No Known Allergies     Medication List    TAKE these medications   atorvastatin 20 MG tablet Commonly known as: LIPITOR Take 1 tablet (20 mg total) by mouth daily. What changed: when to take this   cephALEXin 500 MG capsule Commonly known as: KEFLEX Take 1 capsule (500 mg total) by mouth 2 (two) times daily.   enoxaparin 120 MG/0.8ML injection Commonly known as: Lovenox Inject 0.86 mLs (130 mg total) into the skin every 12 (twelve) hours.   ferrous sulfate 325 (65 FE) MG tablet One tablet orally twice daily with a meal What changed:   how much to take  how to take this  when to take this  additional instructions   oxyCODONE 5 MG immediate release tablet Commonly known as: Oxy IR/ROXICODONE Take 1-2 tablets (5-10 mg total) by mouth every 4 (four) hours as needed for moderate pain.   warfarin 5 MG tablet Commonly known as: COUMADIN Take as directed. If you are unsure how to take this medication, talk to your nurse or  doctor. Original instructions: TAKE 2 TABLETS BY MOUTH DAILY. What changed:   how much to take  when to take this  additional instructions      [place] [facility] Follow up in 4 week(s).   Specialty: Obstetrics and Gynecology Contact information: [address], [place] [id number] [place] [phone]          Signed: Emeterio [name] 07/17/2019, 9:38 AM

## 2019-07-17 NOTE — Progress Notes (Signed)
Patient discharged to home with instructions and prescription. 

## 2019-07-17 NOTE — Discharge Instructions (Signed)
Abdominal Hysterectomy, Care After This sheet gives you information about how to care for yourself after your procedure. Your doctor may also give you more specific instructions. If you have problems or questions, contact your doctor. Follow these instructions at home: Bathing  Do not take baths, swim, or use a hot tub until your doctor says it is okay. Ask your doctor if you can take showers. You may only be allowed to take sponge baths for bathing.  Keep the bandage (dressing) dry until your doctor says it can be taken off. Surgical cut (incision) care      Follow instructions from your doctor about how to take care of your cut from surgery. Make sure you: ? Wash your hands with soap and water before you change your bandage (dressing). If you cannot use soap and water, use hand sanitizer. ? Change your bandage as told by your doctor. ? Leave stitches (sutures), skin glue, or skin tape (adhesive) strips in place. They may need to stay in place for 2 weeks or longer. If tape strips get loose and curl up, you may trim the loose edges. Do not remove tape strips completely unless your doctor says it is okay.  Check your surgical cut area every day for signs of infection. Check for: ? Redness, swelling, or pain. ? Fluid or blood. ? Warmth. ? Pus or a bad smell. Activity  Do gentle, daily exercise as told by your doctor. You may be told to take short walks every day and go farther each time.  Do not lift anything that is heavier than 10 lb (4.5 kg), or the limit that your doctor tells you, until he or she says that it is safe.  Do not drive or use heavy machinery while taking prescription pain medicine.  Do not drive for 24 hours if you were given a medicine to help you relax (sedative).  Follow your doctor's advice about exercise, driving, and general activities. Ask your doctor what activities are safe for you. Lifestyle   Do not douche, use tampons, or have sex for at least 6 weeks  or as told by your doctor.  Do not drink alcohol until your doctor says it is okay.  Drink enough fluid to keep your pee (urine) clear or pale yellow.  Try to have someone at home with you for the first 1-2 weeks to help.  Do not use any products that contain nicotine or tobacco, such as cigarettes and e-cigarettes. These can slow down healing. If you need help quitting, ask your doctor. General instructions  Take over-the-counter and prescription medicines only as told by your doctor.  Do not take aspirin or ibuprofen. These medicines can cause bleeding.  To prevent or treat constipation while you are taking prescription pain medicine, your doctor may suggest that you: ? Drink enough fluid to keep your urine clear or pale yellow. ? Take over-the-counter or prescription medicines. ? Eat foods that are high in fiber, such as:  Fresh fruits and vegetables.  Whole grains.  Beans. ? Limit foods that are high in fat and processed sugars, such as fried and sweet foods.  Keep all follow-up visits as told by your doctor. This is important. Contact a doctor if:  You have chills or fever.  You have redness, swelling, or pain around your cut.  You have fluid or blood coming from your cut.  Your cut feels warm to the touch.  You have pus or a bad smell coming from your cut.    Your cut breaks open.  You feel dizzy or light-headed.  You have pain or bleeding when you pee.  You keep having watery poop (diarrhea).  You keep feeling sick to your stomach (nauseous) or keep throwing up (vomiting).  You have unusual fluid (discharge) coming from your vagina.  You have a rash.  You have a reaction to your medicine.  Your pain medicine does not help. Get help right away if:  You have a fever and your symptoms get worse all of a sudden.  You have very bad belly (abdominal) pain.  You are short of breath.  You pass out (faint).  You have pain, swelling, or redness of your  leg.  You bleed a lot from your vagina and notice clumps of blood (clots). Summary  Do not take baths, swim, or use a hot tub until your doctor says it is okay. Ask your doctor if you can take showers. You may only be allowed to take sponge baths for bathing.  Follow your doctor's advice about exercise, driving, and general activities. Ask your doctor what activities are safe for you.  Do not lift anything that is heavier than 10 lb (4.5 kg), or the limit that your doctor tells you, until he or she says that it is safe.  Try to have someone at home with you for the first 1-2 weeks to help. This information is not intended to replace advice given to you by your health care provider. Make sure you discuss any questions you have with your health care provider. Document Revised: 09/05/2018 Document Reviewed: 06/21/2016 Elsevier Patient Education  2020 [employer]  

## 2019-07-21 ENCOUNTER — Telehealth: Payer: Self-pay | Admitting: Lactation Services

## 2019-07-21 ENCOUNTER — Telehealth: Payer: Self-pay | Admitting: *Deleted

## 2019-07-21 NOTE — Telephone Encounter (Signed)
Pt left message on nurse like that Gibson had questions about prescription for Oxycodone that was sent on 12/31. Pt was told that they could not refill as amount was too large.   [facility]	[place] and spoke with Freda Munro. Pharmacist reports quantity is fine, he reports that the Morphine amount is too high in a day. Spoke with Rachel Bo who recommended taking 1-2 every 8 hours or 1 every 6 hours for pain. Dr. Roselie Awkward approved taking 1 tablet every 6 hours at needed for pain. Verbal order given.   Pt was called and notified of change in prescription. Pt voiced understanding.

## 2019-07-21 NOTE — Telephone Encounter (Signed)
Donna [name] left a voice message 07/17/19 at 1:09 pm ( office closed at 12 )  That she would like a call back; states trying to get prescription so she can get discharged from the hospital. States the prescription has to be electronic sent not faxed or paper.  From chart review RX for percocet was eprescribed 07/17/19 . I called [facility] and left a message I was returning her call and I see that RX was sent in; please let us know if you have any issues with the rx.  General Wearing,RN

## 2019-07-23 ENCOUNTER — Telehealth: Payer: Self-pay

## 2019-07-23 ENCOUNTER — Ambulatory Visit: Payer: Medicaid Other | Admitting: Pharmacist

## 2019-07-23 NOTE — Telephone Encounter (Addendum)
Pt stated that she had surgery and is now having trouble eating.  Is this normal?  Pt called and she informed me that every time she eats she feels that it is just sitting in her chest.  I confirmed what type of foods she was eating.  Pt stated that she was eating spaghetti and eggs to name a couple.  I advised pt that she should eat bland foods then work her way up as she tolerates them.  I advised pt that she can take Tums or simethicone to see if that helps with the gas.  Pt denies any issues with BM.  And if that does not help please give the office a call.  Pt verbalized understanding with no further questions.   Mel Almond, RN

## 2019-07-28 MED FILL — ATORVASTATIN CALCIUM 20 MG: 20 | 30 days supply | Qty: 30 | Fill #3

## 2019-07-28 MED FILL — WARFARIN SODIUM 5 MG TABLET: 5 | 30 days supply | Qty: 60 | Fill #2

## 2019-07-30 ENCOUNTER — Ambulatory Visit: Payer: Self-pay | Attending: Family Medicine | Admitting: Pharmacist

## 2019-07-30 ENCOUNTER — Other Ambulatory Visit: Payer: Self-pay

## 2019-07-30 DIAGNOSIS — Z7901 Long term (current) use of anticoagulants: Secondary | ICD-10-CM

## 2019-07-30 LAB — POCT INR: INR: 2.1 (ref 2.0–3.0)

## 2019-08-07 ENCOUNTER — Other Ambulatory Visit: Payer: Self-pay

## 2019-08-19 ENCOUNTER — Other Ambulatory Visit: Payer: Self-pay

## 2019-08-19 ENCOUNTER — Ambulatory Visit (HOSPITAL_COMMUNITY)
Admission: EM | Admit: 2019-08-19 | Discharge: 2019-08-19 | Disposition: A | Discharge status: Home / Self Care | Payer: Self-pay | Attending: Family Medicine | Admitting: Family Medicine

## 2019-08-19 ENCOUNTER — Encounter (HOSPITAL_COMMUNITY): Payer: Self-pay

## 2019-08-19 DIAGNOSIS — M79671 Pain in right foot: Secondary | ICD-10-CM

## 2019-08-19 MED ORDER — GABAPENTIN 300 MG PO CAPS: 300.0000 mg | ORAL_CAPSULE | Freq: Every day | ORAL | 0 refills | Status: DC

## 2019-08-19 NOTE — ED Triage Notes (Signed)
Pt c/o recurrent right foot pain since early December. Was seen here for same and was given Abx for puncture wound to the ball of her foot.  Pt recently had full hysterectomy and could not follow up for foot pain until now.  Pt states foot sometime feels numb/sharp pain.

## 2019-08-19 NOTE — Discharge Instructions (Addendum)
Take the gabapentin at night This is for nerve pain Call for an appointment with the foot doctor

## 2019-08-19 NOTE — ED Provider Notes (Signed)
[facility]    CSN: [id number] Arrival date & time: 08/19/19  1418      History   Chief Complaint Chief Complaint  Patient presents with  . Foot Pain    [name] is a 49 y.o. female.   HPI  I saw patient for foot pain a couple of months ago.  She had stepped on a piece of metal and an airport.  An x-ray confirmed there was no foreign body present.  At that she may have some soft tissue infection.  I gave her a prescription for Keflex.  Told to return as needed. She is here today telling me that her foot continues painful.  She states that it feels numb.  She states that it feels a burning pain.  She states that at times she has a sharp pain when she walks on the foot.  The foot appears normal.  She does not have any symptoms in the other foot.  She thinks this must be related to her injury. In the meantime patient did have a hysterectomy for fibroids.  She states it went well and her pain is improving. She continues on Coumadin for clotting disorder.  She is compliant with her medication and testing.  Denies bleeding problems  Past Medical History:  Diagnosis Date  . DVT (deep venous thrombosis) ([place]) 12/06/2012  . Lung collapse   . PONV (postoperative nausea and vomiting)   . Pulmonary embolism affecting pregnancy     Patient Active Problem List   Diagnosis Date Noted  . Fibroid uterus 01/20/2019  . Hyperlipidemia 10/17/2017  . Hypercoagulable state ([place]) 09/11/2017  . Personal history of venous thrombosis and embolism 08/01/2015  . Venous thrombosis 08/12/2014  . 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 Iron deficiency anemia 07/16/2014  . Pleuritic chest pain 07/16/2014  . High risk medication use 02/12/2013  . Chronic anticoagulation 02/12/2013  . Healthcare maintenance 02/12/2013  . Obesity 12/30/2012  . Menorrhagia 12/16/2012  . Pulmonary embolism ([place]) 12/05/2012  . Anemia 12/05/2012    Past Surgical History:  Procedure  Laterality Date  . HYSTERECTOMY ABDOMINAL WITH SALPINGECTOMY Bilateral 07/15/2019   Procedure: HYSTERECTOMY ABDOMINAL WITH SALPINGECTOMY;  Surgeon: [name], MD;  Location: [facility];  Service: Gynecology;  Laterality: Bilateral;  . RIB RESECTION      OB History    Gravida  1   Para  1   Term  1   Preterm  0   AB  0   Living  1     SAB  0   TAB  0   Ectopic  0   Multiple  0   Live Births               Home Medications    Prior to Admission medications   Medication Sig Start Date End Date Taking? Authorizing Provider  atorvastatin (LIPITOR) 20 MG tablet Take 1 tablet (20 mg total) by mouth daily. Patient taking differently: Take 20 mg by mouth at bedtime.  03/18/19  Yes Charlott Rakes, MD  ferrous sulfate 325 (65 FE) MG tablet One tablet orally twice daily with a meal Patient taking differently: Take 325 mg by mouth 2 (two) times daily with a meal. Some times take 650 mg 01/12/16  Yes Truitt Merle, MD  warfarin (COUMADIN) 5 MG tablet TAKE 2 TABLETS BY MOUTH DAILY. Patient taking differently: Take 5-10 mg by mouth See admin instructions. Take 5 mg on Monday and Friday All  the other days take 10 mg 05/15/19  Yes Newlin, Charlane Ferretti, MD  gabapentin (NEURONTIN) 300 MG capsule Take 1 capsule (300 mg total) by mouth at bedtime. 08/19/19   Raylene Everts, MD  oxyCODONE-acetaminophen (PERCOCET/ROXICET) 5-325 MG tablet Take 1-2 tablets by mouth every 6 (six) hours as needed for severe pain. 07/17/19   Marcille Buffy D, CNM  enoxaparin (LOVENOX) 120 MG/0.8ML injection Inject 0.86 mLs (130 mg total) into the skin every 12 (twelve) hours. 07/09/19 08/19/19  Charlott Rakes, MD    Family History Family History  Problem Relation Age of Onset  . COPD Father   . Stroke Father     Social History Social History   Tobacco Use  . Smoking status: Never Smoker  . Smokeless tobacco: Never Used  Substance Use Topics  . Alcohol use: Yes    Comment: socially   . Drug use: No      Allergies   Nsaids   Review of Systems Review of Systems  Musculoskeletal: Positive for gait problem.  Neurological: Positive for numbness.     Physical Exam Triage Vital Signs ED Triage Vitals  Enc Vitals Group     BP 08/19/19 1545 110/73     Pulse Rate 08/19/19 1545 (!) 112     Resp 08/19/19 1545 16     Temp 08/19/19 1545 98.1 F (36.7 C)     Temp Source 08/19/19 1545 Oral     SpO2 08/19/19 1545 99 %     Weight --      Height --      Head Circumference --      Peak Flow --      Pain Score 08/19/19 1547 7     Pain Loc --      Pain Edu? --      Excl. in [place]? --    No data found.  Updated Vital Signs BP 110/73 (BP Location: Left Arm)   Pulse (!) 112   Temp 98.1 F (36.7 C) (Oral)   Resp 16   LMP 07/05/2019 (Exact Date)   SpO2 99%     Physical Exam Constitutional:      General: She is not in acute distress.    Appearance: She is well-developed. She is obese.  HENT:     Head: Normocephalic and atraumatic.     Mouth/Throat:     Comments: Mask in place Eyes:     Conjunctiva/sclera: Conjunctivae normal.     Pupils: Pupils are equal, round, and reactive to light.  Cardiovascular:     Rate and Rhythm: Normal rate.  Pulmonary:     Effort: Pulmonary effort is normal. No respiratory distress.  Abdominal:     General: There is no distension.     Palpations: Abdomen is soft.  Musculoskeletal:        General: Normal range of motion.     Cervical back: Normal range of motion.     Right foot: Normal range of motion. No deformity.  Feet:     Right foot:     Protective Sensation: 6 sites tested. 6 sites sensed.     Skin integrity: Callus and dry skin present.     Toenail Condition: Right toenails are abnormally thick and long.  Skin:    General: Skin is warm and dry.  Neurological:     General: No focal deficit present.     Mental Status: She is alert.     Sensory: No sensory deficit.  Psychiatric:  Mood and Affect: Mood normal.        Behavior:  Behavior normal.      UC Treatments / Results  Labs (all labs ordered are listed, but only abnormal results are displayed) Labs Reviewed - No data to display  EKG   Radiology No results found.  Procedures Procedures (including critical care time)  Medications Ordered in UC Medications - No data to display  Initial Impression / Assessment and Plan / UC Course  I have reviewed the triage vital signs and the nursing notes.  Pertinent labs & imaging results that were available during my care of the patient were reviewed by me and considered in my medical decision making (see chart for details).     I am unable to determine the patient's source of perceived numbness.  I would refer her to podiatry.  Since she is unable to take NSAID drugs I will give her some gabapentin to take at bedtime for pain. Final Clinical Impressions(s) / UC Diagnoses   Final diagnoses:  Foot pain, right     Discharge Instructions     Take the gabapentin at night This is for nerve pain Call for an appointment with the foot doctor   ED Prescriptions    Medication Sig Dispense Auth. Provider   gabapentin (NEURONTIN) 300 MG capsule Take 1 capsule (300 mg total) by mouth at bedtime. 30 capsule Raylene Everts, MD     PDMP not reviewed this encounter.   Raylene Everts, MD 08/19/19 Karren Cobble

## 2019-08-20 MED FILL — GABAPENTIN 300 MG CAPSULE: 300 | 30 days supply | Qty: 30 | Fill #0

## 2019-08-27 ENCOUNTER — Other Ambulatory Visit: Payer: Self-pay

## 2019-08-27 ENCOUNTER — Ambulatory Visit: Payer: Self-pay | Attending: Family Medicine | Admitting: Pharmacist

## 2019-08-27 DIAGNOSIS — Z7901 Long term (current) use of anticoagulants: Secondary | ICD-10-CM

## 2019-08-27 LAB — POCT INR: INR: 1.8 — AB (ref 2.0–3.0)

## 2019-08-27 MED FILL — ?ATORVASTATIN 20 MG TABLET: 20 | 30 days supply | Qty: 30 | Fill #4

## 2019-08-29 ENCOUNTER — Telehealth: Payer: Self-pay | Admitting: Hematology

## 2019-08-29 ENCOUNTER — Other Ambulatory Visit: Payer: Self-pay

## 2019-08-29 ENCOUNTER — Encounter: Payer: Self-pay | Admitting: Family Medicine

## 2019-08-29 ENCOUNTER — Ambulatory Visit (INDEPENDENT_AMBULATORY_CARE_PROVIDER_SITE_OTHER): Payer: Self-pay | Admitting: Family Medicine

## 2019-08-29 VITALS — BP 115/68 | HR 89 | Wt 272.1 lb

## 2019-08-29 DIAGNOSIS — Z09 Encounter for follow-up examination after completed treatment for conditions other than malignant neoplasm: Secondary | ICD-10-CM

## 2019-08-29 DIAGNOSIS — Z23 Encounter for immunization: Secondary | ICD-10-CM

## 2019-08-29 NOTE — Progress Notes (Signed)
   Subjective:    Patient ID: Donna [name] is a 49 y.o. female presenting with Post-op Follow-up  on 08/29/2019  HPI: Here today for post op check. S/p TAH on 12/29. Now 6 wks out. Minimal pain. Normal bowel function.  Review of Systems  Constitutional: Negative for chills and fever.  Respiratory: Negative for shortness of breath.   Cardiovascular: Negative for chest pain.  Gastrointestinal: Negative for abdominal pain, nausea and vomiting.  Genitourinary: Negative for dysuria.  Skin: Negative for rash.      Objective:    BP 115/68   Pulse 89   Wt 272 lb 1.6 oz (123.4 kg)   LMP 07/05/2019 (Exact Date)   BMI 39.04 kg/m  Physical Exam Constitutional:      General: She is not in acute distress.    Appearance: She is well-developed.  HENT:     Head: Normocephalic and atraumatic.  Eyes:     General: No scleral icterus. Cardiovascular:     Rate and Rhythm: Normal rate.  Pulmonary:     Effort: Pulmonary effort is normal.  Abdominal:     Palpations: Abdomen is soft.  Musculoskeletal:     Cervical back: Neck supple.  Skin:    General: Skin is warm and dry.  Neurological:     Mental Status: She is alert and oriented to person, place, and time.         Assessment & Plan:  Need for influenza vaccination - Plan: Flu Vaccine QUAD 36+ mos IM  Postop check - Doing well. May resume normal activities.   Return if symptoms worsen or fail to improve.  [name] 08/29/2019 9:46 AM

## 2019-08-29 NOTE — Telephone Encounter (Signed)
Scheduled per 2/11 sch msg. Called and spoke with pt, confirmed 3/11 appt

## 2019-09-03 ENCOUNTER — Other Ambulatory Visit: Payer: Self-pay | Admitting: Family Medicine

## 2019-09-03 ENCOUNTER — Other Ambulatory Visit: Payer: Self-pay | Admitting: Pharmacist

## 2019-09-03 MED ORDER — WARFARIN SODIUM 5 MG PO TABS: 5.0000 mg | ORAL_TABLET | Freq: Every day | ORAL | 1 refills | Status: DC

## 2019-09-03 MED FILL — WARFARIN SODIUM 5 MG TABLET: 5 | 45 days supply | Qty: 90 | Fill #0

## 2019-09-18 NOTE — Progress Notes (Signed)
[facility]   [phone] [phone]   Clinic Follow up Note   Patient Care Team: Charlott Rakes, MD as PCP - General (Family Medicine)  Date of Service:  09/25/2019  CHIEF COMPLAINT: F/u recurrent DVT/ PE, iron deficient anemia     CURRENT THERAPY:  Coumadin, oral iron pills, IV feraheme as needed   INTERVAL HISTORY:  Donna [name] is here for a follow up of DVT/PR and IDA. She was last seen by me 1 year ago. She presents to the clinic alone. She notes she has not been sleeping well the last few days. She denies SOB but is mildly tired. She notes on 07/15/19 she had hysterectomy due to fibroids. She checks her Coumadin level with her PCP about once a month.     REVIEW OF SYSTEMS:   Constitutional: Denies fevers, chills or abnormal weight loss Eyes: Denies blurriness of vision Ears, nose, mouth, throat, and face: Denies mucositis or sore throat Respiratory: Denies cough, dyspnea or wheezes Cardiovascular: Denies palpitation, chest discomfort or lower extremity swelling Gastrointestinal:  Denies nausea, heartburn or change in bowel habits Skin: Denies abnormal skin rashes Lymphatics: Denies new lymphadenopathy or easy bruising Neurological:Denies numbness, tingling or new weaknesses Behavioral/Psych: Mood is stable, no new changes  All other systems were reviewed with the patient and are negative.  MEDICAL HISTORY:  Past Medical History:  Diagnosis Date  . DVT (deep venous thrombosis) ([place]) 12/06/2012  . Lung collapse   . PONV (postoperative nausea and vomiting)   . Pulmonary embolism affecting pregnancy     SURGICAL HISTORY: Past Surgical History:  Procedure Laterality Date  . HYSTERECTOMY ABDOMINAL WITH SALPINGECTOMY Bilateral 07/15/2019   Procedure: HYSTERECTOMY ABDOMINAL WITH SALPINGECTOMY;  Surgeon: [name], MD;  Location: [facility];  Service: Gynecology;  Laterality: Bilateral;  . RIB RESECTION      I have reviewed  the social history and family history with the patient and they are unchanged from previous note.  ALLERGIES:  is allergic to nsaids.  MEDICATIONS:  Current Outpatient Medications  Medication Sig Dispense Refill  . acetaminophen (TYLENOL) 500 MG tablet Take 1,000 mg by mouth every 6 (six) hours as needed.    Marland Kitchen atorvastatin (LIPITOR) 20 MG tablet Take 1 tablet (20 mg total) by mouth daily. (Patient taking differently: Take 20 mg by mouth at bedtime. ) 30 tablet 6  . ferrous sulfate 325 (65 FE) MG tablet One tablet orally twice daily with a meal (Patient not taking: Reported on 08/29/2019) 60 tablet 2  . gabapentin (NEURONTIN) 300 MG capsule Take 1 capsule (300 mg total) by mouth at bedtime. 30 capsule 0  . warfarin (COUMADIN) 5 MG tablet Take 1 tablet (5 mg total) by mouth daily. Take 1-2 tabs daily as directed by the Coumadin Clinic. 90 tablet 1   No current facility-administered medications for this visit.    PHYSICAL EXAMINATION: ECOG PERFORMANCE STATUS: 1 - Symptomatic but completely ambulatory  Vitals:   09/25/19 1005  BP: 107/70  Pulse: 85  Resp: 18  Temp: (!) 97.1 F (36.2 C)  SpO2: 100%   Filed Weights   09/25/19 1005  Weight: 278 lb 9.6 oz (126.4 kg)    Due to COVID19 we will limit examination to appearance. Patient had no complaints.  GENERAL:alert, no distress and comfortable SKIN: skin color normal, no rashes or significant lesions EYES: normal, Conjunctiva are pink and non-injected, sclera clear  NEURO: alert & oriented x 3 with fluent speech   LABORATORY  DATA:  I have reviewed the data as listed CBC Latest Ref Rng & Units 09/25/2019 07/15/2019 07/09/2019  WBC 4.0 - 10.5 K/uL 5.6 17.0(H) 5.3  Hemoglobin 12.0 - 15.0 g/dL 9.4(L) 9.8(L) 10.8(L)  Hematocrit 36.0 - 46.0 % 33.0(L) 32.8(L) 35.7(L)  Platelets 150 - 400 K/uL 369 387 293     CMP Latest Ref Rng & Units 09/25/2019 07/15/2019 07/09/2019  Glucose 70 - 99 mg/dL 100(H) - 109(H)  BUN 6 - 20 mg/dL 14 - 13   Creatinine 0.44 - 1.00 mg/dL 0.85 1.00 0.86  Sodium 135 - 145 mmol/L 138 - 137  Potassium 3.5 - 5.1 mmol/L 3.8 - 4.0  Chloride 98 - 111 mmol/L 106 - 107  CO2 22 - 32 mmol/L 24 - 22  Calcium 8.9 - 10.3 mg/dL 8.1(L) - 8.4(L)  Total Protein 6.5 - 8.1 g/dL 6.7 - -  Total Bilirubin 0.3 - 1.2 mg/dL 0.2(L) - -  Alkaline Phos 38 - 126 U/L 79 - -  AST 15 - 41 U/L 12(L) - -  ALT 0 - 44 U/L 10 - -    CT AP 12/23/18  IMPRESSION: 1. Punctate nonobstructing stone in the lower pole the left kidney. No evidence of hydronephrosis. 2. Significant interval increase in size of the patient's bulky heterogeneous uterus with new low-attenuation areas near the uterine fundus. Overall, the uterus currently measures 26 x 17 cm (previously measuring 15 x 12 cm on 02/19/2015). The heterogeneous appearance is favored to be secondary to underlying fibroids. Outpatient gynecology follow-up is recommended. An underlying mass cannot be excluded. 3. Significant mass effect is noted on the patient's urinary bladder from the enlarged uterus. 4. Hepatic steatosis.    HYSTERECTOMY ABDOMINAL WITH B/l SALPINGECTOMY by Dr Roselie Awkward 07/15/19 FINAL MICROSCOPIC DIAGNOSIS:   A. UTERUS, CERVIX AND BILATERAL FALLOPIAN TUBES, HYSTERECTOMY:  - Uterus:    Endometrium: Proliferative endometrium. No hyperplasia or  malignancy.    Myometrium: Leiomyomata. No malignancy.    Serosa: Benign mesothelial cysts. No malignancy.  - Cervix: Benign squamous and endocervical mucosa. No dysplasia or  malignancy.  - Bilateral fallopian tubes: Paratubal cysts. No malignancy.    RADIOGRAPHIC STUDIES: I have personally reviewed the radiological images as listed and agreed with the findings in the report. No results found.   ASSESSMENT & PLAN:  Donna [name] is a 49 y.o. female with    1. Anemia of iron deficiency, likely secondary to menorrhagia -She had fairly low ferritin and iron level, all consistent with iron  deficiency. -She previously  had good response to oral iron supplement in the past, but she is not very complaint, and has been receiving IV feraheme every 2-3 months. She responded well, anemia resolved afterwards. -IV Feraheme if ferritin < 50 -Iagain encouragedher to continue her oral iron, she is taking it twice daily. -She last received IV feraheme on 04/18/19.  -She underwent Hysterectomy due to fibroids on 07/15/19. This should help reduce her menorrhagia and improve her anemia.  -Labs reviewed, Hg 9.4 which is lower since her surgery. Iron panel still pending but likely lower. I will set up IV iron this week or next week. She is agreeable.  -Lab in 3 months and if normalized I will see her as needed. She is agreeable. If she still has anemia and iron deficiency then she will likely need GI workup   2. Recurrent DVT/PE, (+) lupus anticoagulant -She had provoked DVT and PE in 2014, and second episode off on provoked TE in December 2015  after she came off Coumadin. -Her PTT Lupus anticoagulant was positive on 12/05/2012 and decreased Protein C levels. Her repeated lupus anticoagulant is negative without detection of a lupus anticoagulant. Hypercoagulopathy workup was negative when she was off Coumadin. -Ipreviouslyrecommendedanticoagulation for life long if there is no contraindication such as severe bleeding. She could not tolerate Xarelto.  -We previously discussed preventive strategies like avoid cigarette smoking, contraceptives, and wear compression stocks on Distance Travel -She is tolerating Coumadin well, she will continue monthly coumadin labs with her PCP office.  3. Obesity -Continued dieting and exercises per her PCP.   4. Elevated Cholesterol  -On Lipitor  -F/u PCP   5. Hypocalcemia, Low albuminemia -Her Ca at 8.1 and albumin 2.9 today (09/25/19). I recommend she continue oral calcium and increase protein in diet   Plan:  -Her ferritin is 5 today, will set  up iv feraheme infusion weeklyX2  - Lab in 3 months with phone call a few days after.    No problem-specific Assessment & Plan notes found for this encounter.   No orders of the defined types were placed in this encounter.  All questions were answered. The patient knows to call the clinic with any problems, questions or concerns. No barriers to learning was detected. The total time spent in the appointment was 20 minutes.     Truitt Merle, MD 09/25/2019   I, Joslyn Devon, am acting as scribe for Truitt Merle, MD.   I have reviewed the above documentation for accuracy and completeness, and I agree with the above.

## 2019-09-25 ENCOUNTER — Encounter: Payer: Self-pay | Admitting: Hematology

## 2019-09-25 ENCOUNTER — Inpatient Hospital Stay: Payer: Self-pay

## 2019-09-25 ENCOUNTER — Other Ambulatory Visit: Payer: Self-pay

## 2019-09-25 ENCOUNTER — Inpatient Hospital Stay: Payer: Self-pay | Attending: Hematology | Admitting: Hematology

## 2019-09-25 VITALS — BP 107/70 | HR 85 | Temp 97.1°F | Resp 18 | Ht 70.0 in | Wt 278.6 lb

## 2019-09-25 DIAGNOSIS — Z86711 Personal history of pulmonary embolism: Secondary | ICD-10-CM | POA: Insufficient documentation

## 2019-09-25 DIAGNOSIS — D509 Iron deficiency anemia, unspecified: Secondary | ICD-10-CM | POA: Insufficient documentation

## 2019-09-25 DIAGNOSIS — Z9079 Acquired absence of other genital organ(s): Secondary | ICD-10-CM | POA: Insufficient documentation

## 2019-09-25 DIAGNOSIS — Z86718 Personal history of other venous thrombosis and embolism: Secondary | ICD-10-CM | POA: Insufficient documentation

## 2019-09-25 DIAGNOSIS — Z90710 Acquired absence of both cervix and uterus: Secondary | ICD-10-CM | POA: Insufficient documentation

## 2019-09-25 DIAGNOSIS — E669 Obesity, unspecified: Secondary | ICD-10-CM | POA: Insufficient documentation

## 2019-09-25 DIAGNOSIS — E7800 Pure hypercholesterolemia, unspecified: Secondary | ICD-10-CM | POA: Insufficient documentation

## 2019-09-25 DIAGNOSIS — Z79899 Other long term (current) drug therapy: Secondary | ICD-10-CM | POA: Insufficient documentation

## 2019-09-25 DIAGNOSIS — Z7901 Long term (current) use of anticoagulants: Secondary | ICD-10-CM | POA: Insufficient documentation

## 2019-09-25 DIAGNOSIS — D6862 Lupus anticoagulant syndrome: Secondary | ICD-10-CM | POA: Insufficient documentation

## 2019-09-25 LAB — CBC WITH DIFFERENTIAL/PLATELET
Abs Immature Granulocytes: 0.02 10*3/uL (ref 0.00–0.07)
Basophils Absolute: 0 10*3/uL (ref 0.0–0.1)
Basophils Relative: 1 %
Eosinophils Absolute: 0.1 10*3/uL (ref 0.0–0.5)
Eosinophils Relative: 2 %
HCT: 33 % — ABNORMAL LOW (ref 36.0–46.0)
Hemoglobin: 9.4 g/dL — ABNORMAL LOW (ref 12.0–15.0)
Immature Granulocytes: 0 %
Lymphocytes Relative: 41 %
Lymphs Abs: 2.3 10*3/uL (ref 0.7–4.0)
MCH: 19.1 pg — ABNORMAL LOW (ref 26.0–34.0)
MCHC: 28.5 g/dL — ABNORMAL LOW (ref 30.0–36.0)
MCV: 67.2 fL — ABNORMAL LOW (ref 80.0–100.0)
Monocytes Absolute: 0.5 10*3/uL (ref 0.1–1.0)
Monocytes Relative: 8 %
Neutro Abs: 2.7 10*3/uL (ref 1.7–7.7)
Neutrophils Relative %: 48 %
Platelets: 369 10*3/uL (ref 150–400)
RBC: 4.91 MIL/uL (ref 3.87–5.11)
RDW: 19 % — ABNORMAL HIGH (ref 11.5–15.5)
WBC: 5.6 10*3/uL (ref 4.0–10.5)
nRBC: 0 % (ref 0.0–0.2)

## 2019-09-25 LAB — COMPREHENSIVE METABOLIC PANEL
ALT: 10 U/L (ref 0–44)
AST: 12 U/L — ABNORMAL LOW (ref 15–41)
Albumin: 2.9 g/dL — ABNORMAL LOW (ref 3.5–5.0)
Alkaline Phosphatase: 79 U/L (ref 38–126)
Anion gap: 8 (ref 5–15)
BUN: 14 mg/dL (ref 6–20)
CO2: 24 mmol/L (ref 22–32)
Calcium: 8.1 mg/dL — ABNORMAL LOW (ref 8.9–10.3)
Chloride: 106 mmol/L (ref 98–111)
Creatinine, Ser: 0.85 mg/dL (ref 0.44–1.00)
GFR calc Af Amer: >60 mL/min (ref >60)
GFR calc non Af Amer: >60 mL/min (ref >60)
Glucose, Bld: 100 mg/dL — ABNORMAL HIGH (ref 70–99)
Potassium: 3.8 mmol/L (ref 3.5–5.1)
Sodium: 138 mmol/L (ref 135–145)
Total Bilirubin: 0.2 mg/dL — ABNORMAL LOW (ref 0.3–1.2)
Total Protein: 6.7 g/dL (ref 6.5–8.1)

## 2019-09-25 LAB — IRON AND TIBC
Iron: 17 ug/dL — ABNORMAL LOW (ref 41–142)
Saturation Ratios: 4 % — ABNORMAL LOW (ref 21–57)
TIBC: 396 ug/dL (ref 236–444)
UIBC: 379 ug/dL (ref 120–384)

## 2019-09-25 LAB — FERRITIN: Ferritin: 5 ng/mL — ABNORMAL LOW (ref 11–307)

## 2019-09-26 ENCOUNTER — Telehealth: Payer: Self-pay | Admitting: Hematology

## 2019-09-26 NOTE — Telephone Encounter (Signed)
Scheduled apt per 3/11 sch message - pt aware of appt date and time

## 2019-09-26 NOTE — Telephone Encounter (Signed)
Scheduled appt per 3/11 los. ° °Spoke with pt and she is aware of the appt date and time. °

## 2019-10-01 ENCOUNTER — Ambulatory Visit: Payer: Self-pay | Attending: Family Medicine | Admitting: Pharmacist

## 2019-10-01 ENCOUNTER — Other Ambulatory Visit: Payer: Self-pay

## 2019-10-01 DIAGNOSIS — Z7901 Long term (current) use of anticoagulants: Secondary | ICD-10-CM

## 2019-10-01 LAB — POCT INR: INR: 2.4 (ref 2.0–3.0)

## 2019-10-01 MED FILL — ?ATORVASTATIN 20 MG TABLET: 20 | 30 days supply | Qty: 30 | Fill #5

## 2019-10-03 ENCOUNTER — Other Ambulatory Visit: Payer: Self-pay

## 2019-10-03 ENCOUNTER — Inpatient Hospital Stay: Payer: Self-pay

## 2019-10-03 VITALS — BP 105/61 | HR 80 | Temp 98.2°F | Resp 17

## 2019-10-03 DIAGNOSIS — D509 Iron deficiency anemia, unspecified: Secondary | ICD-10-CM

## 2019-10-03 MED ORDER — SODIUM CHLORIDE 0.9 % IV SOLN
510.0000 mg | Freq: Once | INTRAVENOUS | Status: AC
  Administered 2019-10-03: 15:00:00 510 mg via INTRAVENOUS
  Filled 2019-10-03: qty 510

## 2019-10-03 MED ORDER — SODIUM CHLORIDE 0.9 % IV SOLN
Freq: Once | INTRAVENOUS | Status: AC
  Filled 2019-10-03: qty 250

## 2019-10-03 NOTE — Patient Instructions (Signed)

## 2019-10-10 ENCOUNTER — Inpatient Hospital Stay: Payer: Self-pay

## 2019-10-10 ENCOUNTER — Other Ambulatory Visit: Payer: Self-pay

## 2019-10-10 VITALS — BP 111/69 | HR 86 | Temp 98.4°F | Resp 16

## 2019-10-10 DIAGNOSIS — D509 Iron deficiency anemia, unspecified: Secondary | ICD-10-CM

## 2019-10-10 MED ORDER — SODIUM CHLORIDE 0.9 % IV SOLN
INTRAVENOUS | Status: DC
  Filled 2019-10-10: qty 250

## 2019-10-10 MED ORDER — SODIUM CHLORIDE 0.9 % IV SOLN
510.0000 mg | Freq: Once | INTRAVENOUS | Status: AC
  Administered 2019-10-10: 510 mg via INTRAVENOUS
  Filled 2019-10-10: qty 510

## 2019-10-10 NOTE — Progress Notes (Signed)
Pt declined to stay 30 minutes post-feraheme for observation. Vital signs stable at discharge.

## 2019-10-10 NOTE — Patient Instructions (Signed)

## 2019-11-03 MED FILL — WARFARIN SODIUM 5 MG TABLET: 5 | 45 days supply | Qty: 90 | Fill #1

## 2019-11-04 ENCOUNTER — Ambulatory Visit: Payer: Self-pay | Attending: Family Medicine | Admitting: Pharmacist

## 2019-11-04 ENCOUNTER — Other Ambulatory Visit: Payer: Self-pay

## 2019-11-04 DIAGNOSIS — Z7901 Long term (current) use of anticoagulants: Secondary | ICD-10-CM

## 2019-11-04 LAB — POCT INR: INR: 2.9 (ref 2.0–3.0)

## 2019-11-04 MED FILL — ?ATORVASTATIN 20 MG TABLET: 20 | 30 days supply | Qty: 30 | Fill #6

## 2019-11-07 ENCOUNTER — Other Ambulatory Visit: Payer: Self-pay

## 2019-11-07 ENCOUNTER — Ambulatory Visit (INDEPENDENT_AMBULATORY_CARE_PROVIDER_SITE_OTHER): Payer: Medicaid Other

## 2019-11-07 ENCOUNTER — Telehealth: Payer: Self-pay | Admitting: *Deleted

## 2019-11-07 ENCOUNTER — Ambulatory Visit (INDEPENDENT_AMBULATORY_CARE_PROVIDER_SITE_OTHER): Payer: Self-pay | Admitting: Podiatry

## 2019-11-07 VITALS — Temp 98.0°F

## 2019-11-07 DIAGNOSIS — L923 Foreign body granuloma of the skin and subcutaneous tissue: Secondary | ICD-10-CM

## 2019-11-07 DIAGNOSIS — M778 Other enthesopathies, not elsewhere classified: Secondary | ICD-10-CM

## 2019-11-07 DIAGNOSIS — M79671 Pain in right foot: Secondary | ICD-10-CM

## 2019-11-07 DIAGNOSIS — M795 Residual foreign body in soft tissue: Secondary | ICD-10-CM

## 2019-11-07 NOTE — Progress Notes (Signed)
  Subjective:  Patient ID: Donna [name], female    DOB: 10/09/1970,  MRN: [id number]  Chief Complaint  Patient presents with  . Foot Problem    R plantar forefoot, submet 2-3. x5 months. Pt stated, "I took my shoes [place] for an airport and stepped on a piece of metal. It was sticking out of the floor - nothing broke off of the metal. This created a wound, and it got infected. Treated at urgent cares. X-ray was normal. I still have 5-8/10 pain when I walk".    49 y.o. female presents with the above complaint. History confirmed with patient.   Objective:  Physical Exam: warm, good capillary refill, no trophic changes or ulcerative lesions, normal DP and PT pulses and normal sensory exam. Left Foot: normal exam, no swelling, tenderness, instability; ligaments intact, full range of motion of all ankle/foot joints  Right Foot: pain between plantar 3rd/4th metatarsal area with healed puncture lesion superficially.   No images are attached to the encounter.  Radiographs: X-ray of the right foot: no fracture, dislocation, swelling or degenerative changes noted no foreign body. Assessment:   1. Residual foreign body in soft tissue   2. Foreign body granuloma of skin and subcutaneous tissue   3. Pain of right foot      Plan:  Patient was evaluated and treated and all questions answered.  Hx of Foreign Body Injury R Foot -Repeat XR no foreign body. -Symptoms may be related to nerve injury -Skin has healed superficially but she may have residual deep sinus tract or scar tissue. Prior to injection therapy would consider [place] to eval and r/o any other foreign body. -Order ST [place] RLE -F/u in 1 month for re-eval  Return in about 4 weeks (around 12/05/2019).

## 2019-11-07 NOTE — Telephone Encounter (Signed)
Faxed orders to [facility].

## 2019-11-07 NOTE — Telephone Encounter (Signed)
-----   Message from Evelina Bucy, DPM sent at 11/07/2019 11:56 AM EDT ----- Can we order [place] R foot eval for foreign body. No FB on XR

## 2019-11-19 ENCOUNTER — Other Ambulatory Visit: Payer: Self-pay | Admitting: Podiatry

## 2019-11-19 DIAGNOSIS — M778 Other enthesopathies, not elsewhere classified: Secondary | ICD-10-CM

## 2019-11-20 ENCOUNTER — Encounter (HOSPITAL_COMMUNITY): Payer: Self-pay

## 2019-11-20 ENCOUNTER — Ambulatory Visit (HOSPITAL_COMMUNITY): Payer: Self-pay

## 2019-12-01 ENCOUNTER — Ambulatory Visit (HOSPITAL_COMMUNITY)
Admission: RE | Admit: 2019-12-01 | Discharge: 2019-12-01 | Disposition: A | Discharge status: Home / Self Care | Payer: Self-pay | Source: Ambulatory Visit | Attending: Podiatry | Admitting: Podiatry

## 2019-12-01 ENCOUNTER — Other Ambulatory Visit: Payer: Self-pay

## 2019-12-01 DIAGNOSIS — L923 Foreign body granuloma of the skin and subcutaneous tissue: Secondary | ICD-10-CM | POA: Insufficient documentation

## 2019-12-01 DIAGNOSIS — M79671 Pain in right foot: Secondary | ICD-10-CM | POA: Insufficient documentation

## 2019-12-01 DIAGNOSIS — M795 Residual foreign body in soft tissue: Secondary | ICD-10-CM | POA: Insufficient documentation

## 2019-12-04 ENCOUNTER — Other Ambulatory Visit: Payer: Self-pay

## 2019-12-04 ENCOUNTER — Ambulatory Visit: Payer: Medicaid Other | Attending: Family Medicine | Admitting: Pharmacist

## 2019-12-04 DIAGNOSIS — Z7901 Long term (current) use of anticoagulants: Secondary | ICD-10-CM | POA: Insufficient documentation

## 2019-12-04 LAB — POCT INR: INR: 3.9 — AB (ref 2.0–3.0)

## 2019-12-05 ENCOUNTER — Ambulatory Visit: Payer: Medicaid Other | Admitting: Podiatry

## 2019-12-12 ENCOUNTER — Other Ambulatory Visit: Payer: Self-pay

## 2019-12-12 ENCOUNTER — Ambulatory Visit: Payer: Medicaid Other | Admitting: Podiatry

## 2019-12-12 VITALS — Temp 97.7°F

## 2019-12-12 DIAGNOSIS — L905 Scar conditions and fibrosis of skin: Secondary | ICD-10-CM

## 2019-12-12 DIAGNOSIS — M795 Residual foreign body in soft tissue: Secondary | ICD-10-CM

## 2019-12-12 DIAGNOSIS — L923 Foreign body granuloma of the skin and subcutaneous tissue: Secondary | ICD-10-CM

## 2019-12-12 NOTE — Progress Notes (Signed)
  Subjective:  Patient ID: Donna [name], female    DOB: [birth year],  MRN: [id number]  Chief Complaint  Patient presents with  . Follow-up    R foot plantar forefoot injury. Pt stated, "Some days are worse than others - 4-5/10 most days. Worse while walking and at night. Had my ultrasound last week".   49 y.o. female presents with the above complaint. History confirmed with patient.   Objective:  Physical Exam: warm, good capillary refill, no trophic changes or ulcerative lesions, normal DP and PT pulses and normal sensory exam. Left Foot: normal exam, no swelling, tenderness, instability; ligaments intact, full range of motion of all ankle/foot joints  Right Foot: pain between plantar 3rd/4th metatarsal area with healed puncture lesion superficially.  Assessment:   1. Foreign body granuloma of skin and subcutaneous tissue   2. Residual foreign body in soft tissue   3. Scar tissue      Plan:  Patient was evaluated and treated and all questions answered.  Hx of Foreign Body Injury R Foot -[place] without residual foreign body or fluid collection. Reviewed with patient. -Still with pain -Order MRI to eval for scar tissue and foreign body granuloma  No follow-ups on file.

## 2019-12-16 ENCOUNTER — Telehealth: Payer: Self-pay | Admitting: *Deleted

## 2019-12-16 DIAGNOSIS — L905 Scar conditions and fibrosis of skin: Secondary | ICD-10-CM

## 2019-12-16 DIAGNOSIS — L923 Foreign body granuloma of the skin and subcutaneous tissue: Secondary | ICD-10-CM

## 2019-12-16 DIAGNOSIS — M795 Residual foreign body in soft tissue: Secondary | ICD-10-CM

## 2019-12-16 DIAGNOSIS — M79671 Pain in right foot: Secondary | ICD-10-CM

## 2019-12-16 NOTE — Telephone Encounter (Signed)
Faxed orders to [facility] - Radiology Main Scheduling.

## 2019-12-16 NOTE — Telephone Encounter (Signed)
-----   Message from Evelina Bucy, DPM sent at 12/12/2019  2:40 PM EDT ----- Order MRI Right foot - r/o foreign body granuloma right foot

## 2019-12-24 ENCOUNTER — Other Ambulatory Visit: Payer: Self-pay

## 2019-12-24 ENCOUNTER — Ambulatory Visit (HOSPITAL_COMMUNITY)
Admission: RE | Admit: 2019-12-24 | Discharge: 2019-12-24 | Disposition: A | Discharge status: Home / Self Care | Payer: Self-pay | Source: Ambulatory Visit | Attending: Podiatry | Admitting: Podiatry

## 2019-12-24 ENCOUNTER — Inpatient Hospital Stay: Payer: Self-pay | Attending: Hematology

## 2019-12-24 DIAGNOSIS — L923 Foreign body granuloma of the skin and subcutaneous tissue: Secondary | ICD-10-CM | POA: Insufficient documentation

## 2019-12-24 DIAGNOSIS — M795 Residual foreign body in soft tissue: Secondary | ICD-10-CM | POA: Insufficient documentation

## 2019-12-24 DIAGNOSIS — Z86711 Personal history of pulmonary embolism: Secondary | ICD-10-CM | POA: Insufficient documentation

## 2019-12-24 DIAGNOSIS — L905 Scar conditions and fibrosis of skin: Secondary | ICD-10-CM | POA: Insufficient documentation

## 2019-12-24 DIAGNOSIS — E669 Obesity, unspecified: Secondary | ICD-10-CM | POA: Insufficient documentation

## 2019-12-24 DIAGNOSIS — Z86718 Personal history of other venous thrombosis and embolism: Secondary | ICD-10-CM | POA: Insufficient documentation

## 2019-12-24 DIAGNOSIS — M79671 Pain in right foot: Secondary | ICD-10-CM | POA: Insufficient documentation

## 2019-12-24 DIAGNOSIS — Z7901 Long term (current) use of anticoagulants: Secondary | ICD-10-CM | POA: Insufficient documentation

## 2019-12-24 DIAGNOSIS — Z9079 Acquired absence of other genital organ(s): Secondary | ICD-10-CM | POA: Insufficient documentation

## 2019-12-24 DIAGNOSIS — Z79899 Other long term (current) drug therapy: Secondary | ICD-10-CM | POA: Insufficient documentation

## 2019-12-24 DIAGNOSIS — D509 Iron deficiency anemia, unspecified: Secondary | ICD-10-CM

## 2019-12-24 DIAGNOSIS — Z90710 Acquired absence of both cervix and uterus: Secondary | ICD-10-CM | POA: Insufficient documentation

## 2019-12-24 LAB — FERRITIN: Ferritin: 41 ng/mL (ref 11–307)

## 2019-12-24 LAB — CBC WITH DIFFERENTIAL/PLATELET
Abs Immature Granulocytes: 0.01 10*3/uL (ref 0.00–0.07)
Basophils Absolute: 0 10*3/uL (ref 0.0–0.1)
Basophils Relative: 1 %
Eosinophils Absolute: 0.1 10*3/uL (ref 0.0–0.5)
Eosinophils Relative: 2 %
HCT: 44.2 % (ref 36.0–46.0)
Hemoglobin: 14.1 g/dL (ref 12.0–15.0)
Immature Granulocytes: 0 %
Lymphocytes Relative: 44 %
Lymphs Abs: 2.1 10*3/uL (ref 0.7–4.0)
MCH: 27.3 pg (ref 26.0–34.0)
MCHC: 31.9 g/dL (ref 30.0–36.0)
MCV: 85.7 fL (ref 80.0–100.0)
Monocytes Absolute: 0.4 10*3/uL (ref 0.1–1.0)
Monocytes Relative: 8 %
Neutro Abs: 2.1 10*3/uL (ref 1.7–7.7)
Neutrophils Relative %: 45 %
Platelets: 293 10*3/uL (ref 150–400)
RBC: 5.16 MIL/uL — ABNORMAL HIGH (ref 3.87–5.11)
WBC: 4.7 10*3/uL (ref 4.0–10.5)
nRBC: 0 % (ref 0.0–0.2)

## 2019-12-24 LAB — IRON AND TIBC
Iron: 73 ug/dL (ref 41–142)
Saturation Ratios: 22 % (ref 21–57)
TIBC: 326 ug/dL (ref 236–444)
UIBC: 253 ug/dL (ref 120–384)

## 2019-12-24 MED ORDER — GADOBUTROL 1 MMOL/ML IV SOLN
10.0000 mL | Freq: Once | INTRAVENOUS | Status: AC | PRN
  Administered 2019-12-24: 10 mL via INTRAVENOUS

## 2019-12-25 ENCOUNTER — Ambulatory Visit: Payer: Self-pay | Attending: Family Medicine | Admitting: Pharmacist

## 2019-12-25 DIAGNOSIS — E7800 Pure hypercholesterolemia, unspecified: Secondary | ICD-10-CM

## 2019-12-25 DIAGNOSIS — Z7901 Long term (current) use of anticoagulants: Secondary | ICD-10-CM

## 2019-12-25 LAB — POCT INR: INR: 1.3 — AB (ref 2.0–3.0)

## 2019-12-25 MED ORDER — ATORVASTATIN CALCIUM 20 MG PO TABS: 20.0000 mg | ORAL_TABLET | Freq: Every day | ORAL | 6 refills | Status: DC

## 2019-12-25 MED ORDER — WARFARIN SODIUM 5 MG PO TABS: 5.0000 mg | ORAL_TABLET | Freq: Every day | ORAL | 1 refills | Status: DC

## 2019-12-25 NOTE — Progress Notes (Signed)
[place]   [phone] [phone]   Clinic Follow up Note   Patient Care Team: Charlott Rakes, MD as PCP - General (Family Medicine)   I connected with Loreen Freud Oshima on 12/26/2019 at  1:00 PM EDT by telephone visit and verified that I am speaking with the correct person using two identifiers.  I discussed the limitations, risks, security and privacy concerns of performing an evaluation and management service by telephone and the availability of in person appointments. I also discussed with the patient that there may be a patient responsible charge related to this service. The patient expressed understanding and agreed to proceed.   Other persons participating in the visit and their role in the encounter:  None   Patient's location:  Hospital (she was visiting her daughter) Provider's location:  Office   CHIEF COMPLAINT: F/u recurrent DVT/ PE, iron deficient anemia   CURRENT THERAPY:  Coumadin, oral iron pills, IV feraheme as needed  INTERVAL HISTORY:  Donna [name] is here for a follow up of DVT/PR and IDA.  She identified herself by name and date of birth.  She is doing well lately.  Her last IV iron was in March 2021.  She had hysterectomy in December 2020.  She denies any other new signs of bleeding.  Review of systems negative.  MEDICAL HISTORY:  Past Medical History:  Diagnosis Date   DVT (deep venous thrombosis) ([place]) 12/06/2012   Lung collapse    PONV (postoperative nausea and vomiting)    Pulmonary embolism affecting pregnancy     SURGICAL HISTORY: Past Surgical History:  Procedure Laterality Date   HYSTERECTOMY ABDOMINAL WITH SALPINGECTOMY Bilateral 07/15/2019   Procedure: HYSTERECTOMY ABDOMINAL WITH SALPINGECTOMY;  Surgeon: [name], MD;  Location: [place];  Service: Gynecology;  Laterality: Bilateral;   RIB RESECTION      I have reviewed the social history and family history with the patient and they  are unchanged from previous note.  ALLERGIES:  is allergic to nsaids.  MEDICATIONS:  Current Outpatient Medications  Medication Sig Dispense Refill   acetaminophen (TYLENOL) 500 MG tablet Take 1,000 mg by mouth every 6 (six) hours as needed.     atorvastatin (LIPITOR) 20 MG tablet Take 1 tablet (20 mg total) by mouth daily. (Patient taking differently: Take 20 mg by mouth at bedtime. ) 30 tablet 6   ferrous sulfate 325 (65 FE) MG tablet One tablet orally twice daily with a meal 60 tablet 2   gabapentin (NEURONTIN) 300 MG capsule Take 1 capsule (300 mg total) by mouth at bedtime. 30 capsule 0   warfarin (COUMADIN) 5 MG tablet Take 1 tablet (5 mg total) by mouth daily. Take 1-2 tabs daily as directed by the Coumadin Clinic. 90 tablet 1   No current facility-administered medications for this visit.    PHYSICAL EXAMINATION: ECOG PERFORMANCE STATUS: 0 - Asymptomatic  No vitals taken today, Exam not performed today   LABORATORY DATA:  I have reviewed the data as listed CBC Latest Ref Rng & Units 12/24/2019 09/25/2019 07/15/2019  WBC 4.0 - 10.5 K/uL 4.7 5.6 17.0(H)  Hemoglobin 12.0 - 15.0 g/dL 14.1 9.4(L) 9.8(L)  Hematocrit 36 - 46 % 44.2 33.0(L) 32.8(L)  Platelets 150 - 400 K/uL 293 369 387     CMP Latest Ref Rng & Units 09/25/2019 07/15/2019 07/09/2019  Glucose 70 - 99 mg/dL 100(H) - 109(H)  BUN 6 - 20 mg/dL 14 - 13  Creatinine 0.44 - 1.00  mg/dL 0.85 1.00 0.86  Sodium 135 - 145 mmol/L 138 - 137  Potassium 3.5 - 5.1 mmol/L 3.8 - 4.0  Chloride 98 - 111 mmol/L 106 - 107  CO2 22 - 32 mmol/L 24 - 22  Calcium 8.9 - 10.3 mg/dL 8.1(L) - 8.4(L)  Total Protein 6.5 - 8.1 g/dL 6.7 - -  Total Bilirubin 0.3 - 1.2 mg/dL 0.2(L) - -  Alkaline Phos 38 - 126 U/L 79 - -  AST 15 - 41 U/L 12(L) - -  ALT 0 - 44 U/L 10 - -      RADIOGRAPHIC STUDIES: I have personally reviewed the radiological images as listed and agreed with the findings in the report. No results found.   ASSESSMENT & PLAN:   Donna [name] is a 49 y.o. female with    1. Anemia of iron deficiency, likely secondary to menorrhagia -She had fairly low ferritin and iron level, all consistent with iron deficiency. -She previously  had good response to oral iron supplement in the past, but she is not very complaint, and has been receiving IV feraheme every 2-3 months. She responded well, anemia resolved afterwards. -IV Feraheme if ferritin < 50 -Iagain encouragedher to continue her oral iron, she is taking it twice daily. -She last received IV feraheme on 10/10/2019 -She underwent Hysterectomy due to fibroids on 07/15/19. This should help reduce her menorrhagia and improve her anemia.  -Labs from December 24, 2018 were reviewed, ferritin 41, serum iron and TIBC were normal, hemoglobin 14.1.  She is doing well. -Continue monitoring.  I anticipate her need for IV iron or significant decrease since she has had hysterectomy.   2. Recurrent DVT/PE, (+) lupus anticoagulant -She had provoked DVT and PE in 2014, and second episode off on provoked TE in December 2015 after she came off Coumadin. -Her PTT Lupus anticoagulant was positive on 12/05/2012 and decreased Protein C levels. Her repeated lupus anticoagulant is negative without detection of a lupus anticoagulant. Hypercoagulopathy workup was negative when she was off Coumadin. -Ipreviouslyrecommendedanticoagulation for life long if there is no contraindication such as severe bleeding. She could not tolerate Xarelto.  -We previously discussed preventive strategies like avoid cigarette smoking, contraceptives, and wear compression stocks on Distance Travel -She is tolerating Coumadin well, she will continue monthly coumadin labs with her PCP office.  3. Obesity -Continued dieting and exercises per her PCP.     Plan:  -Lab from December 24, 2018 were reviewed, anemia resolved, iron level is adequate, no need IV iron now -Labs every 4 months, follow-up in 8  months.   No problem-specific Assessment & Plan notes found for this encounter.   No orders of the defined types were placed in this encounter.  I discussed the assessment and treatment plan with the patient. The patient was provided an opportunity to ask questions and all were answered. The patient agreed with the plan and demonstrated an understanding of the instructions.  The patient was advised to call back or seek an in-person evaluation if the symptoms worsen or if the condition fails to improve as anticipated.  The total time spent in the appointment was 10 minutes.    Truitt Merle, MD 12/26/2019  I, Joslyn Devon, am acting as scribe for Truitt Merle, MD.   I have reviewed the above documentation for accuracy and completeness, and I agree with the above.

## 2019-12-26 ENCOUNTER — Other Ambulatory Visit: Payer: Self-pay

## 2019-12-26 ENCOUNTER — Inpatient Hospital Stay (HOSPITAL_BASED_OUTPATIENT_CLINIC_OR_DEPARTMENT_OTHER): Payer: Self-pay | Admitting: Hematology

## 2019-12-26 DIAGNOSIS — Z86718 Personal history of other venous thrombosis and embolism: Secondary | ICD-10-CM

## 2019-12-26 DIAGNOSIS — D509 Iron deficiency anemia, unspecified: Secondary | ICD-10-CM

## 2019-12-26 MED FILL — ?ATORVASTATIN 20 MG TABLET: 20 | 30 days supply | Qty: 30 | Fill #0

## 2019-12-28 ENCOUNTER — Encounter: Payer: Self-pay | Admitting: Hematology

## 2019-12-29 ENCOUNTER — Telehealth: Payer: Self-pay | Admitting: Hematology

## 2019-12-29 NOTE — Telephone Encounter (Signed)
Scheduled appt per 6/11 los.  Spoke with pt and she is aware of the appt date and time.

## 2020-01-08 ENCOUNTER — Other Ambulatory Visit: Payer: Self-pay

## 2020-01-08 ENCOUNTER — Ambulatory Visit: Payer: Self-pay | Attending: Family Medicine | Admitting: Pharmacist

## 2020-01-08 DIAGNOSIS — Z7901 Long term (current) use of anticoagulants: Secondary | ICD-10-CM

## 2020-01-08 LAB — POCT INR: INR: 2.5 (ref 2.0–3.0)

## 2020-01-16 ENCOUNTER — Ambulatory Visit (INDEPENDENT_AMBULATORY_CARE_PROVIDER_SITE_OTHER): Payer: Self-pay | Admitting: Podiatry

## 2020-01-16 ENCOUNTER — Other Ambulatory Visit: Payer: Self-pay

## 2020-01-16 ENCOUNTER — Encounter: Payer: Self-pay | Admitting: Podiatry

## 2020-01-16 DIAGNOSIS — L923 Foreign body granuloma of the skin and subcutaneous tissue: Secondary | ICD-10-CM

## 2020-01-16 DIAGNOSIS — G5761 Lesion of plantar nerve, right lower limb: Secondary | ICD-10-CM

## 2020-01-16 DIAGNOSIS — M795 Residual foreign body in soft tissue: Secondary | ICD-10-CM

## 2020-01-16 NOTE — Progress Notes (Signed)
  Subjective:  Patient ID: Donna [name], female    DOB: Sep 11, 1970,  MRN: [id number]  Chief Complaint  Patient presents with  . Foot Injury    right ball of foot is doing worse than before object was removed, MRI results today    49 y.o. female presents with the above complaint. History confirmed with patient.   Objective:  Physical Exam: warm, good capillary refill, no trophic changes or ulcerative lesions, normal DP and PT pulses and normal sensory exam.  Right Foot: POP R 3rd interspace   Assessment:   1. Morton neuroma, right   2. Foreign body granuloma of skin and subcutaneous tissue   3. Residual foreign body in soft tissue    Plan:  Patient was evaluated and treated and all questions answered.  Foreign Body -MRI reviewed, no evidence of foreign body or explanation for pain. She does feel this pain is resolved however.   Morton Neuroma  -Morton neuroma appears to be different from previous concern. -Injection delivered to the affected interspaces  Procedure: Neuroma Injection Location: Right 3rd interspace Skin Prep: Alcohol. Injectate: 0.5 cc 0.5% marcaine plain, 0.5 cc dexamethasone phosphate. Disposition: Patient tolerated procedure well. Injection site dressed with a band-aid.  Return in about 4 weeks (around 02/13/2020) for Neuroma.

## 2020-02-05 ENCOUNTER — Other Ambulatory Visit: Payer: Self-pay

## 2020-02-05 ENCOUNTER — Ambulatory Visit: Payer: Medicaid Other | Attending: Family Medicine | Admitting: Pharmacist

## 2020-02-05 DIAGNOSIS — Z7901 Long term (current) use of anticoagulants: Secondary | ICD-10-CM | POA: Insufficient documentation

## 2020-02-05 LAB — POCT INR: INR: 3.3 — AB (ref 2.0–3.0)

## 2020-02-13 ENCOUNTER — Other Ambulatory Visit: Payer: Self-pay

## 2020-02-13 ENCOUNTER — Ambulatory Visit (INDEPENDENT_AMBULATORY_CARE_PROVIDER_SITE_OTHER): Payer: Self-pay | Admitting: Podiatry

## 2020-02-13 DIAGNOSIS — G5761 Lesion of plantar nerve, right lower limb: Secondary | ICD-10-CM

## 2020-02-13 NOTE — Progress Notes (Signed)
  Subjective:  Patient ID: Donna [name], female    DOB: 10-Mar-1971,  MRN: [id number]  Chief Complaint  Patient presents with  . Follow-up     4wk F/U appt for Neuroma. Overall pt is still in pain. Started that the injection helped for 2-3 days ago.    49 y.o. female presents with the above complaint. History confirmed with patient.   Objective:  Physical Exam: warm, good capillary refill, no trophic changes or ulcerative lesions, normal DP and PT pulses and normal sensory exam.  Right Foot: POP R 3rd interspace   Assessment:   1. Morton neuroma, right    Plan:  Patient was evaluated and treated and all questions answered.  Morton Neuroma  -Repeat injection as below. Second injection.  Procedure: Neuroma Injection Location: Right 3rd interspace Skin Prep: Alcohol. Injectate: 0.5 cc 0.5% marcaine plain, 0.5 cc dexamethasone phosphate. Disposition: Patient tolerated procedure well. Injection site dressed with a band-aid.  Return in about 4 weeks (around 03/12/2020) for Neuroma, Right.

## 2020-02-24 MED FILL — WARFARIN SODIUM 5 MG TABLET: 5 | 45 days supply | Qty: 90 | Fill #1

## 2020-02-25 ENCOUNTER — Other Ambulatory Visit: Payer: Self-pay

## 2020-02-25 ENCOUNTER — Ambulatory Visit: Payer: Medicaid Other | Attending: Family Medicine | Admitting: Pharmacist

## 2020-02-25 DIAGNOSIS — Z7901 Long term (current) use of anticoagulants: Secondary | ICD-10-CM | POA: Insufficient documentation

## 2020-02-25 LAB — POCT INR: INR: 1.9 — AB (ref 2.0–3.0)

## 2020-02-25 MED FILL — IBUPROFEN 800 MG TABLET: 800 | 5 days supply | Qty: 15 | Fill #0

## 2020-02-25 MED FILL — AMOXICILLIN 500 MG CAPSULE: 500 | 7 days supply | Qty: 21 | Fill #0

## 2020-02-26 ENCOUNTER — Ambulatory Visit: Payer: Medicaid Other | Admitting: Pharmacist

## 2020-03-09 ENCOUNTER — Ambulatory Visit (INDEPENDENT_AMBULATORY_CARE_PROVIDER_SITE_OTHER): Payer: Medicaid Other | Admitting: Podiatry

## 2020-03-09 ENCOUNTER — Other Ambulatory Visit: Payer: Self-pay

## 2020-03-09 DIAGNOSIS — Z5329 Procedure and treatment not carried out because of patient's decision for other reasons: Secondary | ICD-10-CM

## 2020-03-09 NOTE — Progress Notes (Signed)
No show for appt - insurance issue

## 2020-03-17 ENCOUNTER — Ambulatory Visit: Payer: Medicaid Other | Attending: Family Medicine | Admitting: Pharmacist

## 2020-03-17 ENCOUNTER — Other Ambulatory Visit: Payer: Self-pay

## 2020-03-17 DIAGNOSIS — Z7901 Long term (current) use of anticoagulants: Secondary | ICD-10-CM

## 2020-03-17 LAB — POCT INR: INR: 2 (ref 2.0–3.0)

## 2020-03-17 NOTE — Patient Instructions (Signed)
Description   Continue normal schedule of 1 tablet on Monday and Friday with 2 tabs all other days.

## 2020-04-14 ENCOUNTER — Other Ambulatory Visit: Payer: Self-pay

## 2020-04-14 ENCOUNTER — Ambulatory Visit: Payer: Self-pay | Attending: Family Medicine | Admitting: Pharmacist

## 2020-04-14 DIAGNOSIS — Z7901 Long term (current) use of anticoagulants: Secondary | ICD-10-CM

## 2020-04-14 LAB — POCT INR: INR: 1.9 — AB (ref 2.0–3.0)

## 2020-04-14 NOTE — Patient Instructions (Signed)
Description   Continue normal schedule of 1 tablet on Monday and Friday with 2 tabs all other days. Stay consistent with the amount of greens.

## 2020-04-19 ENCOUNTER — Other Ambulatory Visit: Payer: Self-pay | Admitting: Family Medicine

## 2020-04-19 MED FILL — WARFARIN SODIUM 5 MG TABLET: 5 | 45 days supply | Qty: 90 | Fill #0

## 2020-04-19 NOTE — Telephone Encounter (Signed)
Requested medication (s) are due for refill today: yes  Requested medication (s) are on the active medication list: yes  Last refill:  12/25/19  Future visit scheduled: no- coumadin clinic  Notes to clinic:  med not delegated to NT to RF   Requested Prescriptions  Pending Prescriptions Disp Refills   warfarin (COUMADIN) 5 MG tablet [Pharmacy Med Name: WARFARIN SODIUM 5 MG TABLET 5 Tablet] 90 tablet 1    Sig: TAKE 1 TO 2 TABLETS BY MOUTH DAILY AS DIRECTED BY THE COUMADIN CLINIC.      Hematology:  Anticoagulants - warfarin Failed - 04/19/2020  9:48 AM      Failed - This refill cannot be delegated      Failed - If the patient is managed by Coumadin Clinic - route to their Pool. If not, forward to the provider.      Failed - INR in normal range and within 30 days    INR  Date Value Ref Range Status  04/14/2020 1.9 (A) 2.0 - 3.0 Final  07/15/2019 1.0 0.8 - 1.2 Final    Comment:    (NOTE) INR goal varies based on device and disease states. Performed at [facility], [place] [address] [place], [place] [address]           Failed - Valid encounter within last 3 months    Recent Outpatient Visits           1 year ago Pure hypercholesterolemia   [place] Charlott Rakes, MD   2 years ago Personal history of venous thrombosis and embolism   [place], [place]ong, MD   2 years ago Screening for breast cancer   [place], Enobong, MD   2 years ago Personal history of venous thrombosis and embolism   [place], MD   5 years ago [place], NP

## 2020-04-26 ENCOUNTER — Inpatient Hospital Stay: Payer: Medicaid Other | Attending: Hematology

## 2020-05-14 ENCOUNTER — Ambulatory Visit: Payer: Medicaid Other | Admitting: Pharmacist

## 2020-06-14 MED FILL — WARFARIN SODIUM 5 MG TABLET: 5 | 45 days supply | Qty: 90 | Fill #1

## 2020-06-22 ENCOUNTER — Other Ambulatory Visit: Payer: Self-pay

## 2020-06-22 ENCOUNTER — Ambulatory Visit: Payer: Medicaid Other | Attending: Family Medicine | Admitting: Pharmacist

## 2020-06-22 DIAGNOSIS — Z7901 Long term (current) use of anticoagulants: Secondary | ICD-10-CM

## 2020-06-22 LAB — POCT INR: INR: 2.4 (ref 2.0–3.0)

## 2020-07-10 ENCOUNTER — Other Ambulatory Visit: Payer: Self-pay

## 2020-07-10 ENCOUNTER — Emergency Department (HOSPITAL_COMMUNITY)
Admission: EM | Admit: 2020-07-10 | Discharge: 2020-07-10 | Disposition: A | Discharge status: Home / Self Care | Payer: Medicaid Other | Attending: Emergency Medicine | Admitting: Emergency Medicine

## 2020-07-10 ENCOUNTER — Emergency Department (HOSPITAL_COMMUNITY): Payer: Medicaid Other

## 2020-07-10 DIAGNOSIS — Z7901 Long term (current) use of anticoagulants: Secondary | ICD-10-CM | POA: Insufficient documentation

## 2020-07-10 DIAGNOSIS — R000 Tachycardia, unspecified: Secondary | ICD-10-CM | POA: Insufficient documentation

## 2020-07-10 DIAGNOSIS — W01198A Fall on same level from slipping, tripping and stumbling with subsequent striking against other object, initial encounter: Secondary | ICD-10-CM | POA: Insufficient documentation

## 2020-07-10 DIAGNOSIS — S0003XA Contusion of scalp, initial encounter: Secondary | ICD-10-CM

## 2020-07-10 DIAGNOSIS — Y92009 Unspecified place in unspecified non-institutional (private) residence as the place of occurrence of the external cause: Secondary | ICD-10-CM | POA: Insufficient documentation

## 2020-07-10 DIAGNOSIS — R079 Chest pain, unspecified: Secondary | ICD-10-CM | POA: Insufficient documentation

## 2020-07-10 DIAGNOSIS — F10920 Alcohol use, unspecified with intoxication, uncomplicated: Secondary | ICD-10-CM

## 2020-07-10 DIAGNOSIS — F329 Major depressive disorder, single episode, unspecified: Secondary | ICD-10-CM | POA: Insufficient documentation

## 2020-07-10 LAB — CBC WITH DIFFERENTIAL/PLATELET
Abs Immature Granulocytes: 0.03 10*3/uL (ref 0.00–0.07)
Basophils Absolute: 0.1 10*3/uL (ref 0.0–0.1)
Basophils Relative: 1 %
Eosinophils Absolute: 0.1 10*3/uL (ref 0.0–0.5)
Eosinophils Relative: 1 %
HCT: 42.7 % (ref 36.0–46.0)
Hemoglobin: 14.2 g/dL (ref 12.0–15.0)
Immature Granulocytes: 1 %
Lymphocytes Relative: 35 %
Lymphs Abs: 2 10*3/uL (ref 0.7–4.0)
MCH: 30.3 pg (ref 26.0–34.0)
MCHC: 33.3 g/dL (ref 30.0–36.0)
MCV: 91.2 fL (ref 80.0–100.0)
Monocytes Absolute: 0.5 10*3/uL (ref 0.1–1.0)
Monocytes Relative: 8 %
Neutro Abs: 3.1 10*3/uL (ref 1.7–7.7)
Neutrophils Relative %: 54 %
Platelets: 301 10*3/uL (ref 150–400)
RBC: 4.68 MIL/uL (ref 3.87–5.11)
RDW: 14.7 % (ref 11.5–15.5)
WBC: 5.8 10*3/uL (ref 4.0–10.5)
nRBC: 0 % (ref 0.0–0.2)

## 2020-07-10 LAB — PROTIME-INR
INR: 3.3 — ABNORMAL HIGH (ref 0.8–1.2)
Prothrombin Time: 32.3 seconds — ABNORMAL HIGH (ref 11.4–15.2)

## 2020-07-10 LAB — COMPREHENSIVE METABOLIC PANEL
ALT: 18 U/L (ref 0–44)
AST: 16 U/L (ref 15–41)
Albumin: 2.9 g/dL — ABNORMAL LOW (ref 3.5–5.0)
Alkaline Phosphatase: 60 U/L (ref 38–126)
Anion gap: 13 (ref 5–15)
BUN: 13 mg/dL (ref 6–20)
CO2: 18 mmol/L — ABNORMAL LOW (ref 22–32)
Calcium: 8.5 mg/dL — ABNORMAL LOW (ref 8.9–10.3)
Chloride: 108 mmol/L (ref 98–111)
Creatinine, Ser: 1.09 mg/dL — ABNORMAL HIGH (ref 0.44–1.00)
GFR, Estimated: >60 mL/min (ref >60)
Glucose, Bld: 111 mg/dL — ABNORMAL HIGH (ref 70–99)
Potassium: 3.5 mmol/L (ref 3.5–5.1)
Sodium: 139 mmol/L (ref 135–145)
Total Bilirubin: 0.5 mg/dL (ref 0.3–1.2)
Total Protein: 6.4 g/dL — ABNORMAL LOW (ref 6.5–8.1)

## 2020-07-10 LAB — ETHANOL: Alcohol, Ethyl (B): 168 mg/dL — ABNORMAL HIGH (ref <10)

## 2020-07-10 LAB — TROPONIN I (HIGH SENSITIVITY): Troponin I (High Sensitivity): 10 ng/L (ref <18)

## 2020-07-10 NOTE — ED Provider Notes (Signed)
[place] EMERGENCY DEPARTMENT Provider Note   CSN: [id number] Arrival date & time: 07/10/20  0115   History Chief Complaint  Patient presents with  . Fall    Donna [name] is a 49 y.o. female.  The history is provided by the patient.  Fall  She has history of hyperlipidemia, pulmonary embolism anticoagulated on warfarin and was brought in by ambulance after falling at home.  She hit her head on the wall and had loss of consciousness for an undetermined amount of time.  She denies visual change, nausea, vomiting.  She does admit to having been drinking heavily tonight.  She has been drinking heavily for the last month since the death of her daughter.  She has also been having some chest pain and shortness of breath during this time.  Past Medical History:  Diagnosis Date  . DVT (deep venous thrombosis) ([place]) 12/06/2012  . Lung collapse   . PONV (postoperative nausea and vomiting)   . Pulmonary embolism affecting pregnancy     Patient Active Problem List   Diagnosis Date Noted  . Hyperlipidemia 10/17/2017  . Hypercoagulable state ([place]) 09/11/2017  . Personal history of venous thrombosis and embolism 08/01/2015  . Venous thrombosis 08/12/2014  . 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 Iron deficiency anemia 07/16/2014  . Pleuritic chest pain 07/16/2014  . High risk medication use 02/12/2013  . Chronic anticoagulation 02/12/2013  . Healthcare maintenance 02/12/2013  . Obesity 12/30/2012  . Pulmonary embolism ([place]) 12/05/2012  . Anemia 12/05/2012    Past Surgical History:  Procedure Laterality Date  . HYSTERECTOMY ABDOMINAL WITH SALPINGECTOMY Bilateral 07/15/2019   Procedure: HYSTERECTOMY ABDOMINAL WITH SALPINGECTOMY;  Surgeon: [name], MD;  Location: [place];  Service: Gynecology;  Laterality: Bilateral;  . RIB RESECTION       OB History    Gravida  1   Para  1   Term  1   Preterm  0   AB  0   Living  1     SAB  0    IAB  0   Ectopic  0   Multiple  0   Live Births              Family History  Problem Relation Age of Onset  . COPD Father   . Stroke Father     Social History   Tobacco Use  . Smoking status: Never Smoker  . Smokeless tobacco: Never Used  Vaping Use  . Vaping Use: Never used  Substance Use Topics  . Alcohol use: Yes    Comment: socially   . Drug use: No    Home Medications Prior to Admission medications   Medication Sig Start Date End Date Taking? Authorizing Provider  acetaminophen (TYLENOL) 500 MG tablet Take 1,000 mg by mouth every 6 (six) hours as needed.    [provider]  atorvastatin (LIPITOR) 20 MG tablet Take 1 tablet (20 mg total) by mouth daily. 12/25/19   Charlott Rakes, MD  ferrous sulfate 325 (65 FE) MG tablet One tablet orally twice daily with a meal 01/12/16   Truitt Merle, MD  gabapentin (NEURONTIN) 300 MG capsule Take 1 capsule (300 mg total) by mouth at bedtime. 08/19/19   Raylene Everts, MD  warfarin (COUMADIN) 5 MG tablet TAKE 1 TO 2 TABLETS BY MOUTH DAILY AS DIRECTED BY THE COUMADIN CLINIC. 04/19/20   Charlott Rakes, MD  enoxaparin (LOVENOX) 120 MG/0.8ML injection Inject 0.86 mLs (  130 mg total) into the skin every 12 (twelve) hours. 07/09/19 08/19/19  Charlott Rakes, MD    Allergies    Nsaids  Review of Systems   Review of Systems  All other systems reviewed and are negative.   Physical Exam Updated Vital Signs BP (!) 145/83 (BP Location: Right Arm)   Pulse (!) 107   Temp 98.3 F (36.8 C) (Oral)   Resp 12   Ht 5\' 10"  (1.778 m)   Wt 126 kg   LMP 07/05/2019 (Exact Date)   SpO2 99%   BMI 39.86 kg/m   Physical Exam Vitals and nursing note reviewed.   49 year old female, resting comfortably and in no acute distress. Vital signs are significant for borderline elevated blood pressure, mildly elevated heart rate. Oxygen saturation is 99%, which is normal. Head is normocephalic and atraumatic. PERRLA, EOMI. Oropharynx is  clear. Neck is nontender without adenopathy or JVD. Back is nontender and there is no CVA tenderness. Lungs are clear without rales, wheezes, or rhonchi. Chest is nontender. Heart has regular rate and rhythm without murmur. Abdomen is soft, flat, nontender without masses or hepatosplenomegaly and peristalsis is normoactive. Extremities have no cyanosis or edema, full range of motion is present. Skin is warm and dry without rash. Neurologic: Mental status is normal, cranial nerves are intact, there are no motor or sensory deficits.  ED Results / Procedures / Treatments   Labs (all labs ordered are listed, but only abnormal results are displayed) Labs Reviewed  COMPREHENSIVE METABOLIC PANEL - Abnormal; Notable for the following components:      Result Value   CO2 18 (*)    Glucose, Bld 111 (*)    Creatinine, Ser 1.09 (*)    Calcium 8.5 (*)    Total Protein 6.4 (*)    Albumin 2.9 (*)    All other components within normal limits  ETHANOL - Abnormal; Notable for the following components:   Alcohol, Ethyl (B) 168 (*)    All other components within normal limits  PROTIME-INR - Abnormal; Notable for the following components:   Prothrombin Time 32.3 (*)    INR 3.3 (*)    All other components within normal limits  CBC WITH DIFFERENTIAL/PLATELET  TROPONIN I (HIGH SENSITIVITY)    EKG EKG Interpretation  Date/Time:  Saturday July 10 2020 01:20:49 EST Ventricular Rate:  106 PR Interval:    QRS Duration: 89 QT Interval:  345 QTC Calculation: 459 R Axis:   85 Text Interpretation: Sinus tachycardia Borderline T wave abnormalities When compared with ECG of 07/09/2019, No significant change was found Confirmed by Delora Fuel [id number]) on 07/10/2020 1:22:22 AM   Radiology CT Head Wo Contrast  Result Date: 07/10/2020 CLINICAL DATA:  Status post fall. EXAM: CT HEAD WITHOUT CONTRAST TECHNIQUE: Contiguous axial images were obtained from the base of the skull through the vertex without  intravenous contrast. COMPARISON:  May 05, 2004 FINDINGS: Brain: No evidence of acute infarction, hemorrhage, hydrocephalus, extra-axial collection or mass lesion/mass effect. Vascular: No hyperdense vessel or unexpected calcification. Skull: Normal. Negative for fracture or focal lesion. Sinuses/Orbits: There is a 2.4 cm x 1.3 cm left maxillary sinus polyp versus mucous retention cyst. A similar appearing 1.1 cm x 0.5 cm right maxillary sinus polyp versus mucous retention cyst is noted. Other: None. IMPRESSION: No acute intracranial abnormality. Electronically Signed   By: Virgina Norfolk M.D.   On: 07/10/2020 02:05   CT Cervical Spine Wo Contrast  Result Date: 07/10/2020 CLINICAL DATA:  Status post fall. EXAM: CT CERVICAL SPINE WITHOUT CONTRAST TECHNIQUE: Multidetector CT imaging of the cervical spine was performed without intravenous contrast. Multiplanar CT image reconstructions were also generated. COMPARISON:  None. FINDINGS: Alignment: Normal. Skull base and vertebrae: No acute fracture. No primary bone lesion or focal pathologic process. Soft tissues and spinal canal: No prevertebral fluid or swelling. No visible canal hematoma. Disc levels: Marked severity anterior osteophyte formation is seen at the level of C5-C6. Mild intervertebral disc space narrowing is seen at the level of C5-C6 with normal intervertebral disc spaces are seen throughout the remainder of the cervical spine. Normal bilateral multilevel facet joints are noted. Upper chest: Negative. Other: None. IMPRESSION: 1. Degenerative changes at the level of C5-C6. 2. No evidence of an acute fracture or subluxation. Electronically Signed   By: Virgina Norfolk M.D.   On: 07/10/2020 02:07    Procedures Procedures   Medications Ordered in ED Medications - No data to display  ED Course  I have reviewed the triage vital signs and the nursing notes.  Pertinent labs & imaging results that were available during my care of the  patient were reviewed by me and considered in my medical decision making (see chart for details).  MDM Rules/Calculators/A&P Fall with head injury and patient who is chronically anticoagulated, will need to send for CT of head.  Chest pain in setting of emotional loss.  ECG shows no acute changes, will check troponin as patient would be at risk for Takotsubo's cardiomyopathy.  Old records are reviewed, and she has no relevant past visits.  Labs are significant for ethanol level in the range of intoxication, supratherapeutic INR.  CT scans showed no acute injury to head or cervical spine.  Troponin is normal.  Patient is advised of these findings, given resource guide for outpatient mental health services.  Final Clinical Impression(s) / ED Diagnoses Final diagnoses:  Fall at home, initial encounter  Contusion of scalp, initial encounter  Alcohol intoxication, uncomplicated ([place])  Nonspecific chest pain  Anticoagulated on warfarin  Reactive depression    Rx / DC Orders ED Discharge Orders    None       Delora Fuel, MD 88/41/66 [phone]

## 2020-07-10 NOTE — ED Notes (Signed)
ED Provider at bedside. 

## 2020-07-10 NOTE — ED Triage Notes (Signed)
Brought in by [facility] from home, c/o fall - had syncopal episode and fell backwards and hit her head. Pt on coumadin. Pt [id number]. Able to move all extremities.

## 2020-07-10 NOTE — ED Notes (Signed)
Back to room at this time.

## 2020-07-10 NOTE — Discharge Instructions (Addendum)
Please follow through with getting counseling at one of the centers in the resource guide given to you today.

## 2020-07-10 NOTE — ED Notes (Signed)
Patient transported to CT 

## 2020-07-22 DIAGNOSIS — Z03818 Encounter for observation for suspected exposure to other biological agents ruled out: Secondary | ICD-10-CM | POA: Diagnosis not present

## 2020-07-22 DIAGNOSIS — Z20822 Contact with and (suspected) exposure to covid-19: Secondary | ICD-10-CM | POA: Diagnosis not present

## 2020-07-30 ENCOUNTER — Other Ambulatory Visit: Payer: Self-pay | Admitting: Family Medicine

## 2020-07-30 MED FILL — WARFARIN SODIUM 5 MG TABLET: 5 | 45 days supply | Qty: 90 | Fill #0

## 2020-07-30 NOTE — Telephone Encounter (Signed)
Requested medication (s) are due for refill today - yes  Requested medication (s) are on the active medication list -yes  Future visit scheduled -yes  Last refill: 06/14/20  Notes to clinic: Request RF non delegated rx  Requested Prescriptions  Pending Prescriptions Disp Refills   warfarin (COUMADIN) 5 MG tablet [Pharmacy Med Name: WARFARIN SODIUM 5 MG TABLET 5 Tablet] 90 tablet 1    Sig: TAKE 1 TO 2 TABLETS BY MOUTH DAILY AS DIRECTED BY THE COUMADIN CLINIC.      Hematology:  Anticoagulants - warfarin Failed - 07/30/2020  8:06 AM      Failed - This refill cannot be delegated      Failed - If the patient is managed by Coumadin Clinic - route to their Pool. If not, forward to the provider.      Failed - INR in normal range and within 30 days    INR  Date Value Ref Range Status  07/10/2020 3.3 (H) 0.8 - 1.2 Final    Comment:    (NOTE) INR goal varies based on device and disease states. Performed at [facility], [place] [address] [place], [place] [address]           Failed - Valid encounter within last 3 months    Recent Outpatient Visits           1 year ago Pure hypercholesterolemia   [facility] Charlott Rakes, MD   2 years ago Personal history of venous thrombosis and embolism   [place], Enobong, MD   2 years ago Screening for breast cancer   [place], Enobong, MD   2 years ago Personal history of venous thrombosis and embolism   [place], Enobong, MD   5 years ago [place], NP       Future Appointments             In 1 week Charlott Rakes, MD [place]                 Requested Prescriptions  Pending Prescriptions Disp Refills   warfarin (COUMADIN) 5 MG tablet [Pharmacy Med Name: WARFARIN SODIUM 5 MG  TABLET 5 Tablet] 90 tablet 1    Sig: TAKE 1 TO 2 TABLETS BY MOUTH DAILY AS DIRECTED BY THE COUMADIN CLINIC.      Hematology:  Anticoagulants - warfarin Failed - 07/30/2020  8:06 AM      Failed - This refill cannot be delegated      Failed - If the patient is managed by Coumadin Clinic - route to their Pool. If not, forward to the provider.      Failed - INR in normal range and within 30 days    INR  Date Value Ref Range Status  07/10/2020 3.3 (H) 0.8 - 1.2 Final    Comment:    (NOTE) INR goal varies based on device and disease states. Performed at [facility], [place] [address], [place],  [address]           Failed - Valid encounter within last 3 months    Recent Outpatient Visits           1 year ago Pure hypercholesterolemia   [facility] Charlott Rakes, MD   2  years ago Personal history of venous thrombosis and embolism   [place], Charlane Ferretti, MD   2 years ago Screening for breast cancer   [place], Enobong, MD   2 years ago Personal history of venous thrombosis and embolism   [place], Enobong, MD   5 years ago [place], NP       Future Appointments             In 1 week Charlott Rakes, MD [place]

## 2020-08-12 ENCOUNTER — Other Ambulatory Visit: Payer: Self-pay | Admitting: Family Medicine

## 2020-08-12 ENCOUNTER — Encounter: Payer: Self-pay | Admitting: Family Medicine

## 2020-08-12 ENCOUNTER — Other Ambulatory Visit: Payer: Self-pay

## 2020-08-12 ENCOUNTER — Ambulatory Visit: Payer: Self-pay | Attending: Family Medicine | Admitting: Family Medicine

## 2020-08-12 VITALS — BP 133/85 | HR 72 | Ht 70.0 in | Wt 297.0 lb

## 2020-08-12 DIAGNOSIS — G4709 Other insomnia: Secondary | ICD-10-CM

## 2020-08-12 DIAGNOSIS — Z7901 Long term (current) use of anticoagulants: Secondary | ICD-10-CM

## 2020-08-12 DIAGNOSIS — Z1211 Encounter for screening for malignant neoplasm of colon: Secondary | ICD-10-CM

## 2020-08-12 DIAGNOSIS — Z634 Disappearance and death of family member: Secondary | ICD-10-CM

## 2020-08-12 LAB — POCT INR: INR: 3.1 — AB (ref 2.0–3.0)

## 2020-08-12 MED ORDER — HYDROXYZINE HCL 25 MG PO TABS: 25.0000 mg | ORAL_TABLET | Freq: Three times a day (TID) | ORAL | 3 refills | Status: DC | PRN

## 2020-08-12 MED FILL — hydrOXYzine HCL 25 MG TABS: 25 | 20 days supply | Qty: 60 | Fill #0

## 2020-08-12 NOTE — Progress Notes (Signed)
Subjective:  Patient ID: Donna [name], female    DOB: 03-Feb-1971  Age: 50 y.o. MRN: [id number]  CC: No chief complaint on file.   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  She states she is taking it 1 day at a time.  As a result she has been less active and has gained weight.  She never attended grief counseling.  Past Medical History:  Diagnosis Date  . DVT (deep venous thrombosis) ([place]) 12/06/2012  . Lung collapse   . PONV (postoperative nausea and vomiting)   . Pulmonary embolism affecting pregnancy     Past Surgical History:  Procedure Laterality Date  . HYSTERECTOMY ABDOMINAL WITH SALPINGECTOMY Bilateral 07/15/2019   Procedure: HYSTERECTOMY ABDOMINAL WITH SALPINGECTOMY;  Surgeon: [name], MD;  Location: [facility];  Service: Gynecology;  Laterality: Bilateral;  . RIB RESECTION      Family History  Problem Relation Age of Onset  . COPD Father   . Stroke Father     Allergies  Allergen Reactions  . Nsaids Other (See Comments)    ON COUMADIN    Outpatient Medications Prior to Visit  Medication Sig Dispense Refill  . acetaminophen (TYLENOL) 500 MG tablet Take 1,000 mg by mouth every 6 (six) hours as needed for mild pain.    Marland Kitchen atorvastatin (LIPITOR) 20 MG tablet Take 1 tablet (20 mg total) by mouth daily. 30 tablet 6  . ferrous sulfate 325 (65 FE) MG tablet One tablet orally twice daily with a meal (Patient taking differently: Take 325 mg by mouth 2 (two) times daily with a meal.  One tablet orally twice daily with a meal) 60 tablet 2  . warfarin (COUMADIN) 5 MG tablet TAKE 1 TO 2 TABLETS BY MOUTH DAILY AS DIRECTED BY THE COUMADIN CLINIC. 90 tablet 1   No facility-administered medications prior to visit.     ROS Review of Systems  Constitutional: Negative for activity change, appetite change and fatigue.  HENT: Negative for congestion, sinus pressure and sore throat.   Eyes: Negative for visual disturbance.  Respiratory: Negative for cough, chest tightness, shortness of breath and wheezing.   Cardiovascular: Negative for chest pain and palpitations.  Gastrointestinal: Negative for abdominal distention, abdominal pain and constipation.  Endocrine: Negative for polydipsia.  Genitourinary: Negative for dysuria and frequency.  Musculoskeletal: Negative for arthralgias and back pain.  Skin: Negative for rash.  Neurological: Negative for tremors, light-headedness and numbness.  Hematological: Does not bruise/bleed easily.  Psychiatric/Behavioral: Positive for dysphoric mood. Negative for agitation and behavioral problems.    Objective:  BP 133/85   Pulse 72   Ht 5\' 10"  (1.778 m)   Wt 297 lb (134.7 kg)   LMP 07/05/2019 (Exact Date)   SpO2 100%   BMI 42.62 kg/m   BP/Weight 08/12/2020 07/10/2020 03/10/538  Systolic BP 767 341 937  Diastolic BP 85 79 69  Wt. (Lbs) 297 277.78 -  BMI 42.62 39.86 -      Physical Exam Constitutional:      Appearance: She is well-developed.  Neck:     Vascular: No JVD.  Cardiovascular:     Rate and Rhythm: Normal rate.     Heart sounds: Normal heart sounds. No murmur heard.   Pulmonary:     Effort: Pulmonary effort is normal.     Breath sounds: Normal breath sounds. No wheezing or rales.  Chest:     Chest wall: No tenderness.  Abdominal:     General: Bowel sounds are normal. There is no distension.     Palpations: Abdomen is soft. There is no mass.     Tenderness: There is no abdominal tenderness.   Musculoskeletal:        General: Normal range of motion.     Right lower leg: No edema.     Left lower leg: No edema.  Neurological:     Mental Status: She is alert and oriented to person, place, and time.  Psychiatric:     Comments: Dysphoric mood     CMP Latest Ref Rng & Units 07/10/2020 09/25/2019 07/15/2019  Glucose 70 - 99 mg/dL 111(H) 100(H) -  BUN 6 - 20 mg/dL 13 14 -  Creatinine 0.44 - 1.00 mg/dL 1.09(H) 0.85 1.00  Sodium 135 - 145 mmol/L 139 138 -  Potassium 3.5 - 5.1 mmol/L 3.5 3.8 -  Chloride 98 - 111 mmol/L 108 106 -  CO2 22 - 32 mmol/L 18(L) 24 -  Calcium 8.9 - 10.3 mg/dL 8.5(L) 8.1(L) -  Total Protein 6.5 - 8.1 g/dL 6.4(L) 6.7 -  Total Bilirubin 0.3 - 1.2 mg/dL 0.5 0.2(L) -  Alkaline Phos 38 - 126 U/L 60 79 -  AST 15 - 41 U/L 16 12(L) -  ALT 0 - 44 U/L 18 10 -    Lipid Panel     Component Value Date/Time   CHOL 216 (H) 03/18/2019 1609   TRIG 193 (H) 03/18/2019 1609   HDL 47 03/18/2019 1609   CHOLHDL 4.6 (H) 03/18/2019 1609   CHOLHDL 5.3 05/16/2013 0925   VLDL 36 05/16/2013 0925   LDLCALC 135 (H) 03/18/2019 1609    CBC    Component Value Date/Time   WBC 5.8 07/10/2020 0155   RBC 4.68 07/10/2020 0155   HGB 14.2 07/10/2020 0155   HGB 12.3 07/11/2017 1209   HCT 42.7 07/10/2020 0155   HCT 38.5 07/11/2017 1209   PLT 301 07/10/2020 0155   PLT 249 07/11/2017 1209   MCV 91.2 07/10/2020 0155   MCV 84.2 07/11/2017 1209   MCH 30.3 07/10/2020 0155   MCHC 33.3 07/10/2020 0155   RDW 14.7 07/10/2020 0155   RDW 16.7 (H) 07/11/2017 1209   LYMPHSABS 2.0 07/10/2020 0155   LYMPHSABS 2.0 07/11/2017 1209   MONOABS 0.5 07/10/2020 0155   MONOABS 0.4 07/11/2017 1209   EOSABS 0.1 07/10/2020 0155   EOSABS 0.1 07/11/2017 1209   BASOSABS 0.1 07/10/2020 0155   BASOSABS 0.0 07/11/2017 1209    No results found for: HGBA1C  Assessment & Plan:  1. 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  Dispense: 60 tablet; Refill: 3  2. Bereavement She will benefit from grief counseling Placed on the LCSW schedule to assist with referral for grief support - hydrOXYzine (ATARAX/VISTARIL) 25 MG tablet; Take 1 tablet (25 mg total) by mouth 3 (three) times daily as needed.  Dispense: 60 tablet; Refill: 3  3. Screening for colon cancer - Fecal occult blood, imunochemical([facility])  4. Chronic anticoagulation Slightly  supratherapeutic with INR of 3.1 Seen by the clinical pharmacist today and medicine dose adjusted - INR    Meds ordered this encounter  Medications  . hydrOXYzine (ATARAX/VISTARIL) 25 MG tablet    Sig: Take 1 tablet (25 mg total) by mouth 3 (three) times daily as needed.    Dispense:  60 tablet    Refill:  3    Follow-up: Return for Bereavement, place on Jasmine's schedule; PCP 6 months.       Charlott Rakes, MD, FAAFP. [facility] [place], [place]   08/12/2020, 10:28 AM

## 2020-08-12 NOTE — Progress Notes (Signed)
Having trouble sleeping.

## 2020-08-12 NOTE — Patient Instructions (Addendum)
Managing Loss, Adult People experience loss in many different ways throughout their lives. Events such as moving, changing jobs, and losing friends can create a sense of loss. The loss may be as serious as a major health change, divorce, death of a pet, or death of a loved one. All of these types of loss are likely to create a physical and emotional reaction known as grief. Grief is the result of a major change or an absence of something or someone that you count on. Grief is a normal reaction to loss. A variety of factors can affect your grieving experience, including:  The nature of your loss.  Your relationship to what or whom you lost.  Your understanding of grief and how to manage it.  Your support system. How to manage lifestyle changes Keep to your normal routine as much as possible.  If you have trouble focusing or doing normal activities, it is acceptable to take some time away from your normal routine.  Spend time with friends and loved ones.  Eat a healthy diet, get plenty of sleep, and rest when you feel tired.   How to recognize changes  The way that you deal with your grief will affect your ability to function as you normally do. When grieving, you may experience these changes:  Numbness, shock, sadness, anxiety, anger, denial, and guilt.  Thoughts about death.  Unexpected crying.  A physical sensation of emptiness in your stomach.  Problems sleeping and eating.  Tiredness (fatigue).  Loss of interest in normal activities.  Dreaming about or imagining seeing the person who died.  A need to remember what or whom you lost.  Difficulty thinking about anything other than your loss for a period of time.  Relief. If you have been expecting the loss for a while, you may feel a sense of relief when it happens. Follow these instructions at home: Activity Express your feelings in healthy ways, such as:  Talking with others about your loss. It may be helpful to find  others who have had a similar loss, such as a support group.  Writing down your feelings in a journal.  Doing physical activities to release stress and emotional energy.  Doing creative activities like painting, sculpting, or playing or listening to music.  Practicing resilience. This is the ability to recover and adjust after facing challenges. Reading some resources that encourage resilience may help you to learn ways to practice those behaviors.   General instructions  Be patient with yourself and others. Allow the grieving process to happen, and remember that grieving takes time. ? It is likely that you may never feel completely done with some grief. You may find a way to move on while still cherishing memories and feelings about your loss. ? Accepting your loss is a process. It can take months or longer to adjust.  Keep all follow-up visits as told by your health care provider. This is important. Where to find support To get support for managing loss:  Ask your health care provider for help and recommendations, such as grief counseling or therapy.  Think about joining a support group for people who are managing a loss. Where to find more information You can find more information about managing loss from:  American Society of Clinical Oncology: www.cancer.net  American Psychological Association: www.apa.org Contact a health care provider if:  Your grief is extreme and keeps getting worse.  You have ongoing grief that does not improve.  Your body shows symptoms   of grief, such as illness.  You feel depressed, anxious, or lonely. Get help right away if:  You have thoughts about hurting yourself or others. If you ever feel like you may hurt yourself or others, or have thoughts about taking your own life, get help right away. You can go to your nearest emergency department or call:  Your local emergency services (911 in the [place]  A suicide crisis helpline, such as the  [place] at 726 124 8808. This is open 24 hours a day. Summary  Grief is the result of a major change or an absence of someone or something that you count on. Grief is a normal reaction to loss.  The depth of grief and the period of recovery depend on the type of loss and your ability to adjust to the change and process your feelings.  Processing grief requires patience and a willingness to accept your feelings and talk about your loss with people who are supportive.  It is important to find resources that work for you and to realize that people experience grief differently. There is not one grieving process that works for everyone in the same way.  Be aware that when grief becomes extreme, it can lead to more severe issues like isolation, depression, anxiety, or suicidal thoughts. Talk with your health care provider if you have any of these issues. This information is not intended to replace advice given to you by your health care provider. Make sure you discuss any questions you have with your health care provider. Document Revised: 12/25/2019 Document Reviewed: 12/25/2019 Elsevier Patient Education  2021 [place]: please take only 1 tablet today. Then, continue same regimen of 2 tablets on all days except for 1 tablet on Mondays and Fridays. Return for an repeat check in 2 weeks.

## 2020-08-24 MED FILL — hydrOXYzine HCL 25 MG TABS: 25 | 20 days supply | Qty: 60 | Fill #0

## 2020-08-27 NOTE — Progress Notes (Signed)
[facility]   Telephone:([phone]) [phone] [phone]) [phone]   Clinic Follow up Note   Patient Care Team: Charlott Rakes, MD as PCP - General (Family Medicine)  Date of Service:  08/30/2020  CHIEF COMPLAINT: F/u recurrent DVT/ PE, iron deficient anemia  CURRENT THERAPY:  Coumadin, oral iron pills, IV feraheme as needed  INTERVAL HISTORY:  Donna [name] is here for a follow up of DVT/PR and IDA. She was last seen by me 8 months ago. She presents to the clinic alone. She notes she is doing fairly well. She notes fatigue with activities, but denies chest pain. She notes she is eating the same. She notes she takes oral iron, but not daily.     REVIEW OF SYSTEMS:   Constitutional: Denies fevers, chills or abnormal weight loss Eyes: Denies blurriness of vision Ears, nose, mouth, throat, and face: Denies mucositis or sore throat Respiratory: Denies cough, dyspnea or wheezes Cardiovascular: Denies palpitation, chest discomfort or lower extremity swelling Gastrointestinal:  Denies nausea, heartburn or change in bowel habits Skin: Denies abnormal skin rashes Lymphatics: Denies new lymphadenopathy or easy bruising Neurological:Denies numbness, tingling or new weaknesses Behavioral/Psych: Mood is stable, no new changes  All other systems were reviewed with the patient and are negative.  MEDICAL HISTORY:  Past Medical History:  Diagnosis Date  . DVT (deep venous thrombosis) ([place]) 12/06/2012  . Lung collapse   . PONV (postoperative nausea and vomiting)   . Pulmonary embolism affecting pregnancy     SURGICAL HISTORY: Past Surgical History:  Procedure Laterality Date  . HYSTERECTOMY ABDOMINAL WITH SALPINGECTOMY Bilateral 07/15/2019   Procedure: HYSTERECTOMY ABDOMINAL WITH SALPINGECTOMY;  Surgeon: [name], MD;  Location: [facility];  Service: Gynecology;  Laterality: Bilateral;  . RIB RESECTION      I have reviewed the social history and family history with the  patient and they are unchanged from previous note.  ALLERGIES:  is allergic to nsaids.  MEDICATIONS:  Current Outpatient Medications  Medication Sig Dispense Refill  . acetaminophen (TYLENOL) 500 MG tablet Take 1,000 mg by mouth every 6 (six) hours as needed for mild pain.    Marland Kitchen atorvastatin (LIPITOR) 20 MG tablet Take 1 tablet (20 mg total) by mouth daily. 30 tablet 6  . ferrous sulfate 325 (65 FE) MG tablet One tablet orally twice daily with a meal (Patient taking differently: Take 325 mg by mouth 2 (two) times daily with a meal. One tablet orally twice daily with a meal) 60 tablet 2  . hydrOXYzine (ATARAX/VISTARIL) 25 MG tablet Take 1 tablet (25 mg total) by mouth 3 (three) times daily as needed. 60 tablet 3  . warfarin (COUMADIN) 5 MG tablet TAKE 1 TO 2 TABLETS BY MOUTH DAILY AS DIRECTED BY THE COUMADIN CLINIC. 90 tablet 1   No current facility-administered medications for this visit.    PHYSICAL EXAMINATION: ECOG PERFORMANCE STATUS: 1 - Symptomatic but completely ambulatory  Vitals:   08/30/20 1422  BP: 128/85  Pulse: 79  Resp: 19  Temp: 98 F (36.7 C)  SpO2: 100%   Filed Weights   08/30/20 1422  Weight: (!) 303 lb 4.8 oz (137.6 kg)    Due to COVID19 we will limit examination to appearance. Patient had no complaints.  GENERAL:alert, no distress and comfortable SKIN: skin color normal, no rashes or significant lesions EYES: normal, Conjunctiva are pink and non-injected, sclera clear  NEURO: alert & oriented x 3 with fluent speech   LABORATORY DATA:  I have reviewed  the data as listed CBC Latest Ref Rng & Units 08/30/2020 07/10/2020 12/24/2019  WBC 4.0 - 10.5 K/uL 5.5 5.8 4.7  Hemoglobin 12.0 - 15.0 g/dL 14.0 14.2 14.1  Hematocrit 36.0 - 46.0 % 41.9 42.7 44.2  Platelets 150 - 400 K/uL 264 301 293     CMP Latest Ref Rng & Units 08/30/2020 07/10/2020 09/25/2019  Glucose 70 - 99 mg/dL 97 111(H) 100(H)  BUN 6 - 20 mg/dL 10 13 14   Creatinine 0.44 - 1.00 mg/dL 0.79 1.09(H)  0.85  Sodium 135 - 145 mmol/L 137 139 138  Potassium 3.5 - 5.1 mmol/L 3.8 3.5 3.8  Chloride 98 - 111 mmol/L 109 108 106  CO2 22 - 32 mmol/L 23 18(L) 24  Calcium 8.9 - 10.3 mg/dL 8.0(L) 8.5(L) 8.1(L)  Total Protein 6.5 - 8.1 g/dL 6.1(L) 6.4(L) 6.7  Total Bilirubin 0.3 - 1.2 mg/dL 0.3 0.5 0.2(L)  Alkaline Phos 38 - 126 U/L 61 60 79  AST 15 - 41 U/L 11(L) 16 12(L)  ALT 0 - 44 U/L 14 18 10       RADIOGRAPHIC STUDIES: I have personally reviewed the radiological images as listed and agreed with the findings in the report. No results found.   ASSESSMENT & PLAN:  Donna [name] is a 49 y.o. female with   1. Anemia of iron deficiency, likely secondary to menorrhagia -She had fairly low ferritin and iron level, all consistent with iron deficiency. -She previously had good response to oral iron supplement in the past, but she is not very complaint, and has been receiving IV feraheme every 2-3 months. She responded well, anemia resolved afterwards. -She had IV Feraheme if ferritin < 50, Last on 09/12/19 -She underwent Hysterectomy due to fibroids on 07/15/19. This should help reduce her menorrhagia and improve her anemia.  -Labs reviewed, CBC and CMP WNL except Ca 8, protein 6.61m albumin 2.8. Iron panel normal. Ferritin still pending .She does still have fatigue, but this is not related to her anemia, given it has resolved.  -I discussed given resolved anemia after her hysterectomy she is fine to monitor her labs with her PCP. She is agreeable. If she remains stable, she may be able to stop oral iron.  -She can f/u with me as needed in the future.    2. Recurrent DVT/PE, (+) lupus anticoagulant -She had provoked DVT and PE in 2014, and second episode of unprovoked PE in December 2015 after she came off Coumadin. -Her PTT Lupus anticoagulant was positive on 12/05/2012 and decreased Protein C levels. Her repeated lupus anticoagulant is negative without detection of a lupus anticoagulant.  Hypercoagulopathy workup was negative when she was off Coumadin. -Ipreviouslyrecommendedanticoagulation for life long if there is no contraindication such as severe bleeding. She could not tolerate Xarelto.  -She is tolerating Coumadin well,she will continue monthly coumadin labs with her PCP office.  3. Obesity, Fatigue  -Continued dieting and exercises per her PCP. -Although her anemia and iron deficiency is resolved she still get fatigue. I discussed f/u with PCP to evaluate.    Plan: -F/u as needed in the future.  -she will continue lab monitoring for CBC, iron study and PT/INR at her PCP office    No problem-specific Assessment & Plan notes found for this encounter.   No orders of the defined types were placed in this encounter.  All questions were answered. The patient knows to call the clinic with any problems, questions or concerns. No barriers to learning was detected. The  total time spent in the appointment was 20 minutes.     Truitt Merle, MD 08/30/2020   I, Joslyn Devon, am acting as scribe for Truitt Merle, MD.   I have reviewed the above documentation for accuracy and completeness, and I agree with the above.

## 2020-08-30 ENCOUNTER — Inpatient Hospital Stay: Payer: Medicaid Other

## 2020-08-30 ENCOUNTER — Other Ambulatory Visit: Payer: Self-pay

## 2020-08-30 ENCOUNTER — Inpatient Hospital Stay: Payer: Medicaid Other | Attending: Hematology | Admitting: Hematology

## 2020-08-30 ENCOUNTER — Encounter: Payer: Self-pay | Admitting: Hematology

## 2020-08-30 VITALS — BP 128/85 | HR 79 | Temp 98.0°F | Resp 19 | Ht 70.0 in | Wt 303.3 lb

## 2020-08-30 DIAGNOSIS — R5383 Other fatigue: Secondary | ICD-10-CM | POA: Insufficient documentation

## 2020-08-30 DIAGNOSIS — Z7901 Long term (current) use of anticoagulants: Secondary | ICD-10-CM | POA: Insufficient documentation

## 2020-08-30 DIAGNOSIS — D509 Iron deficiency anemia, unspecified: Secondary | ICD-10-CM

## 2020-08-30 DIAGNOSIS — N920 Excessive and frequent menstruation with regular cycle: Secondary | ICD-10-CM | POA: Insufficient documentation

## 2020-08-30 DIAGNOSIS — Z90710 Acquired absence of both cervix and uterus: Secondary | ICD-10-CM | POA: Insufficient documentation

## 2020-08-30 DIAGNOSIS — E669 Obesity, unspecified: Secondary | ICD-10-CM | POA: Insufficient documentation

## 2020-08-30 DIAGNOSIS — Z862 Personal history of diseases of the blood and blood-forming organs and certain disorders involving the immune mechanism: Secondary | ICD-10-CM | POA: Insufficient documentation

## 2020-08-30 DIAGNOSIS — Z86718 Personal history of other venous thrombosis and embolism: Secondary | ICD-10-CM | POA: Insufficient documentation

## 2020-08-30 LAB — COMPREHENSIVE METABOLIC PANEL
ALT: 14 U/L (ref 0–44)
AST: 11 U/L — ABNORMAL LOW (ref 15–41)
Albumin: 2.8 g/dL — ABNORMAL LOW (ref 3.5–5.0)
Alkaline Phosphatase: 61 U/L (ref 38–126)
Anion gap: 5 (ref 5–15)
BUN: 10 mg/dL (ref 6–20)
CO2: 23 mmol/L (ref 22–32)
Calcium: 8 mg/dL — ABNORMAL LOW (ref 8.9–10.3)
Chloride: 109 mmol/L (ref 98–111)
Creatinine, Ser: 0.79 mg/dL (ref 0.44–1.00)
GFR, Estimated: >60 mL/min (ref >60)
Glucose, Bld: 97 mg/dL (ref 70–99)
Potassium: 3.8 mmol/L (ref 3.5–5.1)
Sodium: 137 mmol/L (ref 135–145)
Total Bilirubin: 0.3 mg/dL (ref 0.3–1.2)
Total Protein: 6.1 g/dL — ABNORMAL LOW (ref 6.5–8.1)

## 2020-08-30 LAB — CBC WITH DIFFERENTIAL/PLATELET
Abs Immature Granulocytes: 0.03 10*3/uL (ref 0.00–0.07)
Basophils Absolute: 0 10*3/uL (ref 0.0–0.1)
Basophils Relative: 1 %
Eosinophils Absolute: 0.1 10*3/uL (ref 0.0–0.5)
Eosinophils Relative: 2 %
HCT: 41.9 % (ref 36.0–46.0)
Hemoglobin: 14 g/dL (ref 12.0–15.0)
Immature Granulocytes: 1 %
Lymphocytes Relative: 37 %
Lymphs Abs: 2 10*3/uL (ref 0.7–4.0)
MCH: 30 pg (ref 26.0–34.0)
MCHC: 33.4 g/dL (ref 30.0–36.0)
MCV: 89.9 fL (ref 80.0–100.0)
Monocytes Absolute: 0.5 10*3/uL (ref 0.1–1.0)
Monocytes Relative: 9 %
Neutro Abs: 2.8 10*3/uL (ref 1.7–7.7)
Neutrophils Relative %: 50 %
Platelets: 264 10*3/uL (ref 150–400)
RBC: 4.66 MIL/uL (ref 3.87–5.11)
RDW: 14.8 % (ref 11.5–15.5)
WBC: 5.5 10*3/uL (ref 4.0–10.5)
nRBC: 0 % (ref 0.0–0.2)

## 2020-08-30 LAB — IRON AND TIBC
Iron: 87 ug/dL (ref 41–142)
Saturation Ratios: 29 % (ref 21–57)
TIBC: 299 ug/dL (ref 236–444)
UIBC: 212 ug/dL (ref 120–384)

## 2020-08-30 LAB — FERRITIN: Ferritin: 39 ng/mL (ref 11–307)

## 2020-08-31 ENCOUNTER — Telehealth: Payer: Self-pay | Admitting: Hematology

## 2020-08-31 NOTE — Telephone Encounter (Signed)
Checked out appointment. No LOS notes to schedule. No changes made. 

## 2020-09-21 MED FILL — WARFARIN SODIUM 5 MG TABLET: 5 | 45 days supply | Qty: 90 | Fill #1

## 2020-11-01 ENCOUNTER — Ambulatory Visit: Payer: Self-pay | Attending: Family Medicine | Admitting: Pharmacist

## 2020-11-01 ENCOUNTER — Other Ambulatory Visit: Payer: Self-pay

## 2020-11-01 DIAGNOSIS — Z7901 Long term (current) use of anticoagulants: Secondary | ICD-10-CM

## 2020-11-01 NOTE — Progress Notes (Signed)
    Pharmacy Anticoagulation Clinic  Subjective: Patient presents today for INR monitoring. Anticoagulation indication is hx of recurrent VTE. She could not tolerate Xarelto. She has been followed by Dr. Burr Medico in the past.   Current dose of warfarin: 60 mg weekly   Adherence to warfarin: confirms with no missed doses in the past month or so Signs/symptoms of bleeding: denies  Recent changes in diet: denies  Recent changes in medications: denies  Upcoming procedures that may impact anticoagulation: none    Objective: Today's INR = no strips. The strips that were ordered for my [facility]agu[facility] XS machine were expired. Will sent a PT/INR through our lab.   Lab Results  Component Value Date   INR 3.1 (A) 08/12/2020   INR 3.3 (H) 07/10/2020   INR 2.4 06/22/2020     Assessment and Plan: Anticoagulation: Patient was slightly Supratherapeutic when last checked (on 08/12/2020). I cannot get a POCT result d/t lack of strips. Will get an INR/PT through our lab here. Will call patient tomorrow to recommend any modifications based on patient's INR result. Will also recommend appropriate follow-up at that time.   Total time face-to-face counseling: 15 minutes.  Follow-up: to be determined based on INR result   Benard Halsted, PharmD, [place], [place] [phone]

## 2020-11-02 ENCOUNTER — Telehealth: Payer: Self-pay | Admitting: Pharmacist

## 2020-11-02 LAB — PROTIME-INR
INR: 2.3 — ABNORMAL HIGH (ref 0.9–1.2)
Prothrombin Time: 23.2 s — ABNORMAL HIGH (ref 9.1–12.0)

## 2020-11-02 NOTE — Telephone Encounter (Signed)
Call placed to patient. Her INR result from yesterday had to be taken as a lab draw as we were out of test strips for our POC machine. Her INR is therapeutic at 2.3. Therefore, she has been advised to continue her current Coumadin dose and follow-up with me in 1 month.

## 2020-11-11 ENCOUNTER — Other Ambulatory Visit: Payer: Self-pay | Admitting: Family Medicine

## 2020-11-12 ENCOUNTER — Other Ambulatory Visit: Payer: Self-pay

## 2020-11-12 MED ORDER — WARFARIN SODIUM 5 MG PO TABS
ORAL_TABLET | ORAL | 1 refills | Status: DC
  Filled 2020-11-12: qty 90, 30d supply, fill #0
  Filled 2020-11-16: qty 60, 30d supply, fill #0
  Filled 2020-12-16: qty 60, 30d supply, fill #1
  Filled 2021-01-20: qty 60, 30d supply, fill #2

## 2020-11-12 NOTE — Telephone Encounter (Signed)
Requested medication (s) are on the active medication list Yes  See pharmacy note from 11/02/20  Note to clinic-Medication is not delegated for [facility] to refill.    Requested Prescriptions  Pending Prescriptions Disp Refills   warfarin (COUMADIN) 5 MG tablet 90 tablet 1    Sig: TAKE 1 TO 2 TABLETS BY MOUTH DAILY AS DIRECTED BY THE COUMADIN CLINIC.      Hematology:  Anticoagulants - warfarin Failed - 11/11/2020 11:51 PM      Failed - This refill cannot be delegated      Failed - If the patient is managed by Coumadin Clinic - route to their Pool. If not, forward to the provider.      Failed - INR in normal range and within 30 days    INR  Date Value Ref Range Status  11/01/2020 2.3 (H) 0.9 - 1.2 Final    Comment:    Reference interval is for non-anticoagulated patients. Suggested INR therapeutic range for Vitamin K antagonist therapy:    Standard Dose (moderate intensity                   therapeutic range):       2.0 - 3.0    Higher intensity therapeutic range       2.5 - 3.5           Passed - Valid encounter within last 3 months    Recent Outpatient Visits           1 week ago Chronic anticoagulation   [place], RPH-CPP   3 months ago Screening for colon cancer   [place] Charlott Rakes, MD   1 year ago Pure hypercholesterolemia   [place], Enobong, MD   2 years ago Personal history of venous thrombosis and embolism   [place], Enobong, MD   3 years ago Screening for breast cancer   [place] Charlott Rakes, MD

## 2020-11-16 ENCOUNTER — Other Ambulatory Visit: Payer: Self-pay

## 2020-11-30 NOTE — Progress Notes (Deleted)
Pt could not tolerate Xarelto --> skin thickening in the neck Been on warfarin since May 2014 provoked DVT HCC (positive Lupus factor that is resolved)  on 12/06/2012 Pulmonary Embolism 07/16/2014   Missed Doses? No missed doses  Extra Doses? No  New medications? No  Signs of bleeding?   Goal INR: 2 -3   60 mg weekly    Pt had to bridge when switching between DOAC --> warfarin  Discontinue rivaroxaban and start a parenteral anticoagulant with warfarin; continue the parenteral agent until the INR is therapeutic on warfarin (PI). Note that rivaroxaban can contribute to INR elevation.

## 2020-12-01 ENCOUNTER — Ambulatory Visit: Payer: Medicaid Other | Attending: Family Medicine | Admitting: Pharmacist

## 2020-12-01 DIAGNOSIS — Z7901 Long term (current) use of anticoagulants: Secondary | ICD-10-CM

## 2020-12-01 LAB — POCT INR: INR: 3 (ref 2.0–3.0)

## 2020-12-08 ENCOUNTER — Other Ambulatory Visit: Payer: Self-pay

## 2020-12-08 ENCOUNTER — Emergency Department (HOSPITAL_BASED_OUTPATIENT_CLINIC_OR_DEPARTMENT_OTHER)
Admission: EM | Admit: 2020-12-08 | Discharge: 2020-12-08 | Disposition: A | Discharge status: Home / Self Care | Payer: Medicaid Other | Attending: Emergency Medicine | Admitting: Emergency Medicine

## 2020-12-08 ENCOUNTER — Emergency Department (HOSPITAL_BASED_OUTPATIENT_CLINIC_OR_DEPARTMENT_OTHER): Payer: Medicaid Other

## 2020-12-08 ENCOUNTER — Encounter (HOSPITAL_BASED_OUTPATIENT_CLINIC_OR_DEPARTMENT_OTHER): Payer: Self-pay | Admitting: Urology

## 2020-12-08 DIAGNOSIS — R1030 Lower abdominal pain, unspecified: Secondary | ICD-10-CM | POA: Diagnosis present

## 2020-12-08 DIAGNOSIS — N83291 Other ovarian cyst, right side: Secondary | ICD-10-CM | POA: Diagnosis not present

## 2020-12-08 DIAGNOSIS — Z7901 Long term (current) use of anticoagulants: Secondary | ICD-10-CM | POA: Insufficient documentation

## 2020-12-08 DIAGNOSIS — R109 Unspecified abdominal pain: Secondary | ICD-10-CM | POA: Diagnosis not present

## 2020-12-08 DIAGNOSIS — R1031 Right lower quadrant pain: Secondary | ICD-10-CM

## 2020-12-08 DIAGNOSIS — K5730 Diverticulosis of large intestine without perforation or abscess without bleeding: Secondary | ICD-10-CM | POA: Diagnosis not present

## 2020-12-08 DIAGNOSIS — N83201 Unspecified ovarian cyst, right side: Secondary | ICD-10-CM | POA: Diagnosis not present

## 2020-12-08 LAB — URINALYSIS, MICROSCOPIC (REFLEX)

## 2020-12-08 LAB — BASIC METABOLIC PANEL
Anion gap: 4 — ABNORMAL LOW (ref 5–15)
BUN: 13 mg/dL (ref 6–20)
CO2: 24 mmol/L (ref 22–32)
Calcium: 7.7 mg/dL — ABNORMAL LOW (ref 8.9–10.3)
Chloride: 106 mmol/L (ref 98–111)
Creatinine, Ser: 0.94 mg/dL (ref 0.44–1.00)
GFR, Estimated: >60 mL/min (ref >60)
Glucose, Bld: 104 mg/dL — ABNORMAL HIGH (ref 70–99)
Potassium: 4 mmol/L (ref 3.5–5.1)
Sodium: 134 mmol/L — ABNORMAL LOW (ref 135–145)

## 2020-12-08 LAB — URINALYSIS, ROUTINE W REFLEX MICROSCOPIC
Bilirubin Urine: NEGATIVE
Glucose, UA: NEGATIVE mg/dL
Ketones, ur: NEGATIVE mg/dL
Leukocytes,Ua: NEGATIVE
Nitrite: NEGATIVE
Protein, ur: NEGATIVE mg/dL
Specific Gravity, Urine: 1.015 (ref 1.005–1.030)
pH: 6.5 (ref 5.0–8.0)

## 2020-12-08 LAB — CBC WITH DIFFERENTIAL/PLATELET
Abs Immature Granulocytes: 0.02 10*3/uL (ref 0.00–0.07)
Basophils Absolute: 0 10*3/uL (ref 0.0–0.1)
Basophils Relative: 1 %
Eosinophils Absolute: 0.1 10*3/uL (ref 0.0–0.5)
Eosinophils Relative: 3 %
HCT: 40.1 % (ref 36.0–46.0)
Hemoglobin: 13.3 g/dL (ref 12.0–15.0)
Immature Granulocytes: 0 %
Lymphocytes Relative: 36 %
Lymphs Abs: 2 10*3/uL (ref 0.7–4.0)
MCH: 30 pg (ref 26.0–34.0)
MCHC: 33.2 g/dL (ref 30.0–36.0)
MCV: 90.5 fL (ref 80.0–100.0)
Monocytes Absolute: 0.5 10*3/uL (ref 0.1–1.0)
Monocytes Relative: 8 %
Neutro Abs: 2.9 10*3/uL (ref 1.7–7.7)
Neutrophils Relative %: 52 %
Platelets: 240 10*3/uL (ref 150–400)
RBC: 4.43 MIL/uL (ref 3.87–5.11)
RDW: 13.8 % (ref 11.5–15.5)
WBC: 5.5 10*3/uL (ref 4.0–10.5)
nRBC: 0 % (ref 0.0–0.2)

## 2020-12-08 LAB — PROTIME-INR
INR: 2.1 — ABNORMAL HIGH (ref 0.8–1.2)
Prothrombin Time: 23.6 seconds — ABNORMAL HIGH (ref 11.4–15.2)

## 2020-12-08 NOTE — ED Notes (Signed)
Pt states h/o UTI when she drinks a lot of tea and has been drinking lots of tea lately.  States pain with urination in lower abdomen, states took tylenol last night with some relief. Minimal pain at this time

## 2020-12-08 NOTE — ED Notes (Signed)
Patient transported to CT 

## 2020-12-08 NOTE — ED Notes (Signed)
ED Provider at bedside. 

## 2020-12-08 NOTE — ED Triage Notes (Signed)
Reports Lower abdominal pain x 2 days with pain when having to urinate.  Denies any N/V/D

## 2020-12-08 NOTE — ED Notes (Signed)
Triage delayed d/t patient in restroom

## 2020-12-08 NOTE — ED Notes (Signed)
Patient transported to Ultrasound 

## 2020-12-08 NOTE — ED Provider Notes (Signed)
[facility] Provider Note   CSN: [id number] Arrival date & time: 12/08/20  4782     History Chief Complaint  Patient presents with  . Abdominal Pain    Donna [name] is a 50 y.o. female.  HPI Patient presents with abdominal pain.  Lower abdomen.  Some urinary symptoms.  States it just feels little strange to urinate.  No fevers.  Pain is in the lower abdomen.  Dull.  No nausea or vomiting.  No fevers or chills.  No diarrhea.  No bleeding.  No trauma.    Past Medical History:  Diagnosis Date  . DVT (deep venous thrombosis) ([place]) 12/06/2012  . Lung collapse   . PONV (postoperative nausea and vomiting)   . Pulmonary embolism affecting pregnancy     Patient Active Problem List   Diagnosis Date Noted  . Hyperlipidemia 10/17/2017  . Hypercoagulable state ([place]) 09/11/2017  . Personal history of venous thrombosis and embolism 08/01/2015  . Venous thrombosis 08/12/2014  . 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 Iron deficiency anemia 07/16/2014  . Pleuritic chest pain 07/16/2014  . High risk medication use 02/12/2013  . Chronic anticoagulation 02/12/2013  . Healthcare maintenance 02/12/2013  . Obesity 12/30/2012  . Pulmonary embolism ([place]) 12/05/2012  . Anemia 12/05/2012    Past Surgical History:  Procedure Laterality Date  . HYSTERECTOMY ABDOMINAL WITH SALPINGECTOMY Bilateral 07/15/2019   Procedure: HYSTERECTOMY ABDOMINAL WITH SALPINGECTOMY;  Surgeon: [name], MD;  Location: [facility];  Service: Gynecology;  Laterality: Bilateral;  . RIB RESECTION       OB History    Gravida  1   Para  1   Term  1   Preterm  0   AB  0   Living  1     SAB  0   IAB  0   Ectopic  0   Multiple  0   Live Births              Family History  Problem Relation Age of Onset  . COPD Father   . Stroke Father     Social History   Tobacco Use  . Smoking status: Never Smoker  . Smokeless tobacco: Never Used  Vaping Use   . Vaping Use: Never used  Substance Use Topics  . Alcohol use: Yes    Comment: socially   . Drug use: No    Home Medications Prior to Admission medications   Medication Sig Start Date End Date Taking? Authorizing Provider  acetaminophen (TYLENOL) 500 MG tablet Take 1,000 mg by mouth every 6 (six) hours as needed for mild pain.    [provider]  atorvastatin (LIPITOR) 20 MG tablet Take 1 tablet (20 mg total) by mouth daily. 12/25/19   Charlott Rakes, MD  ferrous sulfate 325 (65 FE) MG tablet One tablet orally twice daily with a meal Patient taking differently: Take 325 mg by mouth 2 (two) times daily with a meal. One tablet orally twice daily with a meal 01/12/16   Truitt Merle, MD  hydrOXYzine (ATARAX/VISTARIL) 25 MG tablet TAKE 1 TABLET (25 MG TOTAL) BY MOUTH 3 (THREE) TIMES DAILY AS NEEDED. 08/12/20 08/12/21  Charlott Rakes, MD  warfarin (COUMADIN) 5 MG tablet TAKE 1 TO 2 TABLETS BY MOUTH DAILY AS DIRECTED BY THE COUMADIN CLINIC. 11/12/20 11/12/21  Charlott Rakes, MD  enoxaparin (LOVENOX) 120 MG/0.8ML injection Inject 0.86 mLs (130 mg total) into the skin every 12 (twelve) hours. 07/09/19 08/19/19  Charlott Rakes, MD    Allergies    Nsaids  Review of Systems   Review of Systems  Constitutional: Negative for appetite change and fever.  HENT: Negative for congestion.   Respiratory: Negative for shortness of breath.   Gastrointestinal: Positive for abdominal pain.  Genitourinary: Positive for dysuria. Negative for flank pain.  Musculoskeletal: Negative for back pain.  Skin: Negative for rash.  Neurological: Negative for weakness.  Psychiatric/Behavioral: Negative for confusion.    Physical Exam Updated Vital Signs BP 121/78 (BP Location: Left Arm)   Pulse 73   Temp 98.4 F (36.9 C) (Oral)   Resp 18   Ht 5\' 10"  (1.778 m)   Wt 129.3 kg   LMP 07/05/2019 (Exact Date)   SpO2 100%   BMI 40.89 kg/m   Physical Exam Vitals and nursing note reviewed.  HENT:     Head:  Normocephalic.  Cardiovascular:     Rate and Rhythm: Normal rate and regular rhythm.  Pulmonary:     Effort: Pulmonary effort is normal.  Abdominal:     Hernia: No hernia is present.     Comments: suprapubic and right lower quadrant tenderness.  No rebound or guarding.  No hernias palpated.  Skin:    General: Skin is warm.     Capillary Refill: Capillary refill takes less than 2 seconds.  Neurological:     Mental Status: She is alert and oriented to person, place, and time.     ED Results / Procedures / Treatments   Labs (all labs ordered are listed, but only abnormal results are displayed) Labs Reviewed  URINALYSIS, ROUTINE W REFLEX MICROSCOPIC - Abnormal; Notable for the following components:      Result Value   Hgb urine dipstick TRACE (*)    All other components within normal limits  URINALYSIS, MICROSCOPIC (REFLEX) - Abnormal; Notable for the following components:   Bacteria, UA RARE (*)    All other components within normal limits  BASIC METABOLIC PANEL - Abnormal; Notable for the following components:   Sodium 134 (*)    Glucose, Bld 104 (*)    Calcium 7.7 (*)    Anion gap 4 (*)    All other components within normal limits  PROTIME-INR - Abnormal; Notable for the following components:   Prothrombin Time 23.6 (*)    INR 2.1 (*)    All other components within normal limits  CBC WITH DIFFERENTIAL/PLATELET    EKG None  Radiology CT ABDOMEN PELVIS WO CONTRAST  Result Date: 12/08/2020 CLINICAL DATA:  Lower abdominal pain for 2 days, pain with urination, pain greater on RIGHT, history of hypercoagulable state with chronic anticoagulation, prior pulmonary embolism, hysterectomy EXAM: CT ABDOMEN AND PELVIS WITHOUT CONTRAST TECHNIQUE: Multidetector CT imaging of the abdomen and pelvis was performed following the standard protocol without IV contrast. Sagittal and coronal MPR images reconstructed from axial data set. No oral contrast administered. COMPARISON:  12/23/2018  FINDINGS: Lower chest: Minimal dependent atelectasis at lung bases Hepatobiliary: Gallbladder and liver normal appearance Pancreas: Normal appearance Spleen: Normal appearance Adrenals/Urinary Tract: Adrenal glands, kidneys, ureters, and bladder normal appearance Stomach/Bowel: Normal appendix. Minimal sigmoid diverticulosis without evidence of diverticulitis. Small hiatal hernia. Stomach and bowel loops otherwise normal appearance. Vascular/Lymphatic: Aorta normal caliber. Scattered normal sized pelvic lymph nodes. No adenopathy. Reproductive: Uterus surgically absent. Nonvisualization LEFT ovary. Enlarged RIGHT ovary containing a 7.3 x 7.2 x 5.4 cm cystic mass. Other: No free air or free fluid. No hernia or inflammatory process. Musculoskeletal: Unremarkable IMPRESSION:  7.3 cm diameter cyst of the RIGHT ovary; characterization by ultrasound recommended. Small hiatal hernia. Minimal sigmoid diverticulosis without evidence of diverticulitis. No other intra-abdominal or intrapelvic abnormalities. Electronically Signed   By: Lavonia Dana M.D.   On: 12/08/2020 11:24   US PELVIC COMPLETE W TRANSVAGINAL AND TORSION R/O  Result Date: 12/08/2020 CLINICAL DATA:  RIGHT lower quadrant pain, follow-up abnormal CT EXAM: TRANSABDOMINAL AND TRANSVAGINAL ULTRASOUND OF PELVIS DOPPLER ULTRASOUND OF OVARIES TECHNIQUE: Both transabdominal and transvaginal ultrasound examinations of the pelvis were performed. Transabdominal technique was performed for global imaging of the pelvis including uterus, ovaries, adnexal regions, and pelvic cul-de-sac. It was necessary to proceed with endovaginal exam following the transabdominal exam to visualize the ovaries. Color and duplex Doppler ultrasound was utilized to evaluate blood flow to the ovaries. COMPARISON:  CT abdomen and pelvis 12/08/2020 FINDINGS: Uterus Surgically absent Endometrium N/A Right ovary 7.7 x 6.3 x 6.3 cm = volume: 159 mL. Nearly completely replaced by a complicated cystic  lesion with a fluid fluid level, anechoic fluid over hypoechoic material. This lesion corresponds in size with the visualized CT abnormality. This may either represent a hemorrhagic cyst with a hematocrit level or an endometrioma. Dermoid tumor is considered much less likely since no fat is seen within the lesion on CT. Left ovary Measurements: 2.3 x 2.0 x 1.9 cm = volume: 4.4 mL. Seen suboptimally on transabdominal imaging, obscured by bowel on transvaginal imaging. No gross mass. Pulsed Doppler evaluation of both ovaries demonstrates normal venous waveform in LEFT ovary and an arterial waveform which appears somewhat higher resistance though this may be due to the small size and suboptimal visualization of LEFT ovary. Low resistance arterial and venous waveforms are present in the RIGHT ovary. Other findings No abnormal free fluid. IMPRESSION: Post hysterectomy with grossly unremarkable LEFT ovary. Complicated cystic lesion within RIGHT ovary 7.7 cm greatest diameter, demonstrating a fluid fluid level; this is favored to represent either a hemorrhagic cyst within the hematocrit level or an endometrioma. Follow-up ultrasound recommended in 6-12 weeks to reassess. No evidence of ovarian torsion. Electronically Signed   By: Lavonia Dana M.D.   On: 12/08/2020 12:51    Procedures Procedures   Medications Ordered in ED Medications - No data to display  ED Course  I have reviewed the triage vital signs and the nursing notes.  Pertinent labs & imaging results that were available during my care of the patient were reviewed by me and considered in my medical decision making (see chart for details).    MDM Rules/Calculators/A&P                          Patient with lower abdominal pain.  Some dysuria.  Urine however did not show infection.  CT scan done due to pain in right lower quadrant.  No appendicitis seen but did show ovarian abnormality.  Had a head hysterectomy but still has ovaries.  Was potential  cyst on the ovary.  Ultrasound recommended.  Ultrasound done showed complex cyst.  Potentially hemorrhagic cyst.  Discussed with patient and will follow up with OB/GYN or PCP for follow-up ultrasound.  Appears stable now.  Discharge home Final Clinical Impression(s) / ED Diagnoses Final diagnoses:  Cyst of right ovary    Rx / DC Orders ED Discharge Orders    None       Davonna Belling, MD 12/08/20 1328

## 2020-12-17 ENCOUNTER — Other Ambulatory Visit: Payer: Self-pay

## 2021-01-05 ENCOUNTER — Ambulatory Visit: Payer: Medicaid Other | Admitting: Pharmacist

## 2021-01-20 ENCOUNTER — Other Ambulatory Visit: Payer: Self-pay

## 2021-01-25 ENCOUNTER — Other Ambulatory Visit: Payer: Self-pay

## 2021-02-18 ENCOUNTER — Other Ambulatory Visit: Payer: Self-pay

## 2021-02-18 ENCOUNTER — Ambulatory Visit: Payer: Medicaid Other | Attending: Family Medicine | Admitting: Pharmacist

## 2021-02-18 DIAGNOSIS — Z7901 Long term (current) use of anticoagulants: Secondary | ICD-10-CM | POA: Diagnosis not present

## 2021-02-18 LAB — POCT INR: INR: 3.8 — AB (ref 2.0–3.0)

## 2021-02-28 ENCOUNTER — Other Ambulatory Visit: Payer: Self-pay | Admitting: Family Medicine

## 2021-02-28 NOTE — Telephone Encounter (Signed)
Requested medications are due for refill today Filled 01/25/21  Requested medications are on the active medication list yes  Last refill 7/12  Last visit 07/2020, attends coumadin clinic routinely  Future visit scheduled no  Notes to clinic Not Delegated.

## 2021-03-01 ENCOUNTER — Other Ambulatory Visit: Payer: Self-pay

## 2021-03-01 MED ORDER — WARFARIN SODIUM 5 MG PO TABS
ORAL_TABLET | ORAL | 1 refills | Status: DC
  Filled 2021-03-01: qty 60, 30d supply, fill #0
  Filled 2021-04-03: qty 60, 30d supply, fill #1
  Filled 2021-05-17: qty 60, 30d supply, fill #2

## 2021-03-02 ENCOUNTER — Other Ambulatory Visit: Payer: Self-pay

## 2021-03-03 ENCOUNTER — Other Ambulatory Visit: Payer: Self-pay

## 2021-03-06 DIAGNOSIS — Z20822 Contact with and (suspected) exposure to covid-19: Secondary | ICD-10-CM | POA: Diagnosis not present

## 2021-03-18 ENCOUNTER — Other Ambulatory Visit: Payer: Self-pay

## 2021-03-18 ENCOUNTER — Ambulatory Visit: Payer: Medicaid Other | Attending: Family Medicine | Admitting: Pharmacist

## 2021-03-18 DIAGNOSIS — Z7901 Long term (current) use of anticoagulants: Secondary | ICD-10-CM | POA: Diagnosis not present

## 2021-03-18 LAB — POCT INR: INR: 3.2 — AB (ref 2.0–3.0)

## 2021-04-04 ENCOUNTER — Other Ambulatory Visit: Payer: Self-pay

## 2021-04-07 ENCOUNTER — Ambulatory Visit: Payer: Medicaid Other | Admitting: Pharmacist

## 2021-04-07 ENCOUNTER — Ambulatory Visit: Payer: Medicaid Other | Attending: Family Medicine | Admitting: Pharmacist

## 2021-04-07 ENCOUNTER — Other Ambulatory Visit: Payer: Self-pay

## 2021-04-07 DIAGNOSIS — Z7901 Long term (current) use of anticoagulants: Secondary | ICD-10-CM

## 2021-04-07 LAB — POCT INR: INR: 1.9 — AB (ref 2.0–3.0)

## 2021-04-28 ENCOUNTER — Ambulatory Visit: Payer: Medicaid Other | Admitting: Pharmacist

## 2021-05-17 ENCOUNTER — Other Ambulatory Visit: Payer: Self-pay

## 2021-05-18 ENCOUNTER — Other Ambulatory Visit: Payer: Self-pay

## 2021-06-17 ENCOUNTER — Other Ambulatory Visit: Payer: Self-pay

## 2021-06-17 ENCOUNTER — Encounter: Payer: Self-pay | Admitting: Hematology

## 2021-06-17 ENCOUNTER — Telehealth: Payer: Medicaid Other | Admitting: Physician Assistant

## 2021-06-17 ENCOUNTER — Ambulatory Visit: Payer: Self-pay

## 2021-06-17 DIAGNOSIS — J111 Influenza due to unidentified influenza virus with other respiratory manifestations: Secondary | ICD-10-CM

## 2021-06-17 MED ORDER — PSEUDOEPH-BROMPHEN-DM 30-2-10 MG/5ML PO SYRP
5.0000 mL | ORAL_SOLUTION | Freq: Four times a day (QID) | ORAL | 0 refills | Status: DC | PRN
  Filled 2021-06-17: qty 120, 6d supply, fill #0

## 2021-06-17 NOTE — Progress Notes (Signed)
E visit for Flu like symptoms   We are sorry that you are not feeling well.  Here is how we plan to help! Based on what you have shared with me it looks like you may have a respiratory virus that may be influenza.  Influenza or "the flu" is   an infection caused by a respiratory virus. The flu virus is highly contagious and persons who did not receive their yearly flu vaccination may "catch" the flu from close contact.  We have anti-viral medications to treat the viruses that cause this infection. They are not a "cure" and only shorten the course of the infection. These prescriptions are most effective when they are given within the first 2 days of "flu" symptoms. Antiviral medication are indicated if you have a high risk of complications from the flu. You should  also consider an antiviral medication if you are in close contact with someone who is at risk. These medications can help patients avoid complications from the flu  but have side effects that you should know. Possible side effects from Tamiflu or oseltamivir include nausea, vomiting, diarrhea, dizziness, headaches, eye redness, sleep problems or other respiratory symptoms. You should not take Tamiflu if you have an allergy to oseltamivir or any to the ingredients in Tamiflu.  Based upon your symptoms and potential risk factors I recommend that you follow the flu symptoms recommendation that I have listed below.  I will prescribe Bromfed DM cough syrup for you. Continue tylenol for body aches and pains. Can also do epsom salt baths for body aches as well.   ANYONE WHO HAS FLU SYMPTOMS SHOULD: Stay home. The flu is highly contagious and going out or to work exposes others! Be sure to drink plenty of fluids. Water is fine as well as fruit juices, sodas and electrolyte beverages. You may want to stay away from caffeine or alcohol. If you are nauseated, try taking small sips of liquids. How do you know if you are getting enough fluid? Your urine  should be a pale yellow or almost colorless. Get rest. Taking a steamy shower or using a humidifier may help nasal congestion and ease sore throat pain. Using a saline nasal spray works much the same way. Cough drops, hard candies and sore throat lozenges may ease your cough. Line up a caregiver. Have someone check on you regularly.   GET HELP RIGHT AWAY IF: You cannot keep down liquids or your medications. You become short of breath Your fell like you are going to pass out or loose consciousness. Your symptoms persist after you have completed your treatment plan MAKE SURE YOU  Understand these instructions. Will watch your condition. Will get help right away if you are not doing well or get worse.  Your e-visit answers were reviewed by a board certified advanced clinical practitioner to complete your personal care plan.  Depending on the condition, your plan could have included both over the counter or prescription medications.  If there is a problem please reply  once you have received a response from your provider.  Your safety is important to [place].  If you have drug allergies check your prescription carefully.    You can use MyChart to ask questions about today's visit, request a non-urgent call back, or ask for a work or school excuse for 24 hours related to this e-Visit. If it has been greater than 24 hours you will need to follow up with your provider, or enter a new e-Visit to  address those concerns.  You will get an e-mail in the next two days asking about your experience.  I hope that your e-visit has been valuable and will speed your recovery. Thank you for using e-visits.  I provided 6 minutes of non face-to-face time during this encounter for chart review and documentation.

## 2021-06-17 NOTE — Telephone Encounter (Signed)
Patient called and says she has the flu, diagnosed by a test done at [facility]. She says she was on a cruise last week and on Saturday started feeling bad after returning home. She says she was tested on yesterday, thought she had COVID, but they tested for Flu. She is asking for a medication to be sent in, I advised she will need a visit. Symptoms-stuffy nose, no taste/smell, no appetite, body aches, cough that has eased up. She's taking OTC medications-cough medicine, alka seltzer cold/flu, Tylenol. Denies fever, denies SOB, denies other symptoms. Advised no availability with PCP or any provider in the office, advised to do a MyChart Virtual UC visit or E-Visit, care advice given, patient verbalized understanding.   Summary: advice- flu   Pt stated she has the flu and wanted to know medication could be sent in or if she needs to schedule an appt, pt needed some advice on what will clear up her symptoms.      Reason for Disposition  [1] Fever returns after gone for over 24 hours AND [2] symptoms worse or not improved  Answer Assessment - Initial Assessment Questions 1. DIAGNOSIS CONFIRMATION: "When was the influenza diagnosed?" "By whom?" "Did you get a test for it?"     Tested positive at [facility] on Thursday 06/16/21 2. MEDICINES: "Were you prescribed any medications for the influenza?"  (e.g., zanamivir [Relenza], oseltamavir [Tamiflu]).      No 3. ONSET of SYMPTOMS: "When did your symptoms start?"     Saturday 06/11/21 4. SYMPTOMS: "What symptoms are you most concerned about?" (e.g., runny nose, stuffy nose, sore throat, cough, breathing difficulty, fever)     Stuffy nose, no appetite, body aches, no taste/smell 5. COUGH: "How bad is the cough?"     It has eased up a lot now 6. FEVER: "Do you have a fever?" If Yes, ask: "What is your temperature, how was it measured, and when did it start?"     No 7. RESPIRATORY DISTRESS: "Are you having any trouble breathing?" If Yes, ask: "Describe your  breathing."      No 8. FLU VACCINE: "Did you receive a flu shot this year?" (e.g., seasonal influenza, H1N1)     No 9. PREGNANCY: "Is there any chance you are pregnant?" "When was your last menstrual period?"     N/A 10. HIGH RISK for COMPLICATIONS: "Do you have any heart or lung problems? Do you have a weakened immune system?" (e.g., CHF, COPD, asthma, HIV positive, chemotherapy, renal failure, diabetes mellitus, sickle cell anemia)       No  Protocols used: Influenza Follow-up Call-A-AH

## 2021-06-23 ENCOUNTER — Other Ambulatory Visit: Payer: Self-pay | Admitting: Family Medicine

## 2021-06-23 MED ORDER — WARFARIN SODIUM 5 MG PO TABS
ORAL_TABLET | ORAL | 0 refills | Status: DC
  Filled 2021-06-23: qty 60, 30d supply, fill #0
  Filled 2021-07-27: qty 60, 30d supply, fill #1
  Filled 2021-07-28: qty 30, 15d supply, fill #0

## 2021-06-24 ENCOUNTER — Other Ambulatory Visit: Payer: Self-pay

## 2021-07-28 ENCOUNTER — Other Ambulatory Visit: Payer: Self-pay

## 2021-08-01 ENCOUNTER — Other Ambulatory Visit: Payer: Self-pay

## 2021-08-01 ENCOUNTER — Telehealth: Payer: Self-pay | Admitting: Family Medicine

## 2021-08-01 NOTE — Telephone Encounter (Signed)
Copied from [place] [id number]. Topic: Appointment Scheduling - Scheduling Inquiry for Clinic >> Aug 01, 2021 12:10 PM Mcgough, Nelva Bush wrote: Reason for CRM: Pt  requesting to schedule an appointment with Pharmacist Lurena Joiner" Donna [name]. Donna [name])

## 2021-08-15 ENCOUNTER — Telehealth: Payer: Self-pay | Admitting: Family Medicine

## 2021-08-16 NOTE — Telephone Encounter (Signed)
Requested medications are due for refill today.  yes  Requested medications are on the active medications list.  yes  Last refill. 06/23/2021  Future visit scheduled.   no  Notes to clinic.  Pt is overdue for OV. Medication not delegated.    Requested Prescriptions  Pending Prescriptions Disp Refills   warfarin (COUMADIN) 5 MG tablet 90 tablet 0    Sig: TAKE 1 TO 2 TABLETS BY MOUTH DAILY AS DIRECTED BY THE COUMADIN CLINIC.     Hematology:  Anticoagulants - warfarin Failed - 08/15/2021 11:31 PM      Failed - This refill cannot be delegated      Failed - If the patient is managed by Coumadin Clinic - route to their Pool. If not, forward to the provider.      Failed - INR in normal range and within 30 days    INR  Date Value Ref Range Status  04/07/2021 1.9 (A) 2.0 - 3.0 Final  12/08/2020 2.1 (H) 0.8 - 1.2 Final    Comment:    (NOTE) INR goal varies based on device and disease states. Performed at [facility], [address] [place], [place] [address]           Failed - Valid encounter within last 3 months    Recent Outpatient Visits           9 months ago Chronic anticoagulation   [place], RPH-CPP   1 year ago Screening for colon cancer   [place] Charlott Rakes, MD   2 years ago Pure hypercholesterolemia   [place], Enobong, MD   3 years ago Personal history of venous thrombosis and embolism   [place], Enobong, MD   3 years ago Screening for breast cancer   [place] Charlott Rakes, MD

## 2021-08-17 ENCOUNTER — Other Ambulatory Visit: Payer: Self-pay

## 2021-08-17 ENCOUNTER — Other Ambulatory Visit: Payer: Self-pay | Admitting: Family Medicine

## 2021-08-17 NOTE — Telephone Encounter (Signed)
Pt called in on 08/01/2021 requesting an appointment with pharmacist but has not heard back. Pt stated she has no more medication and she is traveling out of the country on Saturday and is concerned since she doesn't have anymore medication.   Pt requesting a call back.

## 2021-08-17 NOTE — Telephone Encounter (Signed)
Pt calling back stated she has not received a call back to schedule an appointment with Pharmacist [facility]" Jarome Matin. Daisy Blossom)

## 2021-08-17 NOTE — Telephone Encounter (Signed)
°   Requested medication (s) are due for refill today: request early  Requested medication (s) are on the active medication list: yes  Last refill:  06/23/21 #90 for 0 RF   Future visit scheduled: no  Notes to clinic:  No protocol to follow, please assess.  Requested Prescriptions  Pending Prescriptions Disp Refills   warfarin (COUMADIN) 5 MG tablet 90 tablet 0    Sig: TAKE 1 TO 2 TABLETS BY MOUTH DAILY AS DIRECTED BY THE COUMADIN CLINIC.     Hematology:  Anticoagulants - warfarin Failed - 08/17/2021  2:29 PM      Failed - Manual Review: If patient's warfarin is managed by Anti-Coag team, route request to them. If not, route request to the provider.      Failed - INR in normal range and within 30 days    INR  Date Value Ref Range Status  04/07/2021 1.9 (A) 2.0 - 3.0 Final  12/08/2020 2.1 (H) 0.8 - 1.2 Final    Comment:    (NOTE) INR goal varies based on device and disease states. Performed at [facility], [address] [place], [place] [address]           Failed - Valid encounter within last 3 months    Recent Outpatient Visits           9 months ago Chronic anticoagulation   [place], RPH-CPP   1 year ago Screening for colon cancer   [place] Charlott Rakes, MD   2 years ago Pure hypercholesterolemia   [place], Enobong, MD   3 years ago Personal history of venous thrombosis and embolism   [place], Charlane Ferretti, MD   3 years ago Screening for breast cancer   [place], [place], MD              Passed - HCT in normal range and within 360 days    HCT  Date Value Ref Range Status  12/08/2020 40.1 36.0 - 46.0 % Final  07/11/2017 38.5 34.8 - 46.6 % Final          Passed - Patient is not pregnant

## 2021-08-18 ENCOUNTER — Other Ambulatory Visit: Payer: Self-pay

## 2021-08-18 ENCOUNTER — Ambulatory Visit: Payer: Medicaid Other | Attending: Family Medicine | Admitting: Pharmacist

## 2021-08-18 ENCOUNTER — Other Ambulatory Visit: Payer: Self-pay | Admitting: Pharmacist

## 2021-08-18 DIAGNOSIS — Z7901 Long term (current) use of anticoagulants: Secondary | ICD-10-CM

## 2021-08-18 LAB — POCT INR: INR: 2 (ref 2.0–3.0)

## 2021-08-18 MED ORDER — WARFARIN SODIUM 5 MG PO TABS
ORAL_TABLET | ORAL | 2 refills | Status: DC
  Filled 2021-08-18: qty 60, 30d supply, fill #0
  Filled 2021-09-20: qty 60, 30d supply, fill #1
  Filled 2021-10-18: qty 60, 30d supply, fill #2

## 2021-08-18 NOTE — Telephone Encounter (Signed)
Pt was called and she states that she has picked up the medication.

## 2021-08-18 NOTE — Telephone Encounter (Signed)
°   Requested medication (s) are due for refill today: request early   Requested medication (s) are on the active medication list: yes   Last refill:  06/23/21 #90 for 0 RF     Future visit scheduled: no   Notes to clinic:  No protocol to follow, please assess.  Requested Prescriptions  Pending Prescriptions Disp Refills   warfarin (COUMADIN) 5 MG tablet 90 tablet 0    Sig: TAKE 1 TO 2 TABLETS BY MOUTH DAILY AS DIRECTED BY THE COUMADIN CLINIC.     Hematology:  Anticoagulants - warfarin Failed - 08/17/2021  2:29 PM      Failed - Manual Review: If patient's warfarin is managed by Anti-Coag team, route request to them. If not, route request to the provider.      Failed - INR in normal range and within 30 days    INR  Date Value Ref Range Status  04/07/2021 1.9 (A) 2.0 - 3.0 Final  12/08/2020 2.1 (H) 0.8 - 1.2 Final    Comment:    (NOTE) INR goal varies based on device and disease states. Performed at [facility], [address] [place], [place] [address]           Failed - Valid encounter within last 3 months    Recent Outpatient Visits           9 months ago Chronic anticoagulation   [place], RPH-CPP   1 year ago Screening for colon cancer   [facility]ise Charlott Rakes, MD   2 years ago Pure hypercholesterolemia   Harding, Enobong, MD   3 years ago Personal history of venous thrombosis and embolism   [place], Charlane Ferretti, MD   3 years ago Screening for breast cancer   [place], Cayey, MD              Passed - HCT in normal range and within 360 days    HCT  Date Value Ref Range Status  12/08/2020 40.1 36.0 - 46.0 % Final  07/11/2017 38.5 34.8 - 46.6 % Final          Passed - Patient is not pregnant

## 2021-09-01 ENCOUNTER — Ambulatory Visit: Payer: Self-pay

## 2021-09-01 NOTE — Telephone Encounter (Signed)
°  Chief Complaint: urinary frequency Symptoms: urinary frequency and incontinence at times Frequency: going on for a while but gotten worse Pertinent Negatives: Patient denies burning or pain Disposition: [] ED /[] Urgent Care (no appt availability in office) / [] Appointment(In office/virtual)/ []  [facility]/ [] Home Care/ [] Refused Recommended Disposition /[x] [facility]/ []  Follow-up with PCP Additional Notes: Advised pt no appts in office until 09/28/21, gave pt info to call [facility] to see if they accepting walkins or will let her make an appt for tomorrow and advised her to call back if unable to be seen there to see if we can do virtual appt.    Summary: frequent urination   Pt called saying she has been having frequent urination.  She was asking to be seen but the first appt is a month out.   CB#  [phone]      Reason for Disposition  Urinating more frequently than usual (i.e., frequency)  Answer Assessment - Initial Assessment Questions 1. SYMPTOM: "What's the main symptom you're concerned about?" (e.g., frequency, incontinence)     frequency 2. ONSET: "When did the  sx  start?"     Been going on for a while but gotten worse 3. PAIN: "Is there any pain?" If Yes, ask: "How bad is it?" (Scale: 1-10; mild, moderate, severe)     no 5. OTHER SYMPTOMS: "Do you have any other symptoms?" (e.g., fever, flank pain, blood in urine, pain with urination)     No  Protocols used: Urinary Symptoms-A-AH

## 2021-09-02 NOTE — Telephone Encounter (Signed)
Pt was called and informed to drop off urine sample to office for testing.

## 2021-09-05 ENCOUNTER — Telehealth: Payer: Self-pay

## 2021-09-05 ENCOUNTER — Ambulatory Visit: Payer: Medicaid Other | Attending: Family Medicine

## 2021-09-05 ENCOUNTER — Other Ambulatory Visit: Payer: Self-pay

## 2021-09-05 DIAGNOSIS — R350 Frequency of micturition: Secondary | ICD-10-CM

## 2021-09-05 LAB — POCT URINALYSIS DIP (CLINITEK)
Bilirubin, UA: NEGATIVE
Glucose, UA: NEGATIVE mg/dL
Ketones, POC UA: NEGATIVE mg/dL
Leukocytes, UA: NEGATIVE
Nitrite, UA: NEGATIVE
POC PROTEIN,UA: NEGATIVE
Spec Grav, UA: 1.025
Urobilinogen, UA: 0.2 U/dL
pH, UA: 6.5

## 2021-09-05 NOTE — Progress Notes (Signed)
Pt arrived to drop off urine sample  Sample has been ran and results have been sent to PCP for review.

## 2021-09-05 NOTE — Telephone Encounter (Signed)
Pt dropped off urine sample for testing. She has been having frequent urination.  Results are in chart.

## 2021-09-06 NOTE — Telephone Encounter (Signed)
Noted  

## 2021-09-06 NOTE — Telephone Encounter (Signed)
Results sent to patient via MyChart

## 2021-09-15 ENCOUNTER — Ambulatory Visit: Payer: Medicaid Other | Admitting: Pharmacist

## 2021-09-21 ENCOUNTER — Other Ambulatory Visit: Payer: Self-pay

## 2021-09-23 ENCOUNTER — Other Ambulatory Visit: Payer: Self-pay

## 2021-10-04 ENCOUNTER — Emergency Department (HOSPITAL_BASED_OUTPATIENT_CLINIC_OR_DEPARTMENT_OTHER)
Admission: EM | Admit: 2021-10-04 | Discharge: 2021-10-04 | Disposition: A | Discharge status: Home / Self Care | Payer: Medicaid Other | Attending: Emergency Medicine | Admitting: Emergency Medicine

## 2021-10-04 ENCOUNTER — Encounter (HOSPITAL_BASED_OUTPATIENT_CLINIC_OR_DEPARTMENT_OTHER): Payer: Self-pay

## 2021-10-04 ENCOUNTER — Other Ambulatory Visit: Payer: Self-pay

## 2021-10-04 ENCOUNTER — Emergency Department (HOSPITAL_BASED_OUTPATIENT_CLINIC_OR_DEPARTMENT_OTHER): Payer: Medicaid Other

## 2021-10-04 DIAGNOSIS — Z79899 Other long term (current) drug therapy: Secondary | ICD-10-CM | POA: Diagnosis not present

## 2021-10-04 DIAGNOSIS — N83201 Unspecified ovarian cyst, right side: Secondary | ICD-10-CM | POA: Diagnosis not present

## 2021-10-04 DIAGNOSIS — R102 Pelvic and perineal pain: Secondary | ICD-10-CM | POA: Diagnosis not present

## 2021-10-04 DIAGNOSIS — Z90710 Acquired absence of both cervix and uterus: Secondary | ICD-10-CM | POA: Diagnosis not present

## 2021-10-04 DIAGNOSIS — Z7901 Long term (current) use of anticoagulants: Secondary | ICD-10-CM | POA: Insufficient documentation

## 2021-10-04 DIAGNOSIS — N83202 Unspecified ovarian cyst, left side: Secondary | ICD-10-CM | POA: Insufficient documentation

## 2021-10-04 LAB — URINALYSIS, ROUTINE W REFLEX MICROSCOPIC
Bilirubin Urine: NEGATIVE
Glucose, UA: NEGATIVE mg/dL
Ketones, ur: NEGATIVE mg/dL
Leukocytes,Ua: NEGATIVE
Nitrite: NEGATIVE
Protein, ur: NEGATIVE mg/dL
Specific Gravity, Urine: 1.025 (ref 1.005–1.030)
pH: 7 (ref 5.0–8.0)

## 2021-10-04 LAB — URINALYSIS, MICROSCOPIC (REFLEX)

## 2021-10-04 NOTE — ED Triage Notes (Signed)
C/o lower abdominal pressure with urination x 1 week. States has improved with drinking more water. Denies other symptoms. ?

## 2021-10-04 NOTE — Discharge Instructions (Addendum)
Follow up with your PCP for recheck. ?You ultrasound today showed several smaller cysts, the large cyst on your [place] from last year is no longer present.  ?

## 2021-10-04 NOTE — ED Provider Notes (Signed)
?[facility] EMERGENCY DEPARTMENT ?Provider Note ? ? ?CSN: [id number] ?Arrival date & time: 10/04/21  6314 ? ?  ? ?History ? ?Chief Complaint  ?Patient presents with  ? Abdominal Pain  ? ? ?Donna [name] is a 51 y.o. female. ? ?51 year old female presents with complaint of suprapubic discomfort with voiding x 1 week. Thought she might be getting a UTI secondary to drinking a lot of tea, switched to drinking water and symptoms improved but persist.  Denies hematuria, fevers, chills, nausea, vomiting or changes in bowel or bladder habits.  States that she did have similar symptoms 1 year ago, was seen here at that time and diagnosed with ovarian cyst however did not follow-up with this with her PCP. 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 ?Status post hysterectomy. ? ? ?  ? ?Home Medications ?Prior to Admission medications   ?Medication Sig Start Date End Date Taking? Authorizing Provider  ?acetaminophen (TYLENOL) 500 MG tablet Take 1,000 mg by mouth every 6 (six) hours as needed for mild pain.    [provider]  ?atorvastatin (LIPITOR) 20 MG tablet Take 1 tablet (20 mg total) by mouth daily. 12/25/19   Charlott Rakes, MD  ?brompheniramine-pseudoephedrine-DM 30-2-10 MG/5ML syrup Take 5 mLs by mouth 4 (four) times daily as needed. 06/17/21   Mar Daring, PA-C  ?ferrous sulfate 325 (65 FE) MG tablet One tablet orally twice daily with a meal ?Patient taking differently: Take 325 mg by mouth 2 (two) times daily with a meal. One tablet orally twice daily with a meal 01/12/16   Truitt Merle, MD  ?warfarin (COUMADIN) 5 MG tablet TAKE 1 TO 2 TABLETS BY MOUTH DAILY AS DIRECTED BY THE COUMADIN CLINIC. 08/18/21 08/18/22  Charlott Rakes, MD  ?enoxaparin (LOVENOX) 120 MG/0.8ML injection Inject 0.86 mLs (130 mg total) into the skin every 12 (twelve) hours. 07/09/19 08/19/19  Charlott Rakes, MD  ?   ? ?Allergies    ?Nsaids   ? ?Review of Systems   ?Review of Systems ?Negative except as per  HPI ?Physical Exam ?Updated Vital Signs ?BP (!) 143/86 (BP Location: Right Arm)   Pulse 70   Temp 98.1 ?F (36.7 ?C) (Oral)   Resp 18   Ht '5\' 10"'$  (1.778 m)   Wt 127 kg   LMP 07/05/2019 (Exact Date)   SpO2 100%   BMI 40.18 kg/m?  ?Physical Exam ?Vitals and nursing note reviewed.  ?Constitutional:   ?   General: She is not in acute distress. ?   Appearance: She is well-developed. She is not diaphoretic.  ?HENT:  ?   Head: Normocephalic and atraumatic.  ?Pulmonary:  ?   Effort: Pulmonary effort is normal.  ?Abdominal:  ?   Palpations: Abdomen is soft.  ?   Tenderness: There is abdominal tenderness in the suprapubic area. There is no right CVA tenderness or left CVA tenderness.  ?Skin: ?   General: Skin is warm and dry.  ?   Findings: No erythema or rash.  ?Neurological:  ?   Mental Status: She is alert and oriented to person, place, and time.  ?Psychiatric:     ?   Behavior: Behavior normal.  ? ? ?ED Results / Procedures / Treatments   ?Labs ?(all labs ordered are listed, but only abnormal results are displayed) ?Labs Reviewed  ?URINALYSIS, ROUTINE W REFLEX MICROSCOPIC - Abnormal; Notable for the following components:  ?    Result Value  ? Hgb urine dipstick TRACE (*)   ?  All other components within normal limits  ?URINALYSIS, MICROSCOPIC (REFLEX) - Abnormal; Notable for the following components:  ? Bacteria, UA RARE (*)   ? All other components within normal limits  ? ? ?EKG ?None ? ?Radiology ?US PELVIC COMPLETE WITH TRANSVAGINAL ? ?Result Date: 10/04/2021 ?CLINICAL DATA:  Pelvic pain EXAM: TRANSABDOMINAL AND TRANSVAGINAL ULTRASOUND OF PELVIS DOPPLER ULTRASOUND OF OVARIES TECHNIQUE: Both transabdominal and transvaginal ultrasound examinations of the pelvis were performed. Transabdominal technique was performed for global imaging of the pelvis including uterus, ovaries, adnexal regions, and pelvic cul-de-sac. It was necessary to proceed with endovaginal exam following the transabdominal exam to visualize the  ovaries. Color and duplex Doppler ultrasound was utilized to evaluate blood flow to the ovaries. COMPARISON:  12/08/2020 FINDINGS: Uterus Uterus is not seen consistent with history of hysterectomy. Right ovary Measurements: 9 x 5.3 x 5.9 cm = volume: 147.56 mL. There are few cysts in the right ovary. There is 3.7 cm complex cyst. There is 3.8 cm complex cyst. In the previous study, there was a large complex cyst measuring 7.7 cm in maximum diameter. Left ovary Measurements: 6.1 x 5.1 x 4.1 cm = volume: 66 mL. There is 4.9 x 4.1 x 3.6 cm cyst in the left adnexa without internal septations. Pulsed Doppler evaluation of both ovaries demonstrates normal low-resistance arterial and venous waveforms. Other findings No abnormal free fluid. IMPRESSION: Status post hysterectomy. There is 4.9 cm cyst in the left ovary, possibly functional left ovarian cyst. There are few cystic structures in the right adnexa largest measuring 3.8 cm in diameter suggesting dominant follicles and possibly functional cysts. Some of the cysts appear complex, possibly hemorrhagic cysts. Electronically Signed   By: Elmer Picker M.D.   On: 10/04/2021 13:13   ? ?Procedures ?Procedures  ? ? ?Medications Ordered in ED ?Medications - No data to display ? ?ED Course/ Medical Decision Making/ A&P ?  ?                        ?Medical Decision Making ?Amount and/or Complexity of Data Reviewed ?Labs: ordered. ?Radiology: ordered. ? ? ?51 year old female with complaint of suprapubic discomfort with voiding for the past week.  Initially thought to be a urinary tract infection however states that she had similar symptoms a year ago and was found to have a large ovarian cyst.  On exam, she has mild suprapubic tenderness, no CVA tenderness, she is overall well-appearing.  She is status post hysterectomy.  Review of records from her May 2022 visit, did have a right sided 7.7 cm ovarian cyst at that time was advised to have repeat ultrasound in 6 to 12 weeks  which was not done.  Her urinalysis today is unremarkable.  Work-up was continued with pelvic ultrasound to further evaluate for this concern for cyst and found to have smaller bilateral ovarian cysts without torsion.  Patient is on Coumadin, states that she takes generally Tylenol for her pain, does have another OTC pain reliever and monitors her INR regularly.  Patient is comfortable continuing with her regular pain management with plan to recheck with her PCP if her pain persists. ? ? ? ? ? ? ? ?Final Clinical Impression(s) / ED Diagnoses ?Final diagnoses:  ?Pelvic pain in female  ?Cysts of both ovaries  ? ? ?Rx / DC Orders ?ED Discharge Orders   ? ? None  ? ?  ? ? ?  ?Tacy Learn, PA-C ?10/04/21 1330 ? ?  ?Gareth Morgan,  MD ?10/05/21 2257 ? ?

## 2021-10-05 ENCOUNTER — Telehealth: Payer: Self-pay

## 2021-10-05 NOTE — Telephone Encounter (Signed)
Transition Care Management Follow-up Telephone Call ?Date of discharge and from where: 10/04/2021 from [facility] ?How have you been since you were released from the hospital? Patient stated that she is feeling better and did not have any questions or concerns at this time.  ?Any questions or concerns? No ? ?Items Reviewed: ?Did the pt receive and understand the discharge instructions provided? Yes  ?Medications obtained and verified? Yes  ?Other? No  ?Any new allergies since your discharge? No  ?Dietary orders reviewed? No ?Do you have support at home? Yes  ? ?Functional Questionnaire: (I = Independent and D = Dependent) ?ADLs: I ? ?Bathing/Dressing- I ? ?Meal Prep- I ? ?Eating- I ? ?Maintaining continence- I ? ?Transferring/Ambulation- I ? ?Managing Meds- I ? ? ?Follow up appointments reviewed: ? ?PCP Hospital f/u appt confirmed? No   ?Specialist Hospital f/u appt confirmed? No   ?Are transportation arrangements needed? No  ?If their condition worsens, is the pt aware to call PCP or go to the Emergency Dept.? Yes ?Was the patient provided with contact information for the PCP's office or ED? Yes ?Was to pt encouraged to call back with questions or concerns? Yes ? ?

## 2021-10-19 ENCOUNTER — Other Ambulatory Visit: Payer: Self-pay

## 2021-10-21 ENCOUNTER — Other Ambulatory Visit: Payer: Self-pay

## 2021-10-25 ENCOUNTER — Ambulatory Visit: Payer: Medicaid Other | Admitting: Pharmacist

## 2021-11-26 NOTE — Progress Notes (Signed)
? ?Established Patient Office Visit ? ?Subjective   ?Patient ID: Donna [name], female    DOB: 1970-12-29  Age: 51 y.o. MRN: [id number] ? ?Chief Complaint  ?Patient presents with  ? Medication Refill  ? ?Col[facility] mammo hcv  ?This is a pleasant 51 year old female primary care patient of Dr. Lucia Gaskins comes to the clinic today in follow-up.  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  Patient has class III severe obesity and cannot take direct oral anticoagulants.  Patient is on 5 mg of warfarin Monday and Friday 10 mg of rest of the week and is managed in our warfarin clinic.  Note she is due a mammogram and colonoscopy and needs hepatitis C screening.  Also she complains of stress urinary incontinence type symptoms.  Note she is taking iron supplements and needs the iron level rechecked. ? ?Patient was supposed to be on atorvastatin but has not been on this in over a year.  She had elevated lipid levels in 2020.  She denies any burning on urination. ? ? ?Patient Active Problem List  ? Diagnosis Date Noted  ? Stress incontinence 11/28/2021  ? Hyperlipidemia 10/17/2017  ? Hypercoagulable state ([place]) 09/11/2017  ? Personal history of venous thrombosis and embolism 08/01/2015  ? Iron deficiency anemia 07/16/2014  ? Pleuritic chest pain 07/16/2014  ? High risk medication use 02/12/2013  ? Chronic anticoagulation 02/12/2013  ? Healthcare maintenance 02/12/2013  ? Class 3 severe obesity due to excess calories without serious comorbidity with body mass index (BMI) of 40.0 to 44.9 in adult [facility])   ? History of pulmonary embolism 12/05/2012  ? Anemia 12/05/2012  ? ?Past Medical History:  ?Diagnosis Date  ? DVT (deep venous thrombosis) ([place]) 12/06/2012  ? Lung collapse   ? PONV (postoperative nausea and vomiting)   ? Pulmonary embolism affecting pregnancy   ? ?Past Surgical History:  ?Procedure Laterality Date  ? HYSTERECTOMY ABDOMINAL WITH SALPINGECTOMY Bilateral 07/15/2019  ? Procedure:  HYSTERECTOMY ABDOMINAL WITH SALPINGECTOMY;  Surgeon: [name], MD;  Location: [facility];  Service: Gynecology;  Laterality: Bilateral;  ? RIB RESECTION    ? ?Social History  ? ?Tobacco Use  ? Smoking status: Never  ? Smokeless tobacco: Never  ?Vaping Use  ? Vaping Use: Never used  ?Substance Use Topics  ? Alcohol use: Yes  ?  Comment: socially   ? Drug use: No  ? ?Social History  ? ?Socioeconomic History  ? Marital status: Single  ?  Spouse name: Not on file  ? Number of children: Not on file  ? Years of education: Not on file  ? Highest education level: Not on file  ?Occupational History  ? Not on file  ?Tobacco Use  ? Smoking status: Never  ? Smokeless tobacco: Never  ?Vaping Use  ? Vaping Use: Never used  ?Substance and Sexual Activity  ? Alcohol use: Yes  ?  Comment: socially   ? Drug use: No  ? Sexual activity: Not on file  ?Other Topics Concern  ? Not on file  ?Social History Narrative  ? Not on file  ? ?Social Determinants of Health  ? ?Financial Resource Strain: Not on file  ?Food Insecurity: Not on file  ?Transportation Needs: Not on file  ?Physical Activity: Not on file  ?Stress: Not on file  ?Social Connections: Not on file  ?Intimate Partner Violence: Not on file  ? ?Allergies  ?Allergen Reactions  ? Nsaids Other (See Comments)  ?  ON COUMADIN  ? ?  ? ?Review of Systems  ?Constitutional:  Negative for chills, diaphoresis, fever, malaise/fatigue and weight loss.  ?HENT:  Negative for congestion, hearing loss, nosebleeds, sore throat and tinnitus.   ?Eyes:  Negative for blurred vision, photophobia and redness.  ?Respiratory:  Negative for cough, hemoptysis, sputum production, shortness of breath, wheezing and stridor.   ?Cardiovascular:  Negative for chest pain, palpitations, orthopnea, claudication, leg swelling and PND.  ?Gastrointestinal:  Negative for abdominal pain, blood in stool, constipation, diarrhea, heartburn, nausea and vomiting.  ?Genitourinary:  Positive for frequency. Negative for  dysuria, flank pain, hematuria and urgency.  ?     Stress incontinence  ?Musculoskeletal:  Negative for back pain, falls, joint pain, myalgias and neck pain.  ?     Left foot callus  ?Skin:  Negative for itching and rash.  ?Neurological:  Negative for dizziness, tingling, tremors, sensory change, speech change, focal weakness, seizures, loss of consciousness, weakness and headaches.  ?Endo/Heme/Allergies:  Negative for environmental allergies and polydipsia. Does not bruise/bleed easily.  ?Psychiatric/Behavioral:  Negative for depression, memory loss, substance abuse and suicidal ideas. The patient is not nervous/anxious and does not have insomnia.   ? ?  ?Objective:  ?  ? ?BP 127/85   Pulse 73   Wt 294 lb 12.8 oz (133.7 kg)   LMP 07/05/2019 (Exact Date)   SpO2 95%   BMI 42.30 kg/m?  ?BP Readings from Last 3 Encounters:  ?11/28/21 127/85  ?10/04/21 (!) 143/86  ?12/08/20 105/77  ? ?Wt Readings from Last 3 Encounters:  ?11/28/21 294 lb 12.8 oz (133.7 kg)  ?10/04/21 280 lb (127 kg)  ?12/08/20 285 lb (129.3 kg)  ? ?  ? ?Physical Exam ?Vitals reviewed.  ?Constitutional:   ?   Appearance: Normal appearance. She is well-developed. She is obese. She is not diaphoretic.  ?HENT:  ?   Head: Normocephalic and atraumatic.  ?   Nose: No nasal deformity, septal deviation, mucosal edema or rhinorrhea.  ?   Right Sinus: No maxillary sinus tenderness or frontal sinus tenderness.  ?   Left Sinus: No maxillary sinus tenderness or frontal sinus tenderness.  ?   Mouth/Throat:  ?   Pharynx: No oropharyngeal exudate.  ?Eyes:  ?   General: No scleral icterus. ?   Conjunctiva/sclera: Conjunctivae normal.  ?   Pupils: Pupils are equal, round, and reactive to light.  ?Neck:  ?   Thyroid: No thyromegaly.  ?   Vascular: No carotid bruit or JVD.  ?   Trachea: Trachea normal. No tracheal tenderness or tracheal deviation.  ?Cardiovascular:  ?   Rate and Rhythm: Normal rate and regular rhythm.  ?   Chest Wall: PMI is not displaced.  ?   Pulses:  Normal pulses. No decreased pulses.  ?   Heart sounds: Normal heart sounds, S1 normal and S2 normal. Heart sounds not distant. No murmur heard. ?No systolic murmur is present.  ?No diastolic murmur is present.  ?  No friction rub. No gallop. No S3 or S4 sounds.  ?Pulmonary:  ?   Effort: No tachypnea, accessory muscle usage or respiratory distress.  ?   Breath sounds: No stridor. No decreased breath sounds, wheezing, rhonchi or rales.  ?Chest:  ?   Chest wall: No tenderness.  ?Abdominal:  ?   General: Bowel sounds are normal. There is no distension.  ?   Palpations: Abdomen is soft. Abdomen is not rigid.  ?   Tenderness: There is no abdominal  tenderness. There is no guarding or rebound.  ?Musculoskeletal:     ?   General: Normal range of motion.  ?   Cervical back: Normal range of motion and neck supple. No edema, erythema or rigidity. No muscular tenderness. Normal range of motion.  ?   Comments: Callus on the lateral aspect of the left foot midpoint  ?Lymphadenopathy:  ?   Head:  ?   Right side of head: No submental or submandibular adenopathy.  ?   Left side of head: No submental or submandibular adenopathy.  ?   Cervical: No cervical adenopathy.  ?Skin: ?   General: Skin is warm and dry.  ?   Coloration: Skin is not pale.  ?   Findings: No rash.  ?   Nails: There is no clubbing.  ?Neurological:  ?   Mental Status: She is alert and oriented to person, place, and time.  ?   Sensory: No sensory deficit.  ?Psychiatric:     ?   Mood and Affect: Mood normal.     ?   Speech: Speech normal.     ?   Behavior: Behavior normal.     ?   Thought Content: Thought content normal.     ?   Judgment: Judgment normal.  ? ? ? ?No results found for any visits on 11/28/21. ? ?Last CBC ?Lab Results  ?Component Value Date  ? WBC 5.5 12/08/2020  ? HGB 13.3 12/08/2020  ? HCT 40.1 12/08/2020  ? MCV 90.5 12/08/2020  ? MCH 30.0 12/08/2020  ? RDW 13.8 12/08/2020  ? PLT 240 12/08/2020  ? ?Last metabolic panel ?Lab Results  ?Component Value  Date  ? GLUCOSE 104 (H) 12/08/2020  ? NA 134 (L) 12/08/2020  ? K 4.0 12/08/2020  ? CL 106 12/08/2020  ? CO2 24 12/08/2020  ? BUN 13 12/08/2020  ? CREATININE 0.94 12/08/2020  ? GFRNONAA >60 12/08/2020  ? CALCIUM 7.7

## 2021-11-28 ENCOUNTER — Ambulatory Visit: Payer: Medicaid Other | Attending: Family Medicine | Admitting: Critical Care Medicine

## 2021-11-28 ENCOUNTER — Other Ambulatory Visit: Payer: Self-pay

## 2021-11-28 ENCOUNTER — Encounter: Payer: Self-pay | Admitting: Critical Care Medicine

## 2021-11-28 VITALS — BP 127/85 | HR 73 | Wt 294.8 lb

## 2021-11-28 DIAGNOSIS — Z1211 Encounter for screening for malignant neoplasm of colon: Secondary | ICD-10-CM

## 2021-11-28 DIAGNOSIS — L84 Corns and callosities: Secondary | ICD-10-CM | POA: Diagnosis not present

## 2021-11-28 DIAGNOSIS — Z1159 Encounter for screening for other viral diseases: Secondary | ICD-10-CM

## 2021-11-28 DIAGNOSIS — E66813 Obesity, class 3: Secondary | ICD-10-CM

## 2021-11-28 DIAGNOSIS — D6859 Other primary thrombophilia: Secondary | ICD-10-CM

## 2021-11-28 DIAGNOSIS — Z7901 Long term (current) use of anticoagulants: Secondary | ICD-10-CM | POA: Diagnosis not present

## 2021-11-28 DIAGNOSIS — Z1231 Encounter for screening mammogram for malignant neoplasm of breast: Secondary | ICD-10-CM

## 2021-11-28 DIAGNOSIS — N393 Stress incontinence (female) (male): Secondary | ICD-10-CM | POA: Diagnosis not present

## 2021-11-28 DIAGNOSIS — Z86711 Personal history of pulmonary embolism: Secondary | ICD-10-CM | POA: Diagnosis not present

## 2021-11-28 DIAGNOSIS — Z0000 Encounter for general adult medical examination without abnormal findings: Secondary | ICD-10-CM

## 2021-11-28 DIAGNOSIS — E7800 Pure hypercholesterolemia, unspecified: Secondary | ICD-10-CM

## 2021-11-28 DIAGNOSIS — R739 Hyperglycemia, unspecified: Secondary | ICD-10-CM

## 2021-11-28 DIAGNOSIS — D509 Iron deficiency anemia, unspecified: Secondary | ICD-10-CM

## 2021-11-28 DIAGNOSIS — Z6841 Body Mass Index (BMI) 40.0 and over, adult: Secondary | ICD-10-CM

## 2021-11-28 MED ORDER — WARFARIN SODIUM 5 MG PO TABS
ORAL_TABLET | ORAL | 2 refills | Status: DC
  Filled 2021-11-28: qty 60, 30d supply, fill #0
  Filled 2022-01-04: qty 60, 30d supply, fill #1
  Filled 2022-02-09: qty 60, 30d supply, fill #2
  Filled 2022-03-21: qty 60, 30d supply, fill #3
  Filled 2022-04-25: qty 30, 15d supply, fill #4

## 2021-11-28 MED ORDER — OXYBUTYNIN CHLORIDE 5 MG PO TABS
5.0000 mg | ORAL_TABLET | Freq: Three times a day (TID) | ORAL | 3 refills | Status: DC
  Filled 2021-11-28: qty 60, 20d supply, fill #0

## 2021-11-28 MED ORDER — ATORVASTATIN CALCIUM 20 MG PO TABS
20.0000 mg | ORAL_TABLET | Freq: Every day | ORAL | 6 refills | Status: DC
  Filled 2021-11-28: qty 30, 30d supply, fill #0

## 2021-11-28 NOTE — Assessment & Plan Note (Signed)
Reassess iron levels 

## 2021-11-28 NOTE — Patient Instructions (Addendum)
Start oxybutynin 1 3 times daily as needed for urgency ? ?Urinalysis and complete screening labs will be obtained ? ?Mammogram and colonoscopy referrals were made ? ?Refills on warfarin and atorvastatin were given ? ?Foot clinic referral sent  ? ?Return to see Dr. Margarita Rana in 5 months ? ? ?

## 2021-11-28 NOTE — Assessment & Plan Note (Signed)
Renew atorvastatin ?

## 2021-11-28 NOTE — Assessment & Plan Note (Addendum)
Patient given lifestyle medicine intervention ?Check hemoglobin A1c at this visit ?

## 2021-11-28 NOTE — Assessment & Plan Note (Signed)
Chronic anticoagulation monitor with CBC and iron levels as per pharmacy ?

## 2021-11-28 NOTE — Assessment & Plan Note (Signed)
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 ?Continue warfarin therapy per warfarin clinic refill sent to the pharmacy ? ?Check CBC and iron levels ?

## 2021-11-28 NOTE — Assessment & Plan Note (Signed)
Suspect due to obesity and associated pelvic floor dysfunction ? ?We will give trial of oxybutynin ? ?Check urinalysis ? ?May yet need urology referral ?

## 2021-11-28 NOTE — Assessment & Plan Note (Signed)
Check metabolic panel.  

## 2021-11-29 ENCOUNTER — Encounter: Payer: Self-pay | Admitting: Hematology

## 2021-11-29 LAB — COMPREHENSIVE METABOLIC PANEL
ALT: 15 IU/L (ref 0–32)
AST: 14 IU/L (ref 0–40)
Albumin/Globulin Ratio: 1.2 (ref 1.2–2.2)
Albumin: 3.5 g/dL — ABNORMAL LOW (ref 3.8–4.8)
Alkaline Phosphatase: 73 IU/L (ref 44–121)
BUN/Creatinine Ratio: 18 (ref 9–23)
BUN: 15 mg/dL (ref 6–24)
Bilirubin Total: 0.2 mg/dL (ref 0.0–1.2)
CO2: 21 mmol/L (ref 20–29)
Calcium: 8.6 mg/dL — ABNORMAL LOW (ref 8.7–10.2)
Chloride: 105 mmol/L (ref 96–106)
Creatinine, Ser: 0.85 mg/dL (ref 0.57–1.00)
Globulin, Total: 3 g/dL (ref 1.5–4.5)
Glucose: 101 mg/dL — ABNORMAL HIGH (ref 70–99)
Potassium: 4.5 mmol/L (ref 3.5–5.2)
Sodium: 138 mmol/L (ref 134–144)
Total Protein: 6.5 g/dL (ref 6.0–8.5)
eGFR: 83 mL/min/{1.73_m2} (ref >59)

## 2021-11-29 LAB — IRON,TIBC AND FERRITIN PANEL
Ferritin: 49 ng/mL (ref 15–150)
Iron Saturation: 17 % (ref 15–55)
Iron: 55 ug/dL (ref 27–159)
Total Iron Binding Capacity: 330 ug/dL (ref 250–450)
UIBC: 275 ug/dL (ref 131–425)

## 2021-11-29 LAB — URINALYSIS
Bilirubin, UA: NEGATIVE
Glucose, UA: NEGATIVE
Ketones, UA: NEGATIVE
Leukocytes,UA: NEGATIVE
Nitrite, UA: NEGATIVE
Protein,UA: NEGATIVE
RBC, UA: NEGATIVE
Specific Gravity, UA: 1.024 (ref 1.005–1.030)
Urobilinogen, Ur: 0.2 mg/dL (ref 0.2–1.0)
pH, UA: 6.5 (ref 5.0–7.5)

## 2021-11-29 LAB — CBC WITH DIFFERENTIAL/PLATELET
Basophils Absolute: 0 10*3/uL (ref 0.0–0.2)
Basos: 1 %
EOS (ABSOLUTE): 0.1 10*3/uL (ref 0.0–0.4)
Eos: 2 %
Hematocrit: 44.7 % (ref 34.0–46.6)
Hemoglobin: 14.7 g/dL (ref 11.1–15.9)
Immature Grans (Abs): 0 10*3/uL (ref 0.0–0.1)
Immature Granulocytes: 0 %
Lymphocytes Absolute: 1.7 10*3/uL (ref 0.7–3.1)
Lymphs: 40 %
MCH: 29.6 pg (ref 26.6–33.0)
MCHC: 32.9 g/dL (ref 31.5–35.7)
MCV: 90 fL (ref 79–97)
Monocytes Absolute: 0.4 10*3/uL (ref 0.1–0.9)
Monocytes: 10 %
Neutrophils Absolute: 2.1 10*3/uL (ref 1.4–7.0)
Neutrophils: 47 %
Platelets: 298 10*3/uL (ref 150–450)
RBC: 4.97 x10E6/uL (ref 3.77–5.28)
RDW: 13.8 % (ref 11.7–15.4)
WBC: 4.4 10*3/uL (ref 3.4–10.8)

## 2021-11-29 LAB — HEMOGLOBIN A1C
Est. average glucose Bld gHb Est-mCnc: 108 mg/dL
Hgb A1c MFr Bld: 5.4 % (ref 4.8–5.6)

## 2021-11-29 LAB — HCV AB W REFLEX TO QUANT PCR: HCV Ab: NONREACTIVE

## 2021-11-29 LAB — HCV INTERPRETATION

## 2021-12-02 ENCOUNTER — Other Ambulatory Visit: Payer: Self-pay

## 2021-12-05 ENCOUNTER — Ambulatory Visit: Payer: Medicaid Other | Admitting: Podiatry

## 2021-12-05 ENCOUNTER — Other Ambulatory Visit: Payer: Self-pay

## 2021-12-05 ENCOUNTER — Ambulatory Visit (INDEPENDENT_AMBULATORY_CARE_PROVIDER_SITE_OTHER): Payer: Medicaid Other

## 2021-12-05 DIAGNOSIS — M79671 Pain in right foot: Secondary | ICD-10-CM

## 2021-12-05 DIAGNOSIS — M7750 Other enthesopathy of unspecified foot: Secondary | ICD-10-CM | POA: Diagnosis not present

## 2021-12-05 DIAGNOSIS — G5761 Lesion of plantar nerve, right lower limb: Secondary | ICD-10-CM

## 2021-12-05 DIAGNOSIS — M779 Enthesopathy, unspecified: Secondary | ICD-10-CM

## 2021-12-05 MED ORDER — METHYLPREDNISOLONE 4 MG PO TBPK
ORAL_TABLET | ORAL | 0 refills | Status: DC
  Filled 2021-12-05: qty 21, 6d supply, fill #0

## 2021-12-05 NOTE — Patient Instructions (Signed)

## 2021-12-07 ENCOUNTER — Other Ambulatory Visit: Payer: Self-pay | Admitting: Podiatry

## 2021-12-07 DIAGNOSIS — M779 Enthesopathy, unspecified: Secondary | ICD-10-CM

## 2021-12-07 NOTE — Progress Notes (Signed)
Subjective:   Patient ID: Donna [name], female   DOB: 51 y.o.   MRN: [id number]   HPI 51 year old female presents the office today for concerns of recurrent pain in the right foot.  She previously had injection with Dr. March Rummage and has done well from this but the pain started to come back.  Also more recently she started to develop burning, tingling on top of her left foot over last 1 to 2 weeks without any recent injury.  Denies any cramping in her legs.  No open lesions.  No injuries.  No other concerns.  Has a history of stepping on a blade in 2021 on her right foot.  Since then she has had some nerve symptoms on this area.  Review of Systems  All other systems reviewed and are negative.  Past Medical History:  Diagnosis Date   DVT (deep venous thrombosis) ([facility]) 12/06/2012   Lung collapse    PONV (postoperative nausea and vomiting)    Pulmonary embolism affecting pregnancy     Past Surgical History:  Procedure Laterality Date   HYSTERECTOMY ABDOMINAL WITH SALPINGECTOMY Bilateral 07/15/2019   Procedure: HYSTERECTOMY ABDOMINAL WITH SALPINGECTOMY;  Surgeon: [name], MD;  Location: [facility];  Service: Gynecology;  Laterality: Bilateral;   RIB RESECTION       Current Outpatient Medications:    methylPREDNISolone (MEDROL DOSEPAK) 4 MG TBPK tablet, Take as directed, Disp: 21 tablet, Rfl: 0   acetaminophen (TYLENOL) 500 MG tablet, Take 1,000 mg by mouth every 6 (six) hours as needed for mild pain., Disp: , Rfl:    atorvastatin (LIPITOR) 20 MG tablet, Take 1 tablet (20 mg total) by mouth daily., Disp: 30 tablet, Rfl: 6   oxybutynin (DITROPAN) 5 MG tablet, Take 1 tablet (5 mg total) by mouth 3 (three) times daily., Disp: 60 tablet, Rfl: 3   warfarin (COUMADIN) 5 MG tablet, TAKE 1 TO 2 TABLETS BY MOUTH DAILY AS DIRECTED BY THE COUMADIN CLINIC., Disp: 90 tablet, Rfl: 2  Allergies  Allergen Reactions   Nsaids Other (See Comments)    ON COUMADIN          Objective:  Physical  Exam  General: AAO x3, NAD  Dermatological: Skin is warm, dry and supple bilateral. There are no open sores, no preulcerative lesions, no rash or signs of infection present.  Vascular: Dorsalis Pedis artery and Posterior Tibial artery pedal pulses are 2/4 bilateral with immedate capillary fill time. There is no pain with calf compression, swelling, warmth, erythema.   Neruologic: Grossly intact via light touch bilateral.  Negative Tinel sign.  Musculoskeletal: There is tenderness on the right third interspace and small palpable neuroma is identified.  There is no area of pinpoint tenderness.  On the left foot there is diffuse numbness, tingling and burning the top of her foot.  There is negative Tinel's sign.  She does get some tenderness on the extensor tendons on the dorsal aspect the foot as well.  Muscular strength 5/5 in all groups tested bilateral.  Gait: Unassisted, Nonantalgic.       Assessment:   Right foot neuroma, left foot neuritis     Plan:  -Treatment options discussed including all alternatives, risks, and complications -Etiology of symptoms were discussed -X-rays were obtained and reviewed with the patient.  3 views of bilateral feet were obtained.  There is no evidence of acute fracture. -On the right foot steroid injection was performed.  Skin was cleaned with alcohol and mixture of 0.5 cc  of Marcaine plain, 0.5 cc of dexamethasone phosphate was infiltrated into the third interspace on the area of maximal tenderness without complications.  Postinjection care discussed. -I held on steroid injection of the left foot.  We discussed injection but is more diffuse.  We could not inject in the superficial peroneal nerve.  Discussed injecting around the tendons and this could cause tendon rupture and she wants to hold off on this.  Prescribed Medrol Dosepak. -If no improvement may need to consider nerve conduction test.  Return in about 4 weeks (around 01/02/2022), or if symptoms  worsen or fail to improve.  Trula Slade DPM

## 2021-12-08 ENCOUNTER — Ambulatory Visit: Payer: Medicaid Other | Admitting: Critical Care Medicine

## 2021-12-14 ENCOUNTER — Ambulatory Visit
Admission: RE | Admit: 2021-12-14 | Discharge: 2021-12-14 | Disposition: A | Discharge status: Home / Self Care | Payer: Medicaid Other | Source: Ambulatory Visit | Attending: Critical Care Medicine | Admitting: Critical Care Medicine

## 2021-12-14 DIAGNOSIS — Z1231 Encounter for screening mammogram for malignant neoplasm of breast: Secondary | ICD-10-CM

## 2021-12-16 ENCOUNTER — Telehealth: Payer: Self-pay

## 2021-12-16 NOTE — Telephone Encounter (Signed)
-----   Message from Elsie Stain, MD sent at 12/15/2021  4:22 PM EDT ----- Let patient know mammogram was normal

## 2021-12-16 NOTE — Telephone Encounter (Signed)
Pt was called and vm was left, Information has been sent to nurse pool.   

## 2022-01-04 ENCOUNTER — Other Ambulatory Visit: Payer: Self-pay

## 2022-01-06 ENCOUNTER — Telehealth: Payer: Self-pay | Admitting: Family Medicine

## 2022-01-06 NOTE — Telephone Encounter (Signed)
Daphne calling [facility] - calling for clearance for the pt to stop her blood Thinners for a procedure on Monday. [phone]

## 2022-01-09 ENCOUNTER — Telehealth: Payer: Self-pay | Admitting: Family Medicine

## 2022-02-10 ENCOUNTER — Other Ambulatory Visit: Payer: Self-pay

## 2022-02-14 ENCOUNTER — Other Ambulatory Visit: Payer: Self-pay

## 2022-03-02 ENCOUNTER — Ambulatory Visit: Payer: Medicaid Other | Attending: Family Medicine | Admitting: Pharmacist

## 2022-03-02 DIAGNOSIS — Z7901 Long term (current) use of anticoagulants: Secondary | ICD-10-CM

## 2022-03-02 LAB — POCT INR: INR: 3.4 — AB (ref 2.0–3.0)

## 2022-03-22 ENCOUNTER — Other Ambulatory Visit: Payer: Self-pay

## 2022-04-06 ENCOUNTER — Ambulatory Visit: Payer: Medicaid Other | Attending: Family Medicine | Admitting: Pharmacist

## 2022-04-06 DIAGNOSIS — Z7901 Long term (current) use of anticoagulants: Secondary | ICD-10-CM | POA: Diagnosis not present

## 2022-04-06 LAB — POCT INR: INR: 2 (ref 2.0–3.0)

## 2022-04-26 ENCOUNTER — Other Ambulatory Visit: Payer: Self-pay

## 2022-04-27 ENCOUNTER — Other Ambulatory Visit: Payer: Self-pay

## 2022-05-01 ENCOUNTER — Encounter: Payer: Self-pay | Admitting: Family Medicine

## 2022-05-01 ENCOUNTER — Ambulatory Visit: Payer: Medicaid Other | Attending: Family Medicine | Admitting: Family Medicine

## 2022-05-01 ENCOUNTER — Other Ambulatory Visit: Payer: Self-pay | Admitting: Pharmacist

## 2022-05-01 ENCOUNTER — Other Ambulatory Visit: Payer: Self-pay

## 2022-05-01 VITALS — BP 132/85 | HR 76 | Ht 70.0 in | Wt 298.2 lb

## 2022-05-01 DIAGNOSIS — N3281 Overactive bladder: Secondary | ICD-10-CM | POA: Diagnosis not present

## 2022-05-01 DIAGNOSIS — Z1211 Encounter for screening for malignant neoplasm of colon: Secondary | ICD-10-CM | POA: Diagnosis not present

## 2022-05-01 DIAGNOSIS — Z13220 Encounter for screening for lipoid disorders: Secondary | ICD-10-CM

## 2022-05-01 DIAGNOSIS — Z136 Encounter for screening for cardiovascular disorders: Secondary | ICD-10-CM

## 2022-05-01 DIAGNOSIS — Z86711 Personal history of pulmonary embolism: Secondary | ICD-10-CM

## 2022-05-01 DIAGNOSIS — R030 Elevated blood-pressure reading, without diagnosis of hypertension: Secondary | ICD-10-CM

## 2022-05-01 DIAGNOSIS — Z13228 Encounter for screening for other metabolic disorders: Secondary | ICD-10-CM

## 2022-05-01 MED ORDER — ENOXAPARIN SODIUM 100 MG/ML IJ SOSY
1.0000 mg/kg | PREFILLED_SYRINGE | Freq: Two times a day (BID) | INTRAMUSCULAR | 0 refills | Status: DC
  Filled 2022-05-01: qty 30, 11d supply, fill #0

## 2022-05-01 MED ORDER — OXYBUTYNIN CHLORIDE 5 MG PO TABS
5.0000 mg | ORAL_TABLET | Freq: Three times a day (TID) | ORAL | 3 refills | Status: DC
  Filled 2022-05-01: qty 60, 20d supply, fill #0

## 2022-05-01 MED ORDER — ENOXAPARIN SODIUM 150 MG/ML IJ SOSY
1.0000 mg/kg | PREFILLED_SYRINGE | Freq: Two times a day (BID) | INTRAMUSCULAR | 3 refills | Status: DC
Start: 2022-05-01 — End: 2022-10-04
  Filled 2022-05-01: qty 20, 10d supply, fill #0

## 2022-05-01 NOTE — Patient Instructions (Addendum)
Coumadin instructions: Hold Coumadin 5 days prior to extraction (last dose 05/07/22) Start Lovenox(05/08/22) once Coumadin is discontinued twice daily with the last dose 24 hours prior to your procedure (05/11/22 morning) Resume your Lovenox twice daily and Coumadin once daily 24 hours after your procedure (05/13/22) Keep your appointment to have your INR checked with the clinical pharmacist so Lovenox can be discontinued.

## 2022-05-01 NOTE — Progress Notes (Signed)
Subjective:  Patient ID: Donna [name], female    DOB: Feb 18, 1971  Age: 51 y.o. MRN: [id number]  CC: Hypertension   HPI MIYAKO [name] is a 52 y.o. 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 Her current dose of Coumadin is 5 mg on Monday and Friday and 10 mg on other days.  At her last visit with me, in 07/2020 she had discussed losing her daughter.  I had referred her to grief counseling but she states she is not ready for that yet.  States she is doing better and taking it day by day. 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  She has had urinary incontinence mostly urgency and was prescribed oxybutynin by Dr. Joya Gaskins but has not been adherent with it. Past Medical History:  Diagnosis Date   DVT (deep venous thrombosis) ([facility]) 12/06/2012   Lung collapse    PONV (postoperative nausea and vomiting)    Pulmonary embolism affecting pregnancy     Past Surgical History:  Procedure Laterality Date   HYSTERECTOMY ABDOMINAL WITH SALPINGECTOMY Bilateral 07/15/2019   Procedure: HYSTERECTOMY ABDOMINAL WITH SALPINGECTOMY;  Surgeon: [name], MD;  Location: [facility];  Service: Gynecology;  Laterality: Bilateral;   RIB RESECTION      Family History  Problem Relation Age of Onset   COPD Father    Stroke Father     Social History   Socioeconomic History   Marital status: Single    Spouse name: Not on file   Number of children: Not on file   Years of education: Not on file    Highest education level: Not on file  Occupational History   Not on file  Tobacco Use   Smoking status: Never   Smokeless tobacco: Never  Vaping Use   Vaping Use: Never used  Substance and Sexual Activity   Alcohol use: Yes    Comment: socially    Drug use: No   Sexual activity: Not on file  Other Topics Concern   Not on file  Social History Narrative   Not on file   Social Determinants of Health   Financial Resource Strain: Not on file  Food Insecurity: Not on file  Transportation Needs: Not on file  Physical Activity: Not on file  Stress: Not on file  Social Connections: Not on file    Allergies  Allergen Reactions   Nsaids Other (See Comments)    ON COUMADIN    Outpatient Medications Prior to Visit  Medication Sig Dispense Refill   oxybutynin (DITROPAN) 5 MG tablet Take 1 tablet (5 mg total) by mouth 3 (three) times daily. 60 tablet 3   warfarin (COUMADIN) 5 MG tablet TAKE 1 TO 2 TABLETS BY MOUTH DAILY AS DIRECTED BY THE COUMADIN CLINIC. 90 tablet 2   acetaminophen (TYLENOL) 500 MG tablet Take 1,000 mg by mouth every 6 (six) hours as needed for mild pain. (Patient not taking: Reported on 05/01/2022)     atorvastatin (LIPITOR) 20 MG tablet Take 1 tablet (20 mg total) by mouth daily. (Patient not taking: Reported on  05/01/2022) 30 tablet 6   methylPREDNISolone (MEDROL DOSEPAK) 4 MG TBPK tablet Take as directed (Patient not taking: Reported on 05/01/2022) 21 tablet 0   No facility-administered medications prior to visit.     ROS Review of Systems  Constitutional:  Negative for activity change and appetite change.  HENT:  Negative for sinus pressure and sore throat.   Respiratory:  Negative for chest tightness, shortness of breath and wheezing.   Cardiovascular:  Negative for chest pain and palpitations.  Gastrointestinal:  Negative for abdominal distention, abdominal pain and constipation.  Genitourinary: Negative.   Musculoskeletal: Negative.    Psychiatric/Behavioral:  Negative for behavioral problems and dysphoric mood.     Objective:  BP (!) 131/97   Pulse 76   Ht '5\' 10"'  (1.778 m)   Wt 298 lb 3.2 oz (135.3 kg)   LMP 07/05/2019 (Exact Date)   SpO2 95%   BMI 42.79 kg/m      05/01/2022    8:50 AM 11/28/2021    8:54 AM 10/04/2021   12:25 PM  BP/Weight  Systolic BP 381 771 165  Diastolic BP 97 85 86  Wt. (Lbs) 298.2 294.8   BMI 42.79 kg/m2 42.3 kg/m2       Physical Exam Constitutional:      Appearance: She is well-developed.  Cardiovascular:     Rate and Rhythm: Normal rate.     Heart sounds: Normal heart sounds. No murmur heard. Pulmonary:     Effort: Pulmonary effort is normal.     Breath sounds: Normal breath sounds. No wheezing or rales.  Chest:     Chest wall: No tenderness.  Abdominal:     General: Bowel sounds are normal. There is no distension.     Palpations: Abdomen is soft. There is no mass.     Tenderness: There is no abdominal tenderness.  Musculoskeletal:        General: Normal range of motion.     Right lower leg: No edema.     Left lower leg: No edema.  Neurological:     Mental Status: She is alert and oriented to person, place, and time.  Psychiatric:        Mood and Affect: Mood normal.        Latest Ref Rng & Units 11/28/2021    9:21 AM 12/08/2020   10:11 AM 08/30/2020    1:40 PM  CMP  Glucose 70 - 99 mg/dL 101  104  97   BUN 6 - 24 mg/dL '15  13  10   ' Creatinine 0.57 - 1.00 mg/dL 0.85  0.94  0.79   Sodium 134 - 144 mmol/L 138  134  137   Potassium 3.5 - 5.2 mmol/L 4.5  4.0  3.8   Chloride 96 - 106 mmol/L 105  106  109   CO2 20 - 29 mmol/L '21  24  23   ' Calcium 8.7 - 10.2 mg/dL 8.6  7.7  8.0   Total Protein 6.0 - 8.5 g/dL 6.5   6.1   Total Bilirubin 0.0 - 1.2 mg/dL 0.2   0.3   Alkaline Phos 44 - 121 IU/L 73   61   AST 0 - 40 IU/L 14   11   ALT 0 - 32 IU/L 15   14     Lipid Panel     Component Value Date/Time   CHOL 216 (H) 03/18/2019 1609   TRIG 193 (H) 03/18/2019 1609    HDL 47 03/18/2019 1609  CHOLHDL 4.6 (H) 03/18/2019 1609   CHOLHDL 5.3 05/16/2013 0925   VLDL 36 05/16/2013 0925   LDLCALC 135 (H) 03/18/2019 1609    CBC    Component Value Date/Time   WBC 4.4 11/28/2021 0921   WBC 5.5 12/08/2020 1011   RBC 4.97 11/28/2021 0921   RBC 4.43 12/08/2020 1011   HGB 14.7 11/28/2021 0921   HGB 12.3 07/11/2017 1209   HCT 44.7 11/28/2021 0921   HCT 38.5 07/11/2017 1209   PLT 298 11/28/2021 0921   MCV 90 11/28/2021 0921   MCV 84.2 07/11/2017 1209   MCH 29.6 11/28/2021 0921   MCH 30.0 12/08/2020 1011   MCHC 32.9 11/28/2021 0921   MCHC 33.2 12/08/2020 1011   RDW 13.8 11/28/2021 0921   RDW 16.7 (H) 07/11/2017 1209   LYMPHSABS 1.7 11/28/2021 0921   LYMPHSABS 2.0 07/11/2017 1209   MONOABS 0.5 12/08/2020 1011   MONOABS 0.4 07/11/2017 1209   EOSABS 0.1 11/28/2021 0921   BASOSABS 0.0 11/28/2021 0921   BASOSABS 0.0 07/11/2017 1209    Lab Results  Component Value Date   HGBA1C 5.4 11/28/2021    Lab Results  Component Value Date   INR 2.0 04/06/2022   INR 3.4 (A) 03/02/2022   INR 2.0 08/18/2021    Assessment & Plan:  1. History of pulmonary embolism Last INR was therapeutic at 2.0, 1 month ago. In anticipation of tooth extraction on 05/12/2022, I have provided her with instructions as follows: Coumadin instructions: Hold Coumadin 5 days prior to extraction (last dose 05/07/22) Start Lovenox(05/08/22) once Coumadin is discontinued twice daily with the last dose 24 hours prior to your procedure (05/11/22 morning) Resume your Lovenox twice daily and Coumadin once daily 24 hours after your procedure (05/13/22) Keep your appointment to have your INR checked with the clinical pharmacist so Lovenox can be discontinued. - enoxaparin (LOVENOX) 100 MG/ML injection; Inject 1.35 mLs (135 mg total) into the skin every 12 (twelve) hours.  Dispense: 30 mL; Refill: 0  2. Encounter for lipid screening for cardiovascular disease - LP+Non-HDL Cholesterol  3.  Screening for metabolic disorder - [id number]+FTDD  4. Screening for colon cancer - Ambulatory referral to Gastroenterology  5. Elevated blood pressure reading in office without diagnosis of hypertension Counseled on blood pressure goal of less than 130/80, low-sodium, DASH diet, medication compliance, 150 minutes of moderate intensity exercise per week. Discussed medication compliance, adverse effects.  6. Overactive bladder Uncontrolled due to medication adherence which we have discussed and she promises to restart medications. - oxybutynin (DITROPAN) 5 MG tablet; Take 1 tablet (5 mg total) by mouth 3 (three) times daily.  Dispense: 60 tablet; Refill: 3   Health Care Maintenance: Declines Shingrix No orders of the defined types were placed in this encounter.   Follow-up: Return in about 17 days (around 05/18/2022) for INR check with Lurena Joiner, Medical conditions with PCP in 6 months.       Charlott Rakes, MD, FAAFP. [facility], [place]   05/01/2022, 9:08 AM

## 2022-05-02 ENCOUNTER — Other Ambulatory Visit: Payer: Self-pay

## 2022-05-02 LAB — CMP14+EGFR
ALT: 14 IU/L (ref 0–32)
AST: 18 IU/L (ref 0–40)
Albumin/Globulin Ratio: 1.3 (ref 1.2–2.2)
Albumin: 3.4 g/dL — ABNORMAL LOW (ref 3.8–4.9)
Alkaline Phosphatase: 76 IU/L (ref 44–121)
BUN/Creatinine Ratio: 15 (ref 9–23)
BUN: 13 mg/dL (ref 6–24)
Bilirubin Total: <0.2 mg/dL (ref 0.0–1.2)
CO2: 22 mmol/L (ref 20–29)
Calcium: 8.5 mg/dL — ABNORMAL LOW (ref 8.7–10.2)
Chloride: 104 mmol/L (ref 96–106)
Creatinine, Ser: 0.89 mg/dL (ref 0.57–1.00)
Globulin, Total: 2.7 g/dL (ref 1.5–4.5)
Glucose: 97 mg/dL (ref 70–99)
Potassium: 4.2 mmol/L (ref 3.5–5.2)
Sodium: 137 mmol/L (ref 134–144)
Total Protein: 6.1 g/dL (ref 6.0–8.5)
eGFR: 78 mL/min/{1.73_m2} (ref >59)

## 2022-05-02 LAB — LP+NON-HDL CHOLESTEROL
Cholesterol, Total: 229 mg/dL — ABNORMAL HIGH (ref 100–199)
HDL: 41 mg/dL (ref >39)
LDL Chol Calc (NIH): 159 mg/dL — ABNORMAL HIGH (ref 0–99)
Total Non-HDL-Chol (LDL+VLDL): 188 mg/dL — ABNORMAL HIGH (ref 0–129)
Triglycerides: 156 mg/dL — ABNORMAL HIGH (ref 0–149)
VLDL Cholesterol Cal: 29 mg/dL (ref 5–40)

## 2022-05-03 ENCOUNTER — Other Ambulatory Visit: Payer: Self-pay

## 2022-05-08 ENCOUNTER — Ambulatory Visit: Payer: Medicaid Other | Attending: Family Medicine | Admitting: Pharmacist

## 2022-05-08 DIAGNOSIS — I824Y1 Acute embolism and thrombosis of unspecified deep veins of right proximal lower extremity: Secondary | ICD-10-CM

## 2022-05-08 DIAGNOSIS — Z7901 Long term (current) use of anticoagulants: Secondary | ICD-10-CM

## 2022-05-08 LAB — POCT INR: INR: 2 (ref 2.0–3.0)

## 2022-05-16 ENCOUNTER — Encounter: Payer: Self-pay | Admitting: Hematology

## 2022-05-17 ENCOUNTER — Other Ambulatory Visit: Payer: Self-pay | Admitting: Critical Care Medicine

## 2022-05-18 ENCOUNTER — Other Ambulatory Visit: Payer: Self-pay

## 2022-05-18 MED ORDER — WARFARIN SODIUM 5 MG PO TABS
ORAL_TABLET | ORAL | 2 refills | Status: DC
  Filled 2022-05-18: qty 60, 30d supply, fill #0
  Filled 2022-06-20: qty 60, 30d supply, fill #1
  Filled 2022-07-27: qty 60, 30d supply, fill #2
  Filled 2022-08-29: qty 60, 30d supply, fill #3
  Filled 2022-10-04: qty 30, 15d supply, fill #4

## 2022-05-18 NOTE — Telephone Encounter (Signed)
Requested medication (s) are due for refill today:   Provider to review for Warfarin dose  Requested medication (s) are on the active medication list:   Yes  Future visit scheduled:   Yes   Last ordered: 11/28/2021 #90, 2 refills  Returned for provider review due to dosing.      Requested Prescriptions  Pending Prescriptions Disp Refills   warfarin (COUMADIN) 5 MG tablet 90 tablet 2    Sig: TAKE 1 TO 2 TABLETS BY MOUTH DAILY AS DIRECTED BY THE COUMADIN CLINIC.     Hematology:  Anticoagulants - warfarin Failed - 05/17/2022  7:57 PM      Failed - Manual Review: If patient's warfarin is managed by Anti-Coag team, route request to them. If not, route request to the provider.      Passed - INR in normal range and within 30 days    INR  Date Value Ref Range Status  05/08/2022 2.0 2.0 - 3.0 Final  12/08/2020 2.1 (H) 0.8 - 1.2 Final    Comment:    (NOTE) INR goal varies based on device and disease states. Performed at [facility], [address] [place], [place] [address]          Passed - HCT in normal range and within 360 days    HCT  Date Value Ref Range Status  07/11/2017 38.5 34.8 - 46.6 % Final   Hematocrit  Date Value Ref Range Status  11/28/2021 44.7 34.0 - 46.6 % Final         Passed - Patient is not pregnant      Passed - Valid encounter within last 3 months    Recent Outpatient Visits           2 weeks ago History of pulmonary embolism   [place], Charlane Ferretti, MD   5 months ago Hypercoagulable state [facility])   [place] Elsie Stain, MD   1 year ago Chronic anticoagulation   [place], RPH-CPP   1 year ago Screening for colon cancer   [place], MD   3 years ago Pure hypercholesterolemia   [place], [place], MD       Future  Appointments             Tomorrow Daisy Blossom, Jarome Matin, [place]   In 5 months Charlott Rakes, MD [place]

## 2022-05-19 ENCOUNTER — Ambulatory Visit: Payer: Medicaid Other | Attending: Family Medicine | Admitting: Pharmacist

## 2022-05-19 DIAGNOSIS — Z7901 Long term (current) use of anticoagulants: Secondary | ICD-10-CM

## 2022-05-19 LAB — POCT INR: INR: 1.7 — AB (ref 2.0–3.0)

## 2022-06-20 ENCOUNTER — Other Ambulatory Visit: Payer: Self-pay

## 2022-06-22 ENCOUNTER — Other Ambulatory Visit: Payer: Self-pay

## 2022-06-22 ENCOUNTER — Ambulatory Visit: Payer: Medicaid Other | Attending: Family Medicine | Admitting: Pharmacist

## 2022-06-22 DIAGNOSIS — Z7901 Long term (current) use of anticoagulants: Secondary | ICD-10-CM

## 2022-06-22 DIAGNOSIS — I824Y1 Acute embolism and thrombosis of unspecified deep veins of right proximal lower extremity: Secondary | ICD-10-CM | POA: Diagnosis not present

## 2022-06-22 LAB — POCT INR: INR: 1.8 — AB (ref 2.0–3.0)

## 2022-06-23 ENCOUNTER — Other Ambulatory Visit: Payer: Self-pay

## 2022-07-28 ENCOUNTER — Ambulatory Visit: Payer: Medicaid Other | Attending: Family Medicine | Admitting: Pharmacist

## 2022-07-28 ENCOUNTER — Other Ambulatory Visit: Payer: Self-pay

## 2022-07-28 DIAGNOSIS — Z7901 Long term (current) use of anticoagulants: Secondary | ICD-10-CM | POA: Diagnosis not present

## 2022-07-28 DIAGNOSIS — I824Y1 Acute embolism and thrombosis of unspecified deep veins of right proximal lower extremity: Secondary | ICD-10-CM | POA: Diagnosis not present

## 2022-07-28 LAB — POCT INR: INR: 1.7 — AB (ref 2.0–3.0)

## 2022-08-01 ENCOUNTER — Other Ambulatory Visit: Payer: Self-pay

## 2022-08-30 ENCOUNTER — Other Ambulatory Visit: Payer: Self-pay

## 2022-09-01 ENCOUNTER — Other Ambulatory Visit: Payer: Self-pay

## 2022-09-05 ENCOUNTER — Ambulatory Visit: Payer: Medicaid Other | Attending: Family Medicine | Admitting: Pharmacist

## 2022-09-05 ENCOUNTER — Telehealth: Payer: Self-pay | Admitting: Podiatry

## 2022-09-05 DIAGNOSIS — Z7901 Long term (current) use of anticoagulants: Secondary | ICD-10-CM | POA: Diagnosis not present

## 2022-09-05 LAB — POCT INR: POC INR: 2.5

## 2022-09-05 NOTE — Telephone Encounter (Signed)
Patient called and stated her foot is in severe pain and she is scheduled to see Dr. Jacqualyn Posey on 09/25/2022 but she will need something to control the pain until her next appointment.

## 2022-09-08 ENCOUNTER — Other Ambulatory Visit: Payer: Self-pay

## 2022-09-08 DIAGNOSIS — G629 Polyneuropathy, unspecified: Secondary | ICD-10-CM | POA: Diagnosis not present

## 2022-09-08 MED ORDER — GABAPENTIN 300 MG PO CAPS
300.0000 mg | ORAL_CAPSULE | Freq: Every day | ORAL | 0 refills | Status: DC
  Filled 2022-09-08: qty 10, 10d supply, fill #0

## 2022-09-11 ENCOUNTER — Ambulatory Visit (INDEPENDENT_AMBULATORY_CARE_PROVIDER_SITE_OTHER): Payer: Medicaid Other | Admitting: Podiatry

## 2022-09-11 ENCOUNTER — Other Ambulatory Visit: Payer: Self-pay

## 2022-09-11 DIAGNOSIS — M7750 Other enthesopathy of unspecified foot: Secondary | ICD-10-CM

## 2022-09-11 DIAGNOSIS — G5761 Lesion of plantar nerve, right lower limb: Secondary | ICD-10-CM

## 2022-09-11 MED ORDER — METHYLPREDNISOLONE 4 MG PO TBPK
ORAL_TABLET | ORAL | 0 refills | Status: DC
  Filled 2022-09-11: qty 21, 6d supply, fill #0

## 2022-09-11 NOTE — Progress Notes (Signed)
I connected with  Donna [name] on 09/11/22 by a video enabled telemedicine application and verified that I am speaking with the correct person using two identifiers.   I discussed the limitations of evaluation and management by telemedicine. The patient expressed understanding and agreed to proceed.  Patient: Home Provider: Office   51 year old female states that she started having recurrence of pain on the right foot on Friday.  She previously was on steroids which was beneficial for her.  She does not recall any recent injury.  No increase swelling.  No radiating pain.  Discussion I did call in Medrol Dosepak.  Follow-up as scheduled or sooner if needed.  Trula Slade DPM

## 2022-09-12 ENCOUNTER — Other Ambulatory Visit: Payer: Self-pay

## 2022-09-25 ENCOUNTER — Ambulatory Visit (INDEPENDENT_AMBULATORY_CARE_PROVIDER_SITE_OTHER): Payer: Medicaid Other

## 2022-09-25 ENCOUNTER — Other Ambulatory Visit: Payer: Self-pay

## 2022-09-25 ENCOUNTER — Ambulatory Visit: Payer: Medicaid Other | Admitting: Podiatry

## 2022-09-25 DIAGNOSIS — M722 Plantar fascial fibromatosis: Secondary | ICD-10-CM

## 2022-09-25 DIAGNOSIS — G629 Polyneuropathy, unspecified: Secondary | ICD-10-CM | POA: Diagnosis not present

## 2022-09-25 DIAGNOSIS — M79671 Pain in right foot: Secondary | ICD-10-CM

## 2022-09-25 DIAGNOSIS — M79672 Pain in left foot: Secondary | ICD-10-CM | POA: Diagnosis not present

## 2022-09-25 MED ORDER — GABAPENTIN 300 MG PO CAPS
300.0000 mg | ORAL_CAPSULE | Freq: Every day | ORAL | 0 refills | Status: AC
  Filled 2022-09-25: qty 90, 90d supply, fill #0

## 2022-09-25 NOTE — Patient Instructions (Signed)

## 2022-09-25 NOTE — Progress Notes (Signed)
Subjective: Chief Complaint  Patient presents with   Foot Pain    Pain in the right foot came after an accident, the left foot pain cme about 1 yr ago. Pain in the left foot is located at the top of the foot, feels as though being shocked. Right foot pain is from toes and radiate to the heel    52 year old female presents the office with above concerns.  She is above.  Area is much as she is on steroids but the pain has been coming back some.  It fees like she is getting "shocked" and it is on the top of the foot and goes above the ankle. No injuries she reports.   The right side was previously from an accident when a nail went throughtthe foot.  She has painful parts of the foot.  Objective: AAO x3, NAD DP/PT pulses palpable bilaterally, CRT less than 3 seconds On the right side there is tenderness palpation of the occipital and medial band of plantar fascia.  There is no edema, erythema, puncture wound or any signs of infection.  There is no area pinpoint tenderness. On the left side there is a positive Tinel's sign along course of the superficial peroneal nerve.  Even with light sensation rubbing the top of the foot she gets numbness, tingling to the top of the foot going into the toes. No pain with calf compression, swelling, warmth, erythema  Assessment: Neuritis left foot, tendinitis right side; concern for neuropathy  Plan: -All treatment options discussed with the patient including all alternatives, risks, complications.  -Given ongoing nature of her symptoms in order nerve conduction test. -We discussed replacing her booster avoid any pressure on the nerves. -Discussed stretching, ice as well as shoes and good arch support particularly for the right side. -MRI right if no improvement -Patient encouraged to call the office with any questions, concerns, change in symptoms.   Trula Slade DPM

## 2022-09-26 ENCOUNTER — Ambulatory Visit: Payer: Medicaid Other | Admitting: Family Medicine

## 2022-10-04 ENCOUNTER — Encounter: Payer: Self-pay | Admitting: Nurse Practitioner

## 2022-10-04 ENCOUNTER — Ambulatory Visit: Payer: Medicaid Other | Attending: Nurse Practitioner | Admitting: Nurse Practitioner

## 2022-10-04 ENCOUNTER — Other Ambulatory Visit: Payer: Self-pay

## 2022-10-04 VITALS — BP 118/84 | HR 84 | Ht 70.0 in | Wt 292.6 lb

## 2022-10-04 DIAGNOSIS — I8392 Asymptomatic varicose veins of left lower extremity: Secondary | ICD-10-CM | POA: Diagnosis not present

## 2022-10-04 NOTE — Progress Notes (Signed)
Upper leg and calf knots. First noticed 3 months . No swelling or redness.

## 2022-10-04 NOTE — Progress Notes (Signed)
Assessment & Plan:  Donna [name] was seen today for varicose veins.  Diagnoses and all orders for this visit:  Asymptomatic varicose veins of left lower extremity Recommend full compression stockings Weight loss She will call if becomes symptomatic   Patient has been counseled on age-appropriate routine health concerns for screening and prevention. These are reviewed and up-to-date. Referrals have been placed accordingly. Immunizations are up-to-date or declined.    Subjective:   Chief Complaint  Patient presents with   Varicose Veins   [name] 52 y.o. female presents to office today for evaluation of asymptomatic LLE varicose veins. Wants to know if she needs treatment for them and what they are. They are not causing her any pain or discomfort. No associated swelling.      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  BP Readings from Last 3 Encounters:  10/04/22 118/84  05/01/22 132/85  11/28/21 127/85   Lab Results  Component Value Date   HGBA1C 5.4 11/28/2021    Lab Results  Component Value Date   HGBA1C 5.4 11/28/2021      Review of Systems  Constitutional:  Negative for fever, malaise/fatigue and weight loss.  HENT: Negative.  Negative for nosebleeds.   Eyes: Negative.  Negative for blurred vision, double vision and photophobia.  Respiratory: Negative.  Negative for cough and shortness of breath.   Cardiovascular: Negative.  Negative for chest pain, palpitations and leg swelling.  Gastrointestinal: Negative.  Negative for heartburn, nausea and vomiting.  Musculoskeletal: Negative.  Negative for myalgias.  Skin:         SEE HPI  Neurological: Negative.  Negative for dizziness, focal weakness, seizures and headaches.  Psychiatric/Behavioral: Negative.  Negative for suicidal ideas.     Past Medical History:  Diagnosis Date   DVT (deep venous thrombosis) ([place]) 12/06/2012   Lung collapse    PONV (postoperative nausea and vomiting)    Pulmonary embolism affecting pregnancy     Past Surgical History:  Procedure Laterality Date   HYSTERECTOMY ABDOMINAL WITH SALPINGECTOMY Bilateral 07/15/2019   Procedure: HYSTERECTOMY ABDOMINAL WITH SALPINGECTOMY;  Surgeon: [name], MD;  Location: [facility];  Service: Gynecology;  Laterality: Bilateral;   RIB RESECTION      Family History  Problem Relation Age of Onset   COPD Father    Stroke Father     Social History Reviewed with no changes to be made today.   Outpatient Medications Prior to Visit  Medication Sig Dispense Refill   acetaminophen (TYLENOL) 500 MG tablet Take 1,000 mg by mouth every 6 (six) hours as needed for mild pain.     gabapentin (NEURONTIN) 300 MG capsule Take 1 capsule (300 mg total) by mouth at bedtime. 90 capsule 0   warfarin (COUMADIN) 5 MG tablet TAKE 1 TO 2 TABLETS BY MOUTH DAILY AS DIRECTED BY THE COUMADIN CLINIC. 90 tablet 2   atorvastatin (LIPITOR) 20 MG tablet Take 1 tablet (20 mg total) by mouth daily. (Patient not taking: Reported on 10/04/2022) 30 tablet 6   enoxaparin (LOVENOX) 150 MG/ML injection Inject 0.9 mLs (135 mg total) into the skin every 12 (twelve) hours. (Patient not taking: Reported on 10/04/2022) 20 mL 3   methylPREDNISolone (MEDROL DOSEPAK) 4  MG TBPK tablet Take as directed (Patient not taking: Reported on 10/04/2022) 21 tablet 0   oxybutynin (DITROPAN) 5 MG tablet Take 1 tablet (5 mg total) by mouth 3 (three) times daily. (Patient not taking: Reported on 10/04/2022) 60 tablet 3   No facility-administered medications prior to visit.    Allergies  Allergen Reactions   Nsaids Other (See Comments)    ON COUMADIN        Objective:    BP 118/84   Pulse 84   Ht 5\' 10"  (1.778 m)   Wt 292 lb 9.6 oz (132.7 kg)   LMP 07/05/2019 (Exact Date)   SpO2 97%   BMI 41.98 kg/m  Wt Readings from Last 3 Encounters:  10/04/22 292 lb 9.6 oz (132.7 kg)  05/01/22 298 lb 3.2 oz (135.3 kg)  11/28/21 294 lb 12.8 oz (133.7 kg)    Physical Exam Vitals and nursing note reviewed.  Constitutional:      Appearance: She is well-developed.  HENT:     Head: Normocephalic and atraumatic.  Cardiovascular:     Rate and Rhythm: Normal rate and regular rhythm.     Heart sounds: Normal heart sounds. No murmur heard.    No friction rub. No gallop.  Pulmonary:     Effort: Pulmonary effort is normal. No tachypnea or respiratory distress.     Breath sounds: Normal breath sounds. No decreased breath sounds, wheezing, rhonchi or rales.  Chest:     Chest wall: No tenderness.  Abdominal:     General: Bowel sounds are normal.     Palpations: Abdomen is soft.  Musculoskeletal:        General: Normal range of motion.     Cervical back: Normal range of motion.  Skin:    General: Skin is warm and dry.     Comments: SEE PHOTOS  Neurological:     Mental Status: She is alert and oriented to person, place, and time.     Coordination: Coordination normal.  Psychiatric:        Behavior: Behavior normal. Behavior is cooperative.        Thought Content: Thought content normal.        Judgment: Judgment normal.          Patient has been counseled extensively about nutrition and exercise as well as the importance of adherence with medications and regular follow-up. The patient was given clear instructions to go to ER or return to medical center if symptoms don't improve, worsen or new problems develop. The patient verbalized understanding.   Follow-up: Return for cancel april appt with newlin and push out for 3 months.   Gildardo Pounds, FNP-BC [facility] [place], [place]    10/04/2022, 12:10 PM

## 2022-10-10 ENCOUNTER — Encounter: Payer: Self-pay | Admitting: Pharmacist

## 2022-10-10 ENCOUNTER — Ambulatory Visit: Payer: Medicaid Other | Attending: Family Medicine | Admitting: Pharmacist

## 2022-10-10 DIAGNOSIS — Z7901 Long term (current) use of anticoagulants: Secondary | ICD-10-CM | POA: Diagnosis not present

## 2022-10-10 LAB — POCT INR: POC INR: 3

## 2022-10-23 ENCOUNTER — Other Ambulatory Visit: Payer: Self-pay | Admitting: Family Medicine

## 2022-10-24 ENCOUNTER — Ambulatory Visit: Payer: Medicaid Other | Admitting: Family Medicine

## 2022-10-24 ENCOUNTER — Other Ambulatory Visit: Payer: Self-pay

## 2022-10-24 MED ORDER — WARFARIN SODIUM 5 MG PO TABS
ORAL_TABLET | ORAL | 2 refills | Status: DC
  Filled 2022-10-24: qty 60, 30d supply, fill #0
  Filled 2022-12-03: qty 60, 30d supply, fill #1
  Filled 2023-01-08: qty 60, 30d supply, fill #2
  Filled 2023-02-05: qty 60, 30d supply, fill #3
  Filled 2023-03-20: qty 60, 30d supply, fill #4

## 2022-10-24 NOTE — Telephone Encounter (Signed)
Requested medications are due for refill today.  unsure  Requested medications are on the active medications list.  yes  Last refill. 05/18/2022 #90 2 rf  Future visit scheduled.   yes  Notes to clinic.   Refill not delegated.    Requested Prescriptions  Pending Prescriptions Disp Refills   warfarin (COUMADIN) 5 MG tablet 90 tablet 2    Sig: TAKE 1 TO 2 TABLETS BY MOUTH DAILY AS DIRECTED BY THE COUMADIN CLINIC.     Hematology:  Anticoagulants - warfarin Failed - 10/23/2022 11:21 PM      Failed - Manual Review: If patient's warfarin is managed by Anti-Coag team, route request to them. If not, route request to the provider.      Passed - INR in normal range and within 30 days    POC INR  Date Value Ref Range Status  10/10/2022 3.0  Final         Passed - HCT in normal range and within 360 days    HCT  Date Value Ref Range Status  07/11/2017 38.5 34.8 - 46.6 % Final   Hematocrit  Date Value Ref Range Status  11/28/2021 44.7 34.0 - 46.6 % Final         Passed - Patient is not pregnant      Passed - Valid encounter within last 3 months    Recent Outpatient Visits           2 weeks ago Asymptomatic varicose veins of left lower extremity   [place] [facility], Shea Stakes, NP   5 months ago History of pulmonary embolism   [place] [facility] Hoy Register, MD   11 months ago Hypercoagulable state [facility])   [place] [facility] Storm Frisk, MD   1 year ago Chronic anticoagulation   [facility] [place], Cornelius Moras, RPH-CPP   2 years ago Screening for colon cancer   [place] [facility] Hoy Register, MD       Future Appointments             In 2 months Hoy Register, MD [facility]

## 2022-10-27 ENCOUNTER — Other Ambulatory Visit: Payer: Self-pay

## 2022-10-31 ENCOUNTER — Ambulatory Visit: Payer: Medicaid Other | Admitting: Family Medicine

## 2022-11-17 ENCOUNTER — Other Ambulatory Visit: Payer: Self-pay

## 2022-11-23 ENCOUNTER — Ambulatory Visit: Payer: Medicaid Other | Attending: Family Medicine | Admitting: Pharmacist

## 2022-11-23 DIAGNOSIS — Z7901 Long term (current) use of anticoagulants: Secondary | ICD-10-CM

## 2022-11-23 LAB — POCT INR: POC INR: 2.3

## 2022-12-12 ENCOUNTER — Encounter (HOSPITAL_BASED_OUTPATIENT_CLINIC_OR_DEPARTMENT_OTHER): Payer: Self-pay | Admitting: Emergency Medicine

## 2022-12-12 ENCOUNTER — Other Ambulatory Visit: Payer: Self-pay

## 2022-12-12 ENCOUNTER — Emergency Department (HOSPITAL_BASED_OUTPATIENT_CLINIC_OR_DEPARTMENT_OTHER): Payer: Medicaid Other

## 2022-12-12 ENCOUNTER — Emergency Department (HOSPITAL_BASED_OUTPATIENT_CLINIC_OR_DEPARTMENT_OTHER)
Admission: EM | Admit: 2022-12-12 | Discharge: 2022-12-12 | Disposition: A | Discharge status: Home / Self Care | Payer: Medicaid Other | Attending: Emergency Medicine | Admitting: Emergency Medicine

## 2022-12-12 DIAGNOSIS — M25561 Pain in right knee: Secondary | ICD-10-CM | POA: Diagnosis not present

## 2022-12-12 DIAGNOSIS — Z7901 Long term (current) use of anticoagulants: Secondary | ICD-10-CM | POA: Insufficient documentation

## 2022-12-12 DIAGNOSIS — M79661 Pain in right lower leg: Secondary | ICD-10-CM | POA: Diagnosis not present

## 2022-12-12 DIAGNOSIS — M79604 Pain in right leg: Secondary | ICD-10-CM | POA: Diagnosis present

## 2022-12-12 LAB — PROTIME-INR
INR: 2.8 — ABNORMAL HIGH (ref 0.8–1.2)
Prothrombin Time: 29.4 seconds — ABNORMAL HIGH (ref 11.4–15.2)

## 2022-12-12 NOTE — Discharge Instructions (Addendum)
See your Physician for recheck in 1 week.  Try knee sleeve for comfort

## 2022-12-12 NOTE — ED Notes (Signed)
Discharge paperwork reviewed entirely with patient, including follow up care. Pain was under control. No prescriptions were called in, but all questions were addressed.  Pt verbalized understanding as well as all parties involved. No questions or concerns voiced at the time of discharge. No acute distress noted.   Pt ambulated out to PVA without incident or assistance.  

## 2022-12-12 NOTE — ED Triage Notes (Signed)
Right upper leg pain intermittent for a few months.  No known injury.

## 2022-12-12 NOTE — ED Provider Notes (Signed)
[place] [facility] Provider Note   CSN: [id number] Arrival date & time: 12/12/22  1204     History  Chief Complaint  Patient presents with   Leg Pain    Donna [name] is a 52 y.o. female.  Ms. Donna [name] is a 52 year old  female who presents to the ED with chief complaint of right leg pain x 1 month. The pain is mostly relegated to her quadriceps area with radiation down to the lower leg. She describes "sharp" and "shooting" pains that occur when she walks or bends her knee. At rest she does not experience pain, but pain is rated 10/10 with movement. 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 Denies previous injury or surgery to the right leg. To note, she has a history of both DVT and PE  The history is provided by the patient. No language interpreter was used.  Leg Pain      Home Medications Prior to Admission medications   Medication Sig Start Date End Date Taking? Authorizing Provider  acetaminophen (TYLENOL) 500 MG tablet Take 1,000 mg by mouth every 6 (six) hours as needed for mild pain.    [provider]  atorvastatin (LIPITOR) 20 MG tablet Take 1 tablet (20 mg total) by mouth daily. Patient not taking: Reported on 10/04/2022 11/28/21   Storm Frisk, MD  gabapentin (NEURONTIN) 300 MG capsule Take 1 capsule (300 mg total) by mouth at bedtime. 09/25/22   Vivi Barrack, DPM  warfarin (COUMADIN) 5 MG tablet TAKE 1 TO 2 TABLETS BY MOUTH DAILY AS DIRECTED BY THE COUMADIN CLINIC. 10/24/22   Hoy Register, MD      Allergies    Nsaids    Review of Systems   Review of Systems  Musculoskeletal:  Positive for joint swelling and myalgias.  All other systems reviewed and are negative.   Physical Exam Updated Vital Signs BP 100/66 (BP Location: Left Arm)   Pulse 99   Temp 97.7 F (36.5 C)   Resp 18   Ht 5\' 9"  (1.753 m)   Wt (!) 136.7 kg    LMP 07/05/2019 (Exact Date)   SpO2 98%   BMI 44.49 kg/m  Physical Exam Vitals and nursing note reviewed.  Constitutional:      Appearance: She is well-developed.  HENT:     Head: Normocephalic.  Pulmonary:     Effort: Pulmonary effort is normal.  Abdominal:     General: There is no distension.  Musculoskeletal:        General: Swelling and tenderness present.     Cervical back: Normal range of motion.     Comments: Swelling right knee, decreased range of motion  from nv and ns intact   Neurological:     Mental Status: She is alert and oriented to person, place, and time.  Psychiatric:        Mood and Affect: Mood normal.     ED Results / Procedures / Treatments   Labs (all labs ordered are listed, but only abnormal results are displayed) Labs Reviewed  PROTIME-INR - Abnormal; Notable for the following components:      Result Value   Prothrombin Time 29.4 (*)    INR 2.8 (*)    All other components within normal limits    EKG None  Radiology US Venous Img Lower Unilateral Right  Result Date: 12/12/2022 CLINICAL DATA:  Right  lower extremity pain for the past several months. History of previous DVT and pulmonary embolism. Evaluate for acute or chronic DVT. EXAM: RIGHT LOWER EXTREMITY VENOUS DOPPLER ULTRASOUND TECHNIQUE: Gray-scale sonography with graded compression, as well as color Doppler and duplex ultrasound were performed to evaluate the lower extremity deep venous systems from the level of the common femoral vein and including the common femoral, femoral, profunda femoral, popliteal and calf veins including the posterior tibial, peroneal and gastrocnemius veins when visible. The superficial great saphenous vein was also interrogated. Spectral Doppler was utilized to evaluate flow at rest and with distal augmentation maneuvers in the common femoral, femoral and popliteal veins. COMPARISON:  None Available. FINDINGS: Contralateral Common Femoral Vein: Respiratory phasicity  is normal and symmetric with the symptomatic side. No evidence of thrombus. Normal compressibility. Common Femoral Vein: No evidence of thrombus. Normal compressibility, respiratory phasicity and response to augmentation. Saphenofemoral Junction: No evidence of thrombus. Normal compressibility and flow on color Doppler imaging. Profunda Femoral Vein: No evidence of thrombus. Normal compressibility and flow on color Doppler imaging. Femoral Vein: No evidence of thrombus. Normal compressibility, respiratory phasicity and response to augmentation. Popliteal Vein: No evidence of thrombus. Normal compressibility, respiratory phasicity and response to augmentation. Calf Veins: No evidence of thrombus. Normal compressibility and flow on color Doppler imaging. Superficial Great Saphenous Vein: No evidence of thrombus. Normal compressibility. Other Findings:  None. IMPRESSION: No evidence of acute or chronic DVT within the right lower extremity. Electronically Signed   By: Simonne Come M.D.   On: 12/12/2022 13:25   DG Knee Complete 4 Views Right  Result Date: 12/12/2022 CLINICAL DATA:  Intermittent right knee pain for several months. Constantly the last few days. EXAM: RIGHT KNEE - COMPLETE 4+ VIEW COMPARISON:  Right tibia and fibula radiographs 07/23/2013 FINDINGS: Minimal compartment joint space narrowing and peripheral osteophytosis. Moderate patellofemoral joint space narrowing. Mild superior patellar degenerative osteophytosis. There are again subchondral lucent likely cystic changes within the mid to superior aspect of the patella. No knee joint effusion. No acute fracture or dislocation. 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 Electronically Signed   By: Neita Garnet M.D.   On: 12/12/2022 13:10    Procedures Procedures    Medications Ordered in ED Medications - No data to display  ED Course/ Medical Decision Making/ A&P                              Medical Decision Making Pt complains of right leg pain   Amount and/or Complexity of Data Reviewed Radiology: ordered and independent interpretation performed. Decision-making details documented in ED Course.    Details: Xray right knee shows osteoarthritis   Ultrasound no dvt.   Risk OTC drugs. Risk Details: Pt placed in a splint.  Pt advised tylenol for discomfort            Final Clinical Impression(s) / ED Diagnoses Final diagnoses:  Acute pain of right knee    Rx / DC Orders ED Discharge Orders     None     An After Visit Summary was printed and given to the patient.     Elson Areas, [place] 12/12/22 1737    Arby Barrette, MD 12/19/22 [phone]

## 2023-01-04 ENCOUNTER — Ambulatory Visit: Payer: Medicaid Other | Admitting: Pharmacist

## 2023-01-04 ENCOUNTER — Ambulatory Visit: Payer: Medicaid Other | Admitting: Family Medicine

## 2023-01-10 ENCOUNTER — Other Ambulatory Visit: Payer: Self-pay

## 2023-02-06 ENCOUNTER — Other Ambulatory Visit: Payer: Self-pay

## 2023-03-21 ENCOUNTER — Other Ambulatory Visit: Payer: Self-pay | Admitting: Family Medicine

## 2023-03-21 ENCOUNTER — Other Ambulatory Visit: Payer: Self-pay

## 2023-03-21 MED ORDER — WARFARIN SODIUM 5 MG PO TABS
5.0000 mg | ORAL_TABLET | Freq: Every day | ORAL | 0 refills | Status: DC
  Filled 2023-03-21: qty 60, 30d supply, fill #0

## 2023-03-22 ENCOUNTER — Other Ambulatory Visit: Payer: Self-pay

## 2023-04-04 ENCOUNTER — Ambulatory Visit: Payer: Medicaid Other | Attending: Family Medicine | Admitting: Pharmacist

## 2023-04-04 DIAGNOSIS — Z7901 Long term (current) use of anticoagulants: Secondary | ICD-10-CM | POA: Diagnosis not present

## 2023-04-04 LAB — POCT INR: POC INR: 2.1

## 2023-04-23 ENCOUNTER — Other Ambulatory Visit: Payer: Self-pay | Admitting: Family Medicine

## 2023-04-24 ENCOUNTER — Other Ambulatory Visit: Payer: Self-pay

## 2023-04-24 MED ORDER — WARFARIN SODIUM 5 MG PO TABS
5.0000 mg | ORAL_TABLET | Freq: Every day | ORAL | 1 refills | Status: DC
  Filled 2023-04-24: qty 60, 30d supply, fill #0
  Filled 2023-05-28: qty 60, 30d supply, fill #1
  Filled 2023-07-09: qty 60, 30d supply, fill #2

## 2023-04-26 ENCOUNTER — Other Ambulatory Visit: Payer: Self-pay

## 2023-05-17 ENCOUNTER — Ambulatory Visit: Payer: Medicaid Other | Admitting: Pharmacist

## 2023-05-18 ENCOUNTER — Telehealth (INDEPENDENT_AMBULATORY_CARE_PROVIDER_SITE_OTHER): Payer: Self-pay

## 2023-05-18 NOTE — Telephone Encounter (Signed)
Copied from CRM [id number]. Topic: Appointment Scheduling - Scheduling Inquiry for Clinic >> May 17, 2023  1:36 PM Franchot Heidelberg wrote: Reason for CRM: Pt called and wants to reschedule her coumadin clinic appt

## 2023-05-28 ENCOUNTER — Ambulatory Visit: Payer: Medicaid Other | Attending: Family Medicine | Admitting: Pharmacist

## 2023-05-28 DIAGNOSIS — Z7901 Long term (current) use of anticoagulants: Secondary | ICD-10-CM | POA: Diagnosis not present

## 2023-05-28 LAB — POCT INR: POC INR: 2

## 2023-05-31 ENCOUNTER — Other Ambulatory Visit: Payer: Self-pay

## 2023-07-09 ENCOUNTER — Other Ambulatory Visit: Payer: Self-pay

## 2023-07-09 ENCOUNTER — Ambulatory Visit: Payer: Medicaid Other | Attending: Family Medicine | Admitting: Pharmacist

## 2023-07-09 DIAGNOSIS — Z7901 Long term (current) use of anticoagulants: Secondary | ICD-10-CM | POA: Diagnosis not present

## 2023-07-09 LAB — POCT INR: POC INR: 1.6

## 2023-07-10 ENCOUNTER — Other Ambulatory Visit: Payer: Self-pay

## 2023-08-09 ENCOUNTER — Ambulatory Visit: Payer: Medicaid Other | Attending: Family Medicine | Admitting: Pharmacist

## 2023-08-09 ENCOUNTER — Other Ambulatory Visit: Payer: Self-pay

## 2023-08-09 DIAGNOSIS — Z7901 Long term (current) use of anticoagulants: Secondary | ICD-10-CM

## 2023-08-09 LAB — POCT INR
INR: 1.9 — AB (ref 2.0–3.0)
POC INR: 1.9

## 2023-08-09 MED ORDER — WARFARIN SODIUM 5 MG PO TABS
5.0000 mg | ORAL_TABLET | Freq: Every day | ORAL | 1 refills | Status: DC
  Filled 2023-08-09: qty 60, 30d supply, fill #0
  Filled 2023-08-27: qty 60, 30d supply, fill #1
  Filled 2023-09-30: qty 60, 30d supply, fill #2
  Filled 2023-11-02: qty 60, 30d supply, fill #3
  Filled 2023-12-12: qty 60, 30d supply, fill #4
  Filled 2024-01-21: qty 60, 30d supply, fill #5

## 2023-08-14 ENCOUNTER — Other Ambulatory Visit: Payer: Self-pay

## 2023-08-27 ENCOUNTER — Other Ambulatory Visit: Payer: Self-pay

## 2023-08-27 ENCOUNTER — Encounter: Payer: Self-pay | Admitting: Hematology

## 2023-09-10 ENCOUNTER — Ambulatory Visit: Payer: Medicaid Other | Admitting: Pharmacist

## 2023-10-01 ENCOUNTER — Other Ambulatory Visit: Payer: Self-pay

## 2023-10-11 ENCOUNTER — Ambulatory Visit: Payer: Medicaid Other | Attending: Family Medicine | Admitting: Pharmacist

## 2023-10-11 DIAGNOSIS — Z7901 Long term (current) use of anticoagulants: Secondary | ICD-10-CM | POA: Diagnosis not present

## 2023-10-11 LAB — POCT INR: POC INR: 3.1

## 2023-11-06 ENCOUNTER — Other Ambulatory Visit: Payer: Self-pay

## 2023-11-12 ENCOUNTER — Ambulatory Visit: Admitting: Pharmacist

## 2023-11-13 ENCOUNTER — Ambulatory Visit: Attending: Family Medicine | Admitting: Pharmacist

## 2023-11-13 DIAGNOSIS — Z7901 Long term (current) use of anticoagulants: Secondary | ICD-10-CM

## 2023-11-13 LAB — POCT INR: POC INR: 2.1

## 2023-12-13 ENCOUNTER — Other Ambulatory Visit: Payer: Self-pay

## 2023-12-27 ENCOUNTER — Ambulatory Visit: Payer: Self-pay | Attending: Family Medicine | Admitting: Pharmacist

## 2023-12-27 DIAGNOSIS — Z86711 Personal history of pulmonary embolism: Secondary | ICD-10-CM

## 2023-12-27 DIAGNOSIS — Z7901 Long term (current) use of anticoagulants: Secondary | ICD-10-CM | POA: Diagnosis not present

## 2023-12-27 LAB — POCT INR: POC INR: 2.1

## 2024-01-23 ENCOUNTER — Other Ambulatory Visit: Payer: Self-pay

## 2024-02-05 ENCOUNTER — Telehealth: Payer: Self-pay | Admitting: Family Medicine

## 2024-02-05 NOTE — Telephone Encounter (Signed)
 Called pt to confirm appt for 7/24

## 2024-02-07 ENCOUNTER — Ambulatory Visit: Attending: Family Medicine | Admitting: Pharmacist

## 2024-02-07 DIAGNOSIS — Z7901 Long term (current) use of anticoagulants: Secondary | ICD-10-CM | POA: Diagnosis not present

## 2024-02-07 LAB — POCT INR: POC INR: 1.6

## 2024-03-02 ENCOUNTER — Other Ambulatory Visit: Payer: Self-pay | Admitting: Family Medicine

## 2024-03-03 ENCOUNTER — Emergency Department (HOSPITAL_BASED_OUTPATIENT_CLINIC_OR_DEPARTMENT_OTHER)
Admission: EM | Admit: 2024-03-03 | Discharge: 2024-03-03 | Disposition: A | Discharge status: Home / Self Care | Attending: Emergency Medicine | Admitting: Emergency Medicine

## 2024-03-03 ENCOUNTER — Emergency Department (HOSPITAL_BASED_OUTPATIENT_CLINIC_OR_DEPARTMENT_OTHER)

## 2024-03-03 ENCOUNTER — Encounter (HOSPITAL_BASED_OUTPATIENT_CLINIC_OR_DEPARTMENT_OTHER): Payer: Self-pay | Admitting: Emergency Medicine

## 2024-03-03 ENCOUNTER — Other Ambulatory Visit: Payer: Self-pay

## 2024-03-03 DIAGNOSIS — Z7901 Long term (current) use of anticoagulants: Secondary | ICD-10-CM | POA: Diagnosis not present

## 2024-03-03 DIAGNOSIS — M25562 Pain in left knee: Secondary | ICD-10-CM | POA: Diagnosis not present

## 2024-03-03 DIAGNOSIS — M1712 Unilateral primary osteoarthritis, left knee: Secondary | ICD-10-CM | POA: Diagnosis not present

## 2024-03-03 DIAGNOSIS — M7122 Synovial cyst of popliteal space [Baker], left knee: Secondary | ICD-10-CM | POA: Insufficient documentation

## 2024-03-03 DIAGNOSIS — Z5181 Encounter for therapeutic drug level monitoring: Secondary | ICD-10-CM

## 2024-03-03 DIAGNOSIS — M79605 Pain in left leg: Secondary | ICD-10-CM | POA: Diagnosis not present

## 2024-03-03 DIAGNOSIS — R7989 Other specified abnormal findings of blood chemistry: Secondary | ICD-10-CM | POA: Diagnosis not present

## 2024-03-03 LAB — CBC WITH DIFFERENTIAL/PLATELET
Abs Immature Granulocytes: 0.03 K/uL (ref 0.00–0.07)
Basophils Absolute: 0.1 K/uL (ref 0.0–0.1)
Basophils Relative: 1 %
Eosinophils Absolute: 0.2 K/uL (ref 0.0–0.5)
Eosinophils Relative: 4 %
HCT: 40.4 % (ref 36.0–46.0)
Hemoglobin: 13.1 g/dL (ref 12.0–15.0)
Immature Granulocytes: 1 %
Lymphocytes Relative: 39 %
Lymphs Abs: 2 K/uL (ref 0.7–4.0)
MCH: 27.9 pg (ref 26.0–34.0)
MCHC: 32.4 g/dL (ref 30.0–36.0)
MCV: 86.1 fL (ref 80.0–100.0)
Monocytes Absolute: 0.5 K/uL (ref 0.1–1.0)
Monocytes Relative: 9 %
Neutro Abs: 2.4 K/uL (ref 1.7–7.7)
Neutrophils Relative %: 46 %
Platelets: 251 K/uL (ref 150–400)
RBC: 4.69 MIL/uL (ref 3.87–5.11)
RDW: 15.7 % — ABNORMAL HIGH (ref 11.5–15.5)
WBC: 5.1 K/uL (ref 4.0–10.5)
nRBC: 0 % (ref 0.0–0.2)

## 2024-03-03 LAB — BASIC METABOLIC PANEL WITH GFR
Anion gap: 12 (ref 5–15)
BUN: 11 mg/dL (ref 6–20)
CO2: 21 mmol/L — ABNORMAL LOW (ref 22–32)
Calcium: 8.5 mg/dL — ABNORMAL LOW (ref 8.9–10.3)
Chloride: 104 mmol/L (ref 98–111)
Creatinine, Ser: 0.78 mg/dL (ref 0.44–1.00)
GFR, Estimated: >60 mL/min (ref >60)
Glucose, Bld: 123 mg/dL — ABNORMAL HIGH (ref 70–99)
Potassium: 3.9 mmol/L (ref 3.5–5.1)
Sodium: 137 mmol/L (ref 135–145)

## 2024-03-03 LAB — PROTIME-INR
INR: 1.2 (ref 0.8–1.2)
Prothrombin Time: 15.7 s — ABNORMAL HIGH (ref 11.4–15.2)

## 2024-03-03 MED ORDER — ENOXAPARIN SODIUM 40 MG/0.4ML IJ SOSY
120.0000 mg | PREFILLED_SYRINGE | Freq: Two times a day (BID) | INTRAMUSCULAR | 0 refills | Status: AC
  Filled 2024-03-03: qty 12, 5d supply, fill #0

## 2024-03-03 MED ORDER — ENOXAPARIN SODIUM 120 MG/0.8ML IJ SOSY
120.0000 mg | PREFILLED_SYRINGE | Freq: Once | INTRAMUSCULAR | Status: AC
  Administered 2024-03-03: 120 mg via SUBCUTANEOUS
  Filled 2024-03-03: qty 0.8

## 2024-03-03 MED ORDER — WARFARIN SODIUM 5 MG PO TABS
5.0000 mg | ORAL_TABLET | Freq: Every day | ORAL | 1 refills | Status: DC
  Filled 2024-03-03: qty 180, 90d supply, fill #0

## 2024-03-03 NOTE — ED Notes (Signed)
 Pt d/c home with visitor per EDP order. Discharge summary reviewed, pt verbalizes understanding. NAD.

## 2024-03-03 NOTE — ED Provider Notes (Signed)
 [place] [facility] Provider Note   CSN: [id number] Arrival date & time: 03/03/24  1737     Patient presents with: Leg Pain   Donna [name] is a 53 y.o. female.   Patient here for left posterior knee pain.  History of clots on Coumadin .  Some concern for clot.  She also has issues with her knees in the past with most of the right knee.  Denies any shortness of breath chest pain weakness numbness tingling.  The pain since yesterday.  The history is provided by the patient.       Prior to Admission medications   Medication Sig Start Date End Date Taking? Authorizing Provider  enoxaparin  (LOVENOX ) 40 MG/0.4ML injection Inject 1.2 mLs (120 mg total) into the skin every 12 (twelve) hours for 5 days. 03/03/24 03/08/24 Yes Lenae Wherley, DO  acetaminophen  (TYLENOL ) 500 MG tablet Take 1,000 mg by mouth every 6 (six) hours as needed for mild pain.    [provider]  atorvastatin  (LIPITOR) 20 MG tablet Take 1 tablet (20 mg total) by mouth daily. Patient not taking: Reported on 10/04/2022 11/28/21   Brien Belvie BRAVO, MD  gabapentin  (NEURONTIN ) 300 MG capsule Take 1 capsule (300 mg total) by mouth at bedtime. 09/25/22   Gershon Donnice SAUNDERS, DPM  warfarin (COUMADIN ) 5 MG tablet Take 1-2 tablets (5-10 mg total) by mouth daily. 08/09/23   Newlin, Enobong, MD    Allergies: Nsaids    Review of Systems  Updated Vital Signs BP 129/84   Pulse 82   Temp 98.3 F (36.8 C) (Oral)   Resp 17   Ht 5' 9 (1.753 m)   Wt 117.9 kg   LMP 07/05/2019 (Exact Date)   SpO2 99%   BMI 38.40 kg/m   Physical Exam Vitals and nursing note reviewed.  Constitutional:      General: She is not in acute distress.    Appearance: She is well-developed.  HENT:     Head: Normocephalic and atraumatic.  Eyes:     Extraocular Movements: Extraocular movements intact.     Conjunctiva/sclera: Conjunctivae normal.     Pupils: Pupils are equal, round, and reactive to  light.  Cardiovascular:     Rate and Rhythm: Normal rate and regular rhythm.     Pulses: Normal pulses.     Heart sounds: Normal heart sounds. No murmur heard. Pulmonary:     Effort: Pulmonary effort is normal. No respiratory distress.     Breath sounds: Normal breath sounds.  Abdominal:     Palpations: Abdomen is soft.     Tenderness: There is no abdominal tenderness.  Musculoskeletal:        General: Tenderness (TTP posterior left knee) present. No swelling.     Cervical back: Normal range of motion and neck supple.     Right lower leg: No edema.     Left lower leg: No edema.  Skin:    General: Skin is warm and dry.     Capillary Refill: Capillary refill takes less than 2 seconds.  Neurological:     Mental Status: She is alert.  Psychiatric:        Mood and Affect: Mood normal.     (all labs ordered are listed, but only abnormal results are displayed) Labs Reviewed  CBC WITH DIFFERENTIAL/PLATELET - Abnormal; Notable for the following components:      Result Value   RDW 15.7 (*)    All other components within  normal limits  BASIC METABOLIC PANEL WITH GFR - Abnormal; Notable for the following components:   CO2 21 (*)    Glucose, Bld 123 (*)    Calcium  8.5 (*)    All other components within normal limits  PROTIME-INR - Abnormal; Notable for the following components:   Prothrombin Time 15.7 (*)    All other components within normal limits    EKG: None  Radiology: US  Venous Img Lower  Left (DVT Study) Result Date: 03/03/2024 CLINICAL DATA:  Left lower extremity pain EXAM: LEFT LOWER EXTREMITY VENOUS DOPPLER ULTRASOUND TECHNIQUE: Gray-scale sonography with compression, as well as color and duplex ultrasound, were performed to evaluate the deep venous system(s) from the level of the common femoral vein through the popliteal and proximal calf veins. COMPARISON:  None Available. FINDINGS: VENOUS Normal compressibility of the common femoral, superficial femoral, and popliteal  veins, as well as the visualized calf veins. Visualized portions of profunda femoral vein and great saphenous vein unremarkable. No filling defects to suggest DVT on grayscale or color Doppler imaging. Doppler waveforms show normal direction of venous flow, normal respiratory plasticity and response to augmentation. Limited views of the contralateral common femoral vein are unremarkable. OTHER 3.6 x 3.1 x 1.5 cm Baker's cyst incidentally noted. Limitations: none IMPRESSION: 1. No evidence of deep venous thrombosis within the left lower extremity. 2. Left Baker's cyst. Electronically Signed   By: Ozell Daring M.D.   On: 03/03/2024 19:32   DG Knee Complete 4 Views Left Result Date: 03/03/2024 CLINICAL DATA:  Pain EXAM: LEFT KNEE - COMPLETE 4+ VIEW COMPARISON:  None Available. FINDINGS: There is prepatellar soft tissue swelling. No definite joint effusion. No acute fracture or dislocation identified. There are mild degenerative changes of the medial compartment with joint space narrowing and osteophyte formation. IMPRESSION: Prepatellar soft tissue swelling. No acute fracture or dislocation. Electronically Signed   By: Greig Pique M.D.   On: 03/03/2024 18:33     Procedures   Medications Ordered in the ED  enoxaparin  (LOVENOX ) injection 120 mg (has no administration in time range)                                    Medical Decision Making Amount and/or Complexity of Data Reviewed Labs: ordered. Radiology: ordered.  Risk Prescription drug management.   Donna [name] is here with pain behind her left knee.  History of DVT.  She is on Coumadin .  CT scan does show prepatellar soft tissue swelling but no acute fracture or dislocation.  DVT study was negative for DVT but she does have a Baker's cyst.  We got ultrasound and x-ray has a differential diagnosis was likely arthritis Baker's cyst versus DVT.  I have no concern for infectious process.  However INR came back at subtherapeutic at 1.2.   I talked with Dorn Buttner with pharmacy who recommended that she take 10 mg of Coumadin  today and tomorrow and continue her normal regimen.  We will bridge her back on Lovenox  shots 1 mg/kg twice a day.  She will follow-up in Coumadin  clinic in 2 to 3 days for recheck and further adjustment.  I will refer her to orthopedics regarding Baker's cyst.  Recommend Tylenol  ice and rest.  Discharged in good condition.  Understands return precautions.  This chart was dictated using voice recognition software.  Despite best efforts to proofread,  errors can occur which can change the  documentation meaning.      Final diagnoses:  Synovial cyst of left popliteal space  Subtherapeutic anticoagulation    ED Discharge Orders          Ordered    enoxaparin  (LOVENOX ) 40 MG/0.4ML injection  Every 12 hours        03/03/24 1938               Ruthe Cornet, DO 03/03/24 1943

## 2024-03-03 NOTE — ED Notes (Signed)
 [place] to bedside

## 2024-03-03 NOTE — Discharge Instructions (Addendum)
 Take 10 mg of Coumadin  total today and tomorrow and then go back to your normal dosing pattern.  As we discussed you should bridge your self back with Lovenox  shot.  You have been given your first shot today.  Pick up your prescription tomorrow for this and continue this twice a day and have your Coumadin  level rechecked on Thursday.  Overall follow-up with orthopedics regarding your Baker's cyst.  Recommend 1000 mg of Tylenol  every 6 hours as needed for pain.  Recommend ice and rest.

## 2024-03-03 NOTE — ED Notes (Signed)
XRAY to bedside

## 2024-03-03 NOTE — ED Triage Notes (Signed)
 Pt reports pain to calf/knee to left lower leg since yesterday, hx of blood clots, takes Warfarin

## 2024-03-04 ENCOUNTER — Other Ambulatory Visit: Payer: Self-pay

## 2024-03-04 ENCOUNTER — Other Ambulatory Visit (HOSPITAL_COMMUNITY): Payer: Self-pay

## 2024-03-07 ENCOUNTER — Ambulatory Visit: Payer: Self-pay

## 2024-03-07 NOTE — Telephone Encounter (Signed)
 FYI Only or Action Required?: Action required by provider: request for appointment.  Patient was last seen in primary care on 10/04/2022 by Theotis Haze ORN, NP.  Called Nurse Triage reporting Leg Swelling.  Symptoms began a week ago.  Interventions attempted: Other: Went to the ED.  Symptoms are: gradually improving.  Triage Disposition: Home Care  Patient/caregiver understands and will follow disposition?: No, wishes to speak with PCP          Copied from CRM #[id number]. Topic: Clinical - Red Word Triage >> Mar 07, 2024  4:29 PM Jayma L wrote: Red Word that prompted transfer to Nurse Triage: went to hospital on Monday , fluid in legs and having issues walking, blood clot in leg, gave her shots - shots are making her legs get bigger - bruising bad and getting bigger , some swelling .            Reason for Disposition  [1] Localized swelling (e.g., small area of puffy or swollen skin) AND [2] itchy  Answer Assessment - Initial Assessment Questions Patient calling to set up a hospital follow up appointment. No appointments available until the end of September. Please advise.        1. ONSET: When did the swelling start? (e.g., minutes, hours, days)     About a week ago  2. LOCATION: What part of the leg is swollen?  Are both legs swollen or just one leg?     Left leg  3. SEVERITY: How bad is the swelling? (e.g., localized; mild, moderate, severe)     Localized  4. REDNESS: Is there redness or signs of infection?     No 5. PAIN: Is the swelling painful to touch? If Yes, ask: How painful is it?   (Scale 1-10; mild, moderate or severe)     Mild when walking  6. FEVER: Do you have a fever? If Yes, ask: What is it, how was it measured, and when did it start?      No 7. CAUSE: What do you think is causing the leg swelling?     Recently treated for a synovial cyst  8. MEDICAL HISTORY: Do you have a history of blood clots (e.g., DVT), cancer,  heart failure, kidney disease, or liver failure?     DVT 9. RECURRENT SYMPTOM: Have you had leg swelling before? If Yes, ask: When was the last time? What happened that time?     No 10. OTHER SYMPTOMS: Do you have any other symptoms? (e.g., chest pain, difficulty breathing)       No  Protocols used: Leg Swelling and Edema-A-AH

## 2024-03-10 NOTE — Telephone Encounter (Signed)
Pt needs HFU

## 2024-03-18 ENCOUNTER — Telehealth: Payer: Self-pay | Admitting: Family Medicine

## 2024-03-18 NOTE — Telephone Encounter (Signed)
 Pt unconfirmed appt 9/2

## 2024-03-19 ENCOUNTER — Encounter: Payer: Self-pay | Admitting: Hematology

## 2024-03-19 ENCOUNTER — Encounter: Payer: Self-pay | Admitting: Family Medicine

## 2024-03-19 ENCOUNTER — Other Ambulatory Visit (HOSPITAL_COMMUNITY): Payer: Self-pay

## 2024-03-19 ENCOUNTER — Ambulatory Visit: Attending: Family Medicine | Admitting: Family Medicine

## 2024-03-19 ENCOUNTER — Other Ambulatory Visit: Payer: Self-pay

## 2024-03-19 VITALS — BP 130/85 | HR 96 | Ht 69.0 in | Wt 316.0 lb

## 2024-03-19 DIAGNOSIS — M7122 Synovial cyst of popliteal space [Baker], left knee: Secondary | ICD-10-CM | POA: Diagnosis not present

## 2024-03-19 DIAGNOSIS — Z1231 Encounter for screening mammogram for malignant neoplasm of breast: Secondary | ICD-10-CM

## 2024-03-19 DIAGNOSIS — E7800 Pure hypercholesterolemia, unspecified: Secondary | ICD-10-CM

## 2024-03-19 DIAGNOSIS — M170 Bilateral primary osteoarthritis of knee: Secondary | ICD-10-CM

## 2024-03-19 DIAGNOSIS — Z86711 Personal history of pulmonary embolism: Secondary | ICD-10-CM

## 2024-03-19 DIAGNOSIS — Z131 Encounter for screening for diabetes mellitus: Secondary | ICD-10-CM | POA: Diagnosis not present

## 2024-03-19 LAB — POCT INR: INR: 1.8 — AB (ref 2.0–3.0)

## 2024-03-19 MED ORDER — DICLOFENAC SODIUM 1 % EX GEL
4.0000 g | Freq: Four times a day (QID) | CUTANEOUS | 1 refills | Status: AC
  Filled 2024-03-19: qty 100, 10d supply, fill #0

## 2024-03-19 MED ORDER — ATORVASTATIN CALCIUM 20 MG PO TABS
20.0000 mg | ORAL_TABLET | Freq: Every day | ORAL | 6 refills | Status: AC
Start: 2024-03-19 — End: ?
  Filled 2024-03-19: qty 30, 30d supply, fill #0

## 2024-03-19 NOTE — Progress Notes (Signed)
 Subjective:  Patient ID: Othel LITTIE Dry, female    DOB: 22-Jan-1971  Age: 53 y.o. MRN: [id number]  CC: Hospitalization Follow-up     Discussed the use of AI scribe software for clinical note transcription with the patient, who gave verbal consent to proceed.  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  She experiences intermittent pain behind her left knee, identified as a Baker's cyst at an ED visit 2 weeks ago, with varying intensity that worsens with walking.  Lower extremity Doppler was negative for DVT.  She uses Tylenol  for pain management.   She is on anticoagulant therapy with warfarin, taking 5 mg on Mondays and Fridays, and 10 mg on other days.  At her ED visit she had a subtherapeutic INR of 1.2 and Lovenox  injections were added.  Lovenox  injections were discontinued after two days due to significant bruising.  She has elevated cholesterol and was prescribed atorvastatin , which she has not been taking. Her last cholesterol check was in 2023.  She reports knee pain and popping sounds. Her weight has increased from 298 lbs to 316 lbs, which she attributes to decreased physical activity after stopping work with [employer].    Past Medical History:  Diagnosis Date   DVT (deep venous thrombosis) (HCC) 12/06/2012   Lung collapse    PONV (postoperative nausea and vomiting)    Pulmonary embolism affecting pregnancy     Past Surgical History:  Procedure Laterality Date   HYSTERECTOMY ABDOMINAL WITH SALPINGECTOMY Bilateral 07/15/2019   Procedure:  HYSTERECTOMY ABDOMINAL WITH SALPINGECTOMY;  Surgeon: Eveline Lynwood MATSU, MD;  Location: [facility] OR;  Service: Gynecology;  Laterality: Bilateral;   RIB RESECTION      Family History  Problem Relation Age of Onset   COPD Father    Stroke Father     Social History   Socioeconomic History   Marital status: Single    Spouse name: Not on file   Number of children: Not on file   Years of education: Not on file   Highest education level: 12th grade  Occupational History   Not on file  Tobacco Use   Smoking status: Never   Smokeless tobacco: Never  Vaping Use   Vaping status: Never Used  Substance and Sexual Activity   Alcohol use: Yes    Comment: socially    Drug use: No   Sexual activity: Not on file  Other Topics Concern   Not on file  Social History Narrative   Not on file   Social Drivers of Health   Financial Resource Strain: Low Risk  (07/09/2023)   Overall Financial Resource Strain (CARDIA)    Difficulty of Paying Living Expenses: Not hard at all  Food Insecurity: No Food Insecurity (07/09/2023)   Hunger Vital Sign    Worried About Running Out of Food in the Last Year: Never true    Ran Out of Food in the Last Year: Never true  Transportation Needs: No Transportation Needs (07/09/2023)   PRAPARE - Administrator, Civil Service (Medical): No    Lack of Transportation (Non-Medical): No  Physical Activity: Sufficiently Active (07/09/2023)   Exercise  Vital Sign    Days of Exercise per Week: 7 days    Minutes of Exercise per Session: 150+ min  Stress: No Stress Concern Present (07/09/2023)   [employer] of Occupational Health - Occupational Stress Questionnaire    Feeling of Stress : Only a little  Social Connections: Moderately Integrated (07/09/2023)   Social Connection and Isolation Panel    Frequency of Communication with Friends and Family: More than three times a week    Frequency of Social Gatherings with Friends and Family: More than three times a  week    Attends Religious Services: More than 4 times per year    Active Member of [employer] or Organizations: No    Attends Engineer, structural: Not on file    Marital Status: Married    Allergies  Allergen Reactions   Nsaids Other (See Comments)    ON COUMADIN     Outpatient Medications Prior to Visit  Medication Sig Dispense Refill   acetaminophen  (TYLENOL ) 500 MG tablet Take 1,000 mg by mouth every 6 (six) hours as needed for mild pain.     warfarin (COUMADIN ) 5 MG tablet Take 1-2 tablets (5-10 mg total) by mouth daily. 180 tablet 1   enoxaparin  (LOVENOX ) 40 MG/0.4ML injection Inject 1.2 mLs (120 mg total) into the skin every 12 (twelve) hours for 5 days. (Patient not taking: Reported on 03/19/2024) 12 mL 0   gabapentin  (NEURONTIN ) 300 MG capsule Take 1 capsule (300 mg total) by mouth at bedtime. (Patient not taking: Reported on 03/19/2024) 90 capsule 0   atorvastatin  (LIPITOR) 20 MG tablet Take 1 tablet (20 mg total) by mouth daily. (Patient not taking: Reported on 03/19/2024) 30 tablet 6   No facility-administered medications prior to visit.     ROS Review of Systems  Constitutional:  Negative for activity change and appetite change.  HENT:  Negative for sinus pressure and sore throat.   Respiratory:  Negative for chest tightness, shortness of breath and wheezing.   Cardiovascular:  Negative for chest pain and palpitations.  Gastrointestinal:  Negative for abdominal distention, abdominal pain and constipation.  Genitourinary: Negative.   Musculoskeletal:        See HPI  Psychiatric/Behavioral:  Negative for behavioral problems and dysphoric mood.     Objective:  BP 130/85   Pulse 96   Ht 5' 9 (1.753 m)   Wt (!) 316 lb (143.3 kg)   LMP 07/05/2019 (Exact Date)   SpO2 98%   BMI 46.67 kg/m      03/19/2024    2:16 PM 03/03/2024    7:45 PM 03/03/2024    7:30 PM  BP/Weight  Systolic BP 130 128 129  Diastolic BP 85 82 84  Wt. (Lbs) 316    BMI 46.67 kg/m2         Physical Exam Constitutional:      Appearance: She is well-developed. She is obese.  Cardiovascular:     Rate and Rhythm: Normal rate.     Heart sounds: Normal heart sounds. No murmur heard. Pulmonary:     Effort: Pulmonary effort is normal.     Breath sounds: Normal breath sounds. No wheezing or rales.  Chest:     Chest wall: No tenderness.  Abdominal:     General: Bowel sounds are normal. There is no distension.     Palpations: Abdomen is soft. There is no mass.     Tenderness: There is no abdominal tenderness.  Musculoskeletal:     Right lower  leg: No edema.     Left lower leg: No edema.     Comments: Tender induration in popliteal fossa of left knee. Crepitus on range of motion of bilateral knees  Neurological:     Mental Status: She is alert and oriented to person, place, and time.  Psychiatric:        Mood and Affect: Mood normal.        Latest Ref Rng & Units 03/03/2024    6:35 PM 05/01/2022    9:33 AM 11/28/2021    9:21 AM  CMP  Glucose 70 - 99 mg/dL 876  97  898   BUN 6 - 20 mg/dL 11  13  15    Creatinine 0.44 - 1.00 mg/dL 9.21  9.10  9.14   Sodium 135 - 145 mmol/L 137  137  138   Potassium 3.5 - 5.1 mmol/L 3.9  4.2  4.5   Chloride 98 - 111 mmol/L 104  104  105   CO2 22 - 32 mmol/L 21  22  21    Calcium  8.9 - 10.3 mg/dL 8.5  8.5  8.6   Total Protein 6.0 - 8.5 g/dL  6.1  6.5   Total Bilirubin 0.0 - 1.2 mg/dL  <9.7  0.2   Alkaline Phos 44 - 121 IU/L  76  73   AST 0 - 40 IU/L  18  14   ALT 0 - 32 IU/L  14  15     Lipid Panel     Component Value Date/Time   CHOL 229 (H) 05/01/2022 0933   TRIG 156 (H) 05/01/2022 0933   HDL 41 05/01/2022 0933   CHOLHDL 4.6 (H) 03/18/2019 1609   CHOLHDL 5.3 05/16/2013 0925   VLDL 36 05/16/2013 0925   LDLCALC 159 (H) 05/01/2022 0933    CBC    Component Value Date/Time   WBC 5.1 03/03/2024 1835   RBC 4.69 03/03/2024 1835   HGB 13.1 03/03/2024 1835   HGB 14.7 11/28/2021 0921   HGB 12.3 07/11/2017 1209   HCT  40.4 03/03/2024 1835   HCT 44.7 11/28/2021 0921   HCT 38.5 07/11/2017 1209   PLT 251 03/03/2024 1835   PLT 298 11/28/2021 0921   MCV 86.1 03/03/2024 1835   MCV 90 11/28/2021 0921   MCV 84.2 07/11/2017 1209   MCH 27.9 03/03/2024 1835   MCHC 32.4 03/03/2024 1835   RDW 15.7 (H) 03/03/2024 1835   RDW 13.8 11/28/2021 0921   RDW 16.7 (H) 07/11/2017 1209   LYMPHSABS 2.0 03/03/2024 1835   LYMPHSABS 1.7 11/28/2021 0921   LYMPHSABS 2.0 07/11/2017 1209   MONOABS 0.5 03/03/2024 1835   MONOABS 0.4 07/11/2017 1209   EOSABS 0.2 03/03/2024 1835   EOSABS 0.1 11/28/2021 0921   BASOSABS 0.1 03/03/2024 1835   BASOSABS 0.0 11/28/2021 0921   BASOSABS 0.0 07/11/2017 1209    Lab Results  Component Value Date   HGBA1C 5.4 11/28/2021   Lab Results  Component Value Date   INR 1.8 (A) 03/19/2024   INR 1.2 03/03/2024   INR 1.6 02/07/2024        Assessment & Plan Synovial cyst (Baker's cyst), left knee Baker's cyst causing discomfort, no blood clots, increased bleeding risk with anticoagulation. - Refer to orthopedics for evaluation and potential drainage. - Provide information sheet about Baker's cyst. - Prescribe Voltaren  gel for pain management, apply to back and front of knee.  Anticoagulation management for history of pulmonary embolism INR today slightly subtherapeutic at 1.8  Of note she only administered Lovenox  which she received from the ED for 2 days between 8/18 and 8/20 and self discontinued due to bruising. - Adjust warfarin to 10 mg on Tuesday, Wednesday, Thursday, and Friday, 5 mg on Monday. - Cancel tomorrow's appointment, schedule follow-up with Coumadin  clinic in three weeks.  Knee osteoarthritis Osteoarthritis in both knees with pain and popping, exacerbated by weight gain. - Recommend weight loss to alleviate symptoms. - Prescribe Voltaren  gel for pain management.  Pure hypercholesterolemia Non-adherence to atorvastatin , last cholesterol check in 2023, regular  monitoring needed. - Order fasting cholesterol test. - Encourage scheduling cholesterol test on same day as Coumadin  clinic visit.  Obesity Weight increased from 298 lbs to 316 lbs, attributed to decreased physical activity. - Encourage increased physical activity, such as walking, to aid in weight loss.     Healthcare maintenance Screening for breast cancer-mammogram ordered Screening for diabetes-A1c ordered Address colon cancer screening at next visit  Meds ordered this encounter  Medications   atorvastatin  (LIPITOR) 20 MG tablet    Sig: Take 1 tablet (20 mg total) by mouth daily.    Dispense:  30 tablet    Refill:  6   diclofenac  Sodium (VOLTAREN ) 1 % GEL    Sig: Apply 4 grams topically 4 (four) times daily.    Dispense:  100 g    Refill:  1    Follow-up: No follow-ups on file.       Corrina Sabin, MD, FAAFP. [facility], [place] [phone]   03/19/2024, 2:56 PM

## 2024-03-19 NOTE — Patient Instructions (Addendum)
 Take 5 mg of warfarin on Mondays and 10 mg on other days.  Baker Cyst  A Baker cyst, also called a popliteal cyst, is a growth that forms at the back of the knee. The cyst forms when the fluid-filled sac (bursa) behind the knee joint fills up with more fluid and becomes enlarged. What are the causes? In most cases, a Baker cyst results from another knee problem that causes swelling in the knee. This makes the fluid inside the knee joint (synovial fluid) flow into the bursa behind the knee, causing the bursa to enlarge. The fluid flows in one direction and is unable to move back into the joint. What increases the risk? You may be more likely to develop a Baker cyst if you already have a knee problem, such as: A tear in cartilage that cushions the knee joint (meniscal tear). A tear in the tissues that connect the bones of the knee joint (ligament tear). Knee swelling from osteoarthritis, rheumatoid arthritis, or gout. What are the signs or symptoms? The main symptom of this condition is a lump behind the knee. This may be the only symptom of the condition. The lump may be painful, especially when the knee is straightened. If the lump is painful, the pain may come and go. The knee may also be stiff. Symptoms may quickly get more severe if the cyst breaks open. If the cyst breaks open, you may feel the following in your knee and calf: Sudden or worsening pain. Swelling. Bruising. Redness in the calf. A Baker cyst does not always cause symptoms. How is this diagnosed? This condition may be diagnosed based on your symptoms and medical history. Your health care provider will also do a physical exam. This may include: Feeling the cyst to check whether it is tender. Checking your knee for signs of another knee condition that causes swelling. You may have imaging tests, such as X-ray, ultrasound, or MRI. How is this treated? A Baker cyst that is not painful may go away without treatment. If the cyst  gets large or painful, it will likely get better if the underlying knee problem is treated. If needed, treatment for a Baker cyst may include: Resting. Keeping weight off the knee. This means not leaning on the knee to support your body weight. Taking NSAIDs, such as ibuprofen , to reduce pain and swelling. Draining the fluid from the cyst with a needle (aspiration). Getting a steroid injection into the knee. A steroid reduces swelling. Surgery. This may be needed if other treatments do not work. This usually involves correcting knee damage and removing the cyst. In some cases, you may also need to do certain exercises to improve movement in the knee (physical therapy). Follow these instructions at home: Activity Rest as told by your provider. Avoid activities that make pain or swelling worse. Do not use the injured limb to support your body weight until your provider says that you can. Use crutches as told by your provider. Raise (elevate) your knee above the level of your heart while you are sitting or lying down. Do physical therapy exercises as told by your provider. Return to your normal activities as told by your provider. Ask your provider what activities are safe for you. General instructions Take over-the-counter and prescription medicines only as told by your provider. Keep all follow-up visits. Your provider will monitor your healing and adjust your treatment as needed. Contact a health care provider if: You have knee pain, stiffness, or swelling that does not  get better. Get help right away if: You have sudden or worsening pain and swelling in your calf area. This information is not intended to replace advice given to you by your health care provider. Make sure you discuss any questions you have with your health care provider. Document Revised: 03/01/2022 Document Reviewed: 03/01/2022 Elsevier Patient Education  2024 [employer].

## 2024-03-20 ENCOUNTER — Ambulatory Visit: Admitting: Pharmacist

## 2024-04-03 ENCOUNTER — Ambulatory Visit: Admitting: Physician Assistant

## 2024-04-03 DIAGNOSIS — M1712 Unilateral primary osteoarthritis, left knee: Secondary | ICD-10-CM

## 2024-04-03 DIAGNOSIS — M1711 Unilateral primary osteoarthritis, right knee: Secondary | ICD-10-CM

## 2024-04-03 DIAGNOSIS — M170 Bilateral primary osteoarthritis of knee: Secondary | ICD-10-CM | POA: Diagnosis not present

## 2024-04-03 MED ORDER — LIDOCAINE HCL 1 % IJ SOLN
3.0000 mL | INTRAMUSCULAR | Status: AC | PRN
Start: 2024-04-03 — End: 2024-04-03
  Administered 2024-04-03: 3 mL

## 2024-04-03 MED ORDER — METHYLPREDNISOLONE ACETATE 40 MG/ML IJ SUSP
13.3300 mg | INTRAMUSCULAR | Status: AC | PRN
Start: 2024-04-03 — End: 2024-04-03
  Administered 2024-04-03: 13.33 mg via INTRA_ARTICULAR

## 2024-04-03 MED ORDER — BUPIVACAINE HCL 0.25 % IJ SOLN
0.6600 mL | INTRAMUSCULAR | Status: AC | PRN
Start: 2024-04-03 — End: 2024-04-03
  Administered 2024-04-03: .66 mL via INTRA_ARTICULAR

## 2024-04-03 NOTE — Progress Notes (Signed)
 Office Visit Note   Patient: Donna [name]           Date of Birth: 06/14/1971           MRN: [id number] Visit Date: 04/03/2024              Requested by: Delbert Clam, MD [address],  [place] [address] PCP: Delbert Clam, MD   Assessment & Plan: Visit Diagnoses:  1. Unilateral primary osteoarthritis, left knee   2. Unilateral primary osteoarthritis, right knee     Plan: Impression is bilateral knee osteoarthritis.  Today, we discussed various treatment options to include cortisone injection versus viscosupplementation injection versus total knee arthroplasty.  She would like to first try cortisone injections.  She will work on weight loss as she will eventually need knee replacement surgery at some point in the future.  Call with concerns or questions.  Follow-Up Instructions: Return if symptoms worsen or fail to improve.   Orders:  Orders Placed This Encounter  Procedures   Large Joint Inj   No orders of the defined types were placed in this encounter.     Procedures: Large Joint Inj: bilateral knee on 04/03/2024 8:59 AM Indications: pain Details: 22 G needle, anterolateral approach Medications (Right): 0.66 mL bupivacaine  0.25 %; 3 mL lidocaine  1 %; 13.33 mg methylPREDNISolone  acetate 40 MG/ML Medications (Left): 0.66 mL bupivacaine  0.25 %; 3 mL lidocaine  1 %; 13.33 mg methylPREDNISolone  acetate 40 MG/ML      Clinical Data: No additional findings.   Subjective: Chief Complaint  Patient presents with   Left Knee - Pain    HPI patient is a pleasant 53 year old female who comes in today with bilateral knee pain left greater than right.  Symptoms have been on and off for a while but have worsened over the past month.  She denies any injury.  The pain she has to the left knee is the popliteal fossa into the right knee to the anteromedial aspect.  She notes intermittent swelling to both knees.  Activity such as walking seems to  aggravate her symptoms most.  She has tried Tylenol  and Voltaren  gel without significant relief.  She is unable to take NSAIDs as she is on Coumadin .  No previous cortisone injection to either knee.  Review of Systems As detailed in HPI.  All others reviewed and are negative.  Objective: Vital Signs: LMP 07/05/2019 (Exact Date)   Physical Exam well-developed well-nourished female in no acute distress.  Alert and oriented x 3.  Ortho Exam left knee exam: Range of motion 0 to 110 degrees.  Mild tenderness to popliteal fossa.  Calf soft and nontender.  Moderate patellofemoral crepitus.  She is neurovascular intact distally.  Right knee exam: Moderate tenderness along the medial joint line.  Range of motion 0 to 110 degrees.  Moderate patellofemoral crepitus.  She is neurovasc intact distally.  Specialty Comments:  No specialty comments available.  Imaging: No new imaging   PMFS History: Patient Active Problem List   Diagnosis Date Noted   Overactive bladder 05/01/2022   Stress incontinence 11/28/2021   Hyperlipidemia 10/17/2017   Hypercoagulable state (HCC) 09/11/2017   Personal history of venous thrombosis and embolism 08/01/2015   Iron deficiency anemia 07/16/2014   Pleuritic chest pain 07/16/2014   High risk medication use 02/12/2013   Chronic anticoagulation 02/12/2013   Healthcare maintenance 02/12/2013   Class 3 severe obesity due to excess calories without serious comorbidity with body mass index (  BMI) of 40.0 to 44.9 in adult    History of pulmonary embolism 12/05/2012   Anemia 12/05/2012   Past Medical History:  Diagnosis Date   DVT (deep venous thrombosis) (HCC) 12/06/2012   Lung collapse    PONV (postoperative nausea and vomiting)    Pulmonary embolism affecting pregnancy     Family History  Problem Relation Age of Onset   COPD Father    Stroke Father     Past Surgical History:  Procedure Laterality Date   HYSTERECTOMY ABDOMINAL WITH SALPINGECTOMY Bilateral  07/15/2019   Procedure: HYSTERECTOMY ABDOMINAL WITH SALPINGECTOMY;  Surgeon: Eveline Lynwood MATSU, MD;  Location: [facility] OR;  Service: Gynecology;  Laterality: Bilateral;   RIB RESECTION     Social History   Occupational History   Not on file  Tobacco Use   Smoking status: Never   Smokeless tobacco: Never  Vaping Use   Vaping status: Never Used  Substance and Sexual Activity   Alcohol use: Yes    Comment: socially    Drug use: No   Sexual activity: Not on file

## 2024-04-23 ENCOUNTER — Telehealth: Payer: Self-pay | Admitting: Family Medicine

## 2024-04-23 NOTE — Telephone Encounter (Signed)
 Patient confirmed appointment for 04/25/2024 through volunteer call.

## 2024-04-25 ENCOUNTER — Ambulatory Visit: Admitting: Pharmacist

## 2024-04-25 ENCOUNTER — Telehealth: Payer: Self-pay | Admitting: Pharmacist

## 2024-04-25 NOTE — Telephone Encounter (Signed)
 Can we call this patient so she can reschedule her Coumadin  Clinic visit with me?

## 2024-05-19 ENCOUNTER — Encounter: Payer: Self-pay | Admitting: Radiology

## 2024-05-20 ENCOUNTER — Ambulatory Visit: Attending: Family Medicine | Admitting: Pharmacist

## 2024-05-20 ENCOUNTER — Other Ambulatory Visit: Payer: Self-pay

## 2024-05-20 DIAGNOSIS — Z7901 Long term (current) use of anticoagulants: Secondary | ICD-10-CM | POA: Diagnosis not present

## 2024-05-20 LAB — POCT INR: POC INR: 1.3

## 2024-05-20 MED ORDER — WARFARIN SODIUM 5 MG PO TABS
5.0000 mg | ORAL_TABLET | Freq: Every day | ORAL | 1 refills | Status: AC
  Filled 2024-05-20: qty 180, 90d supply, fill #0

## 2024-05-23 ENCOUNTER — Other Ambulatory Visit: Payer: Self-pay

## 2024-05-26 ENCOUNTER — Other Ambulatory Visit (HOSPITAL_BASED_OUTPATIENT_CLINIC_OR_DEPARTMENT_OTHER): Payer: Self-pay

## 2024-07-15 ENCOUNTER — Telehealth: Payer: Self-pay | Admitting: Pharmacist

## 2024-07-15 NOTE — Telephone Encounter (Signed)
 Copied from CRM [id number]. Topic: Appointments - Scheduling Inquiry for Clinic >> Jul 14, 2024  4:55 PM Tobias CROME wrote: Reason for CRM: Patient requesting appointment with Herlene.   Best callback: [phone]

## 2024-07-17 ENCOUNTER — Encounter: Payer: Self-pay | Admitting: Hematology

## 2024-07-24 ENCOUNTER — Ambulatory Visit: Payer: Self-pay | Attending: Family Medicine | Admitting: Pharmacist

## 2024-07-24 DIAGNOSIS — Z7901 Long term (current) use of anticoagulants: Secondary | ICD-10-CM

## 2024-07-24 LAB — POCT INR: POC INR: 3.1

## 2024-08-25 ENCOUNTER — Ambulatory Visit: Payer: Self-pay | Admitting: Pharmacist

## 2024-09-16 ENCOUNTER — Ambulatory Visit: Admitting: Family Medicine
